# Patient Record
Sex: Female | Born: 1975 | Race: Black or African American | Hispanic: No | Marital: Single | Smoking: Former smoker
Health system: Southern US, Community
[De-identification: ages and names within clinical notes are randomized; demographics above are authoritative.]

## PROBLEM LIST (undated history)

## (undated) ENCOUNTER — Ambulatory Visit: Admission: EM | Payer: No Typology Code available for payment source | Source: Home / Self Care

## (undated) DIAGNOSIS — O00109 Unspecified tubal pregnancy without intrauterine pregnancy: Secondary | ICD-10-CM

## (undated) DIAGNOSIS — A6000 Herpesviral infection of urogenital system, unspecified: Secondary | ICD-10-CM

## (undated) DIAGNOSIS — J302 Other seasonal allergic rhinitis: Secondary | ICD-10-CM

## (undated) DIAGNOSIS — F32A Depression, unspecified: Secondary | ICD-10-CM

## (undated) DIAGNOSIS — J45909 Unspecified asthma, uncomplicated: Secondary | ICD-10-CM

## (undated) DIAGNOSIS — F419 Anxiety disorder, unspecified: Secondary | ICD-10-CM

## (undated) DIAGNOSIS — E7800 Pure hypercholesterolemia, unspecified: Secondary | ICD-10-CM

## (undated) HISTORY — DX: Depression, unspecified: F32.A

## (undated) HISTORY — DX: Other seasonal allergic rhinitis: J30.2

## (undated) HISTORY — PX: WISDOM TOOTH EXTRACTION: SHX21

## (undated) HISTORY — PX: OTHER SURGICAL HISTORY: SHX169

## (undated) HISTORY — DX: Anxiety disorder, unspecified: F41.9

---
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
## 1997-07-08 ENCOUNTER — Observation Stay (HOSPITAL_COMMUNITY): Admission: EM | Admit: 1997-07-08 | Discharge: 1997-07-09 | Payer: Self-pay | Admitting: *Deleted

## 1998-06-10 ENCOUNTER — Inpatient Hospital Stay (HOSPITAL_COMMUNITY): Admission: AD | Admit: 1998-06-10 | Discharge: 1998-06-10 | Payer: Self-pay | Admitting: Obstetrics

## 1998-07-22 ENCOUNTER — Inpatient Hospital Stay (HOSPITAL_COMMUNITY): Admission: AD | Admit: 1998-07-22 | Discharge: 1998-07-22 | Payer: Self-pay | Admitting: Obstetrics

## 1998-07-23 ENCOUNTER — Inpatient Hospital Stay (HOSPITAL_COMMUNITY): Admission: RE | Admit: 1998-07-23 | Discharge: 1998-07-23 | Payer: Self-pay | Admitting: Obstetrics

## 1998-10-03 ENCOUNTER — Inpatient Hospital Stay (HOSPITAL_COMMUNITY): Admission: AD | Admit: 1998-10-03 | Discharge: 1998-10-03 | Payer: Self-pay | Admitting: *Deleted

## 1998-10-11 ENCOUNTER — Inpatient Hospital Stay (HOSPITAL_COMMUNITY): Admission: AD | Admit: 1998-10-11 | Discharge: 1998-10-13 | Payer: Self-pay | Admitting: Obstetrics & Gynecology

## 1998-11-02 ENCOUNTER — Inpatient Hospital Stay (HOSPITAL_COMMUNITY): Admission: AD | Admit: 1998-11-02 | Discharge: 1998-11-04 | Payer: Self-pay | Admitting: *Deleted

## 2001-02-20 HISTORY — PX: OVARIAN CYST REMOVAL: SHX89

## 2004-12-28 ENCOUNTER — Emergency Department: Payer: Self-pay | Admitting: Internal Medicine

## 2005-07-06 ENCOUNTER — Emergency Department: Payer: Self-pay | Admitting: General Practice

## 2005-07-06 ENCOUNTER — Other Ambulatory Visit: Payer: Self-pay

## 2005-07-06 ENCOUNTER — Emergency Department: Payer: Self-pay | Admitting: Unknown Physician Specialty

## 2005-11-17 ENCOUNTER — Emergency Department: Payer: Self-pay | Admitting: Emergency Medicine

## 2005-11-26 ENCOUNTER — Emergency Department: Payer: Self-pay | Admitting: Unknown Physician Specialty

## 2006-04-20 ENCOUNTER — Emergency Department: Payer: Self-pay | Admitting: Emergency Medicine

## 2006-12-12 ENCOUNTER — Emergency Department: Payer: Self-pay | Admitting: Emergency Medicine

## 2007-05-25 ENCOUNTER — Emergency Department (HOSPITAL_COMMUNITY): Admission: EM | Admit: 2007-05-25 | Discharge: 2007-05-25 | Payer: Self-pay | Admitting: Emergency Medicine

## 2008-09-29 ENCOUNTER — Emergency Department: Payer: Self-pay | Admitting: Emergency Medicine

## 2008-10-01 ENCOUNTER — Emergency Department (HOSPITAL_COMMUNITY): Admission: EM | Admit: 2008-10-01 | Discharge: 2008-10-01 | Payer: Self-pay | Admitting: Emergency Medicine

## 2010-11-15 LAB — URINALYSIS, ROUTINE W REFLEX MICROSCOPIC
Bilirubin Urine: NEGATIVE
Ketones, ur: 15 — AB
Protein, ur: 30 — AB
Urobilinogen, UA: 1

## 2010-11-15 LAB — URINE MICROSCOPIC-ADD ON

## 2010-11-15 LAB — DIFFERENTIAL
Basophils Absolute: 0
Basophils Relative: 0
Eosinophils Absolute: 0.6
Monocytes Absolute: 0.5
Neutro Abs: 5.9

## 2010-11-15 LAB — CBC
Hemoglobin: 14
MCHC: 33.8
RDW: 13.1

## 2010-11-15 LAB — RAPID URINE DRUG SCREEN, HOSP PERFORMED
Barbiturates: NOT DETECTED
Benzodiazepines: NOT DETECTED
Cocaine: NOT DETECTED
Opiates: NOT DETECTED

## 2010-11-15 LAB — POCT CARDIAC MARKERS
CKMB, poc: 1.9
Myoglobin, poc: 83.2

## 2013-09-05 ENCOUNTER — Emergency Department: Payer: Self-pay | Admitting: Emergency Medicine

## 2013-11-01 ENCOUNTER — Emergency Department: Payer: Self-pay | Admitting: Emergency Medicine

## 2014-01-15 ENCOUNTER — Emergency Department: Payer: Self-pay | Admitting: Emergency Medicine

## 2014-03-02 ENCOUNTER — Emergency Department: Payer: Self-pay | Admitting: Emergency Medicine

## 2014-04-12 ENCOUNTER — Emergency Department: Payer: Self-pay | Admitting: Emergency Medicine

## 2014-04-13 ENCOUNTER — Emergency Department: Payer: Self-pay | Admitting: Emergency Medicine

## 2014-06-14 ENCOUNTER — Emergency Department
Admit: 2014-06-14 | Disposition: A | Discharge status: Home / Self Care | Payer: Self-pay | Admitting: Emergency Medicine

## 2014-08-13 ENCOUNTER — Encounter: Payer: Self-pay | Admitting: *Deleted

## 2014-08-13 ENCOUNTER — Emergency Department
Admission: EM | Admit: 2014-08-13 | Discharge: 2014-08-13 | Disposition: A | Discharge status: Home / Self Care | Payer: Medicaid Other | Attending: Student | Admitting: Student

## 2014-08-13 DIAGNOSIS — Z720 Tobacco use: Secondary | ICD-10-CM | POA: Diagnosis not present

## 2014-08-13 DIAGNOSIS — N898 Other specified noninflammatory disorders of vagina: Secondary | ICD-10-CM | POA: Diagnosis present

## 2014-08-13 DIAGNOSIS — Z79899 Other long term (current) drug therapy: Secondary | ICD-10-CM | POA: Diagnosis not present

## 2014-08-13 DIAGNOSIS — N760 Acute vaginitis: Secondary | ICD-10-CM | POA: Diagnosis not present

## 2014-08-13 LAB — URINALYSIS COMPLETE WITH MICROSCOPIC (ARMC ONLY)
Bacteria, UA: NONE SEEN
Bilirubin Urine: NEGATIVE
GLUCOSE, UA: NEGATIVE mg/dL
KETONES UR: NEGATIVE mg/dL
Nitrite: NEGATIVE
Protein, ur: NEGATIVE mg/dL
SPECIFIC GRAVITY, URINE: 1.003 — AB (ref 1.005–1.030)
pH: 7 (ref 5.0–8.0)

## 2014-08-13 LAB — WET PREP, GENITAL
Clue Cells Wet Prep HPF POC: NONE SEEN
Trich, Wet Prep: NONE SEEN
Yeast Wet Prep HPF POC: NONE SEEN

## 2014-08-13 LAB — CHLAMYDIA/NGC RT PCR (ARMC ONLY)
Chlamydia Tr: NOT DETECTED
N gonorrhoeae: NOT DETECTED

## 2014-08-13 MED ORDER — METRONIDAZOLE 500 MG PO TABS: 500.0000 mg | ORAL_TABLET | Freq: Two times a day (BID) | ORAL | Status: AC

## 2014-08-13 NOTE — ED Notes (Signed)
Pt states odordous vaginal discharge white in color since Monday, pt states a yeast infection 1 month ago

## 2014-08-13 NOTE — ED Notes (Signed)
Pt reports having a white vaginal discharge with odor since Monday

## 2014-08-13 NOTE — ED Notes (Signed)
Pt alert and oriented X4, active, cooperative, pt in NAD. RR even and unlabored, color WNL.  Pt informed to return if any life threatening symptoms occur.   

## 2014-08-13 NOTE — ED Provider Notes (Signed)
[facility] Emergency Department Provider Note  ____________________________________________  Time seen: Approximately 4:49 PM I have reviewed the triage vital signs and the nursing notes.   HISTORY  Chief Complaint Vaginal Discharge   HPI Robyn Barker is a 39 y.o. female who presents to the ER for vaginal discharge. She has not had a new partner, but states she suspects that he may have had more than 1 partner recently. She reports a foul, white vaginal discharge. She denies abdominal pain, nausea, vomiting, or other concerns.  History reviewed. No pertinent past medical history.  There are no active problems to display for this patient.   History reviewed. No pertinent past surgical history.  Current Outpatient Rx  Name  Route  Sig  Dispense  Refill  . metroNIDAZOLE (FLAGYL) 500 MG tablet   Oral   Take 1 tablet (500 mg total) by mouth 2 (two) times daily.   30 tablet   0     Allergies Review of patient's allergies indicates no known allergies.  No family history on file.  Social History History  Substance Use Topics  . Smoking status: Current Every Day Smoker -- 1.00 packs/day  . Smokeless tobacco: Not on file  . Alcohol Use: No    Review of Systems Constitutional: No fever/chills Cardiovascular: Denies chest pain. Respiratory: Denies shortness of breath or cough. Gastrointestinal: Abdominal pain no., nausea no, vomitingno. Genitourinary: Dysuria no, vaginal discharge yes.. Musculoskeletal: Negative for back pain. Skin: Negative for rash. Neurological: Negative for headaches, focal weakness or numbness.  10-point ROS otherwise negative.  ____________________________________________   PHYSICAL EXAM:  VITAL SIGNS: ED Triage Vitals  Enc Vitals Group     BP 08/13/14 1642 124/72 mmHg     Pulse Rate 08/13/14 1642 78     Resp --      Temp 08/13/14 1642 98.2 F (36.8 C)     Temp Source 08/13/14 1642 Oral     SpO2 08/13/14  1642 99 %     Weight 08/13/14 1642 180 lb (81.647 kg)     Height 08/13/14 1642 5\' 1"  (1.549 m)     Head Cir --      Peak Flow --      Pain Score --      Pain Loc --      Pain Edu? --      Excl. in GC? --     Constitutional: Alert and oriented. Well appearing and in no acute distress. Eyes: Conjunctivae are normal. PERRL. EOMI. Head: Atraumatic. Nose: No congestion/rhinnorhea. Mouth/Throat: Mucous membranes are moist.  Oropharynx non-erythematous. Neck: No stridor. Cardiovascular: Good peripheral circulation. Respiratory: Normal respiratory effort.  No retractions. Gastrointestinal: Soft and nontender. No distention. No abdominal bruits. Genitourinary: Pelvic exam:  Cervix closed, no blood in the vault or from cervix, white discharge present in the vaginal vault, no CMT, no adenexal tenderness, no obvious cervicitis; exterior exam unremarkable Musculoskeletal: No extremity tenderness nor edema.  Neurologic:  Normal speech and language. No gross focal neurologic deficits are appreciated. Speech is normal. No gait instability. Skin:  Skin is warm, dry and intact. No rash noted. Psychiatric: Mood and affect are normal. Speech and behavior are normal.  ____________________________________________   LABS (all labs ordered are listed, but only abnormal results are displayed)  Labs Reviewed  WET PREP, GENITAL - Abnormal; Notable for the following:    WBC, Wet Prep HPF POC MODERATE (*)    All other components within normal limits  URINALYSIS COMPLETEWITH MICROSCOPIC [facility]  ONLY) - Abnormal; Notable for the following:    Color, Urine COLORLESS (*)    APPearance CLEAR (*)    Specific Gravity, Urine 1.003 (*)    Hgb urine dipstick 1+ (*)    Leukocytes, UA 1+ (*)    Squamous Epithelial / LPF 0-5 (*)    All other components within normal limits  CHLAMYDIA/NGC RT PCR ([facility] ONLY)  POC URINE PREG, ED   ____________________________________________  RADIOLOGY  Not  indicated ____________________________________________   PROCEDURES  Procedure(s) performed: Pelvic exam--see assessment  ____________________________________________   INITIAL IMPRESSION / ASSESSMENT AND PLAN / ED COURSE  Pertinent labs & imaging results that were available during my care of the patient were reviewed by me and considered in my medical decision making (see chart for details).  Will await wet prep today. Low concern for GC/Chlamydia.   Results of wet prep were discussed with the patient. She will follow up with the OB/GYN or [facility] Department for symptoms that are not improving with Flagyl.  ____________________________________________   FINAL CLINICAL IMPRESSION(S) / ED DIAGNOSES  Final diagnoses:  Acute vaginitis      [name], FNP 08/13/14 2207  Gayla Doss, MD 08/14/14 0005

## 2014-09-16 ENCOUNTER — Emergency Department
Admission: EM | Admit: 2014-09-16 | Discharge: 2014-09-16 | Disposition: A | Discharge status: Home / Self Care | Payer: Medicaid Other | Attending: Emergency Medicine | Admitting: Emergency Medicine

## 2014-09-16 ENCOUNTER — Encounter: Payer: Self-pay | Admitting: Emergency Medicine

## 2014-09-16 DIAGNOSIS — Z720 Tobacco use: Secondary | ICD-10-CM | POA: Insufficient documentation

## 2014-09-16 DIAGNOSIS — Z113 Encounter for screening for infections with a predominantly sexual mode of transmission: Secondary | ICD-10-CM | POA: Diagnosis not present

## 2014-09-16 DIAGNOSIS — A64 Unspecified sexually transmitted disease: Secondary | ICD-10-CM | POA: Diagnosis present

## 2014-09-16 LAB — URINALYSIS COMPLETE WITH MICROSCOPIC (ARMC ONLY)
Bilirubin Urine: NEGATIVE
GLUCOSE, UA: NEGATIVE mg/dL
KETONES UR: NEGATIVE mg/dL
Nitrite: NEGATIVE
Protein, ur: NEGATIVE mg/dL
SPECIFIC GRAVITY, URINE: 1.026 (ref 1.005–1.030)
pH: 5 (ref 5.0–8.0)

## 2014-09-16 LAB — PREGNANCY, URINE: Preg Test, Ur: NEGATIVE

## 2014-09-16 LAB — CHLAMYDIA/NGC RT PCR (ARMC ONLY)
CHLAMYDIA TR: NOT DETECTED
N gonorrhoeae: NOT DETECTED

## 2014-09-16 NOTE — ED Provider Notes (Addendum)
[facility] Emergency Department Provider Note  ____________________________________________  Time seen: Approximately 5:21 AM  I have reviewed the triage vital signs and the nursing notes.   HISTORY  Chief Complaint SEXUALLY TRANSMITTED DISEASE  Chief complaint from the patient is that her boyfriend states that another woman that he is been with may have chlamydia.   HPI ROSSELYN Barker is a 39 y.o. female denies any medical history. She states that she has not had any medical problems but comes to the ER for evaluation because her boyfriend said that he heard from a friend that another woman that he is with might have chlamydia.  No nausea vomiting. No fevers or chills. Patient does not believe she is pregnant. She is not a vaginal discharge or abdominal pain or other concerns. She states she feels fine and just wants to be tested and only wants treatment if positive.   History reviewed. No pertinent past medical history.  There are no active problems to display for this patient.   History reviewed. No pertinent past surgical history.  No current outpatient prescriptions on file.  Allergies Review of patient's allergies indicates no known allergies.  No family history on file.  Social History History  Substance Use Topics  . Smoking status: Current Every Day Smoker -- 1.00 packs/day  . Smokeless tobacco: Not on file  . Alcohol Use: No    Review of Systems Constitutional: No fever/chills ENT: No sore throat. Cardiovascular: Denies chest pain. Respiratory: Denies shortness of breath. Gastrointestinal: No abdominal pain.  No nausea, no vomiting.  No diarrhea.  No constipation. Genitourinary: Negative for dysuria. Musculoskeletal: Negative for back pain. Skin: Negative for rash.  10-point ROS otherwise negative.  ____________________________________________   PHYSICAL EXAM:  VITAL SIGNS: ED Triage Vitals  Enc Vitals Group     BP  09/16/14 0433 131/90 mmHg     Pulse Rate 09/16/14 0433 86     Resp 09/16/14 0433 18     Temp 09/16/14 0433 98.1 F (36.7 C)     Temp src --      SpO2 09/16/14 0433 99 %     Weight 09/16/14 0433 179 lb (81.194 kg)     Height 09/16/14 0433 5\' 1"  (1.549 m)     Head Cir --      Peak Flow --      Pain Score --      Pain Loc --      Pain Edu? --      Excl. in GC? --     Constitutional: Alert and oriented. Well appearing and in no acute distress. Eyes: Conjunctivae are normal. PERRL. EOMI. Head: Atraumatic. Nose: No congestion/rhinnorhea. Mouth/Throat: Mucous membranes are moist.  Oropharynx non-erythematous. Neck: No stridor.   Cardiovascular: Normal rate, regular rhythm. Grossly normal heart sounds.  Good peripheral circulation. Respiratory: Normal respiratory effort.  No retractions. Lungs CTAB. Gastrointestinal: Soft and nontender. No distention. No abdominal bruits. No CVA tenderness. Musculoskeletal: No lower extremity tenderness nor edema.  No joint effusions. Neurologic:  Normal speech and language. No gross focal neurologic deficits are appreciated. No gait instability. Skin:  Skin is warm, dry and intact. No rash noted. Psychiatric: Mood and affect are normal. Speech and behavior are normal.  ____________________________________________   LABS (all labs ordered are listed, but only abnormal results are displayed)  Labs Reviewed  URINALYSIS COMPLETEWITH MICROSCOPIC ([facility] ONLY) - Abnormal; Notable for the following:    Color, Urine YELLOW (*)    APPearance CLEAR (*)  Hgb urine dipstick 3+ (*)    Leukocytes, UA TRACE (*)    Bacteria, UA RARE (*)    Squamous Epithelial / LPF 0-5 (*)    All other components within normal limits  CHLAMYDIA/NGC RT PCR ([facility] ONLY)  PREGNANCY, URINE    ____________________________________________  EKG   ____________________________________________  RADIOLOGY   ____________________________________________   PROCEDURES  Procedure(s) performed: None  Critical Care performed: No  ____________________________________________   INITIAL IMPRESSION / ASSESSMENT AND PLAN / ED COURSE  Pertinent labs & imaging results that were available during my care of the patient were reviewed by me and considered in my medical decision making (see chart for details).  Patient presents asymptomatic, afebrile for STI evaluation. I discussed with the patient compared treatment because of risk factors and possible exposure, but the patient states that she will not be treated unless she has a positive test for Chlamydia. We discussed the risks and benefits of treatment of antibiotic's, we also discussed the risks of pelvic inflammatory disease, and other severe infections which can result from untreated Chlamydia and that our test alone is not perfect and could miss an infection.  Patient states she is willing to give a urine specimen and we will check a dirty urine for GC. She has given [place] her phone number for follow-up, and should her test be positive she'll return for treatment. We discussed STI prevention and risks of sexual transmitted infections as well as return precautions. Patient is agreeable with the plan.  Again I strongly discussed and recommended treatment based on the history she isn't prevalence of chlamydia and gonorrhea and her population, but she will not undergo treatment unless she has a positive test. It seems reasonable to at least test her, rather than not treat at all so we will send a test and she will receive follow-up should her test be positive.  I discussed that she should follow-up with the [facility] health Department this week for further testing and follow-up. She is agreeable with this plan. Have given her referral  information to the [facility] health Department.  UPT negative. Discharged home. ____________________________________________   FINAL CLINICAL IMPRESSION(S) / ED DIAGNOSES  Final diagnoses:  Screening for STDs (sexually transmitted diseases)      Sharyn Creamer, MD 09/16/14 1610  Sharyn Creamer, MD 09/16/14 [phone]

## 2014-09-16 NOTE — ED Notes (Signed)
Patient ambulatory to triage with steady gait, without difficulty or distress noted; pt reports sexual partner recently dx with chlamydia; pt denies any symptoms

## 2014-09-16 NOTE — ED Notes (Signed)
Pt presents to ED for evaluation d/t recent exposure to STD per partner. Pt reports no symptoms, but was told last Sunday by her sexual partner that she needs to be checked. Pt is A&O, in NAD.

## 2014-09-16 NOTE — Discharge Instructions (Signed)
You have given [place] a sample to check for sexually transmitted infection. We will contact you if her test is positive. Please follow-up with the [facility] health Department within the next 1-2 days for further testing. Our tests are not comprehensive, and you could still have a sexually transmitted infection even if our test is negative so please return right away if he develop any fevers, chills, vaginal discharge, abdominal pain, or other new concerns arise.

## 2014-09-30 ENCOUNTER — Emergency Department (HOSPITAL_COMMUNITY)
Admission: EM | Admit: 2014-09-30 | Discharge: 2014-09-30 | Disposition: A | Discharge status: Home / Self Care | Payer: Medicaid Other | Attending: Emergency Medicine | Admitting: Emergency Medicine

## 2014-09-30 ENCOUNTER — Encounter (HOSPITAL_COMMUNITY): Payer: Self-pay | Admitting: *Deleted

## 2014-09-30 DIAGNOSIS — Z720 Tobacco use: Secondary | ICD-10-CM | POA: Diagnosis not present

## 2014-09-30 DIAGNOSIS — R1032 Left lower quadrant pain: Secondary | ICD-10-CM | POA: Diagnosis present

## 2014-09-30 DIAGNOSIS — Z3202 Encounter for pregnancy test, result negative: Secondary | ICD-10-CM | POA: Insufficient documentation

## 2014-09-30 DIAGNOSIS — R109 Unspecified abdominal pain: Secondary | ICD-10-CM

## 2014-09-30 LAB — BASIC METABOLIC PANEL
Anion gap: 8 (ref 5–15)
BUN: 7 mg/dL (ref 6–20)
CHLORIDE: 104 mmol/L (ref 101–111)
CO2: 25 mmol/L (ref 22–32)
CREATININE: 0.86 mg/dL (ref 0.44–1.00)
Calcium: 9.1 mg/dL (ref 8.9–10.3)
GFR calc Af Amer: >60 mL/min (ref >60)
GFR calc non Af Amer: >60 mL/min (ref >60)
GLUCOSE: 94 mg/dL (ref 65–99)
Potassium: 3.8 mmol/L (ref 3.5–5.1)
Sodium: 137 mmol/L (ref 135–145)

## 2014-09-30 LAB — URINALYSIS, ROUTINE W REFLEX MICROSCOPIC
Bilirubin Urine: NEGATIVE
GLUCOSE, UA: NEGATIVE mg/dL
KETONES UR: NEGATIVE mg/dL
Leukocytes, UA: NEGATIVE
Nitrite: NEGATIVE
PH: 5.5 (ref 5.0–8.0)
PROTEIN: NEGATIVE mg/dL
Specific Gravity, Urine: 1.015 (ref 1.005–1.030)
Urobilinogen, UA: 0.2 mg/dL (ref 0.0–1.0)

## 2014-09-30 LAB — CBC WITH DIFFERENTIAL/PLATELET
Basophils Absolute: 0.1 10*3/uL (ref 0.0–0.1)
Basophils Relative: 1 % (ref 0–1)
EOS ABS: 0.6 10*3/uL (ref 0.0–0.7)
EOS PCT: 5 % (ref 0–5)
HEMATOCRIT: 42.3 % (ref 36.0–46.0)
HEMOGLOBIN: 14.1 g/dL (ref 12.0–15.0)
LYMPHS ABS: 4 10*3/uL (ref 0.7–4.0)
Lymphocytes Relative: 35 % (ref 12–46)
MCH: 30.1 pg (ref 26.0–34.0)
MCHC: 33.3 g/dL (ref 30.0–36.0)
MCV: 90.4 fL (ref 78.0–100.0)
MONO ABS: 0.8 10*3/uL (ref 0.1–1.0)
MONOS PCT: 7 % (ref 3–12)
Neutro Abs: 6.2 10*3/uL (ref 1.7–7.7)
Neutrophils Relative %: 52 % (ref 43–77)
Platelets: 304 10*3/uL (ref 150–400)
RBC: 4.68 MIL/uL (ref 3.87–5.11)
RDW: 13.2 % (ref 11.5–15.5)
WBC: 11.6 10*3/uL — ABNORMAL HIGH (ref 4.0–10.5)

## 2014-09-30 LAB — HIV ANTIBODY (ROUTINE TESTING W REFLEX): HIV SCREEN 4TH GENERATION: NONREACTIVE

## 2014-09-30 LAB — URINE MICROSCOPIC-ADD ON

## 2014-09-30 LAB — GC/CHLAMYDIA PROBE AMP (~~LOC~~) NOT AT ARMC
Chlamydia: NEGATIVE
NEISSERIA GONORRHEA: NEGATIVE

## 2014-09-30 LAB — LIPASE, BLOOD: Lipase: 33 U/L (ref 22–51)

## 2014-09-30 LAB — WET PREP, GENITAL
TRICH WET PREP: NONE SEEN
YEAST WET PREP: NONE SEEN

## 2014-09-30 LAB — POC URINE PREG, ED: PREG TEST UR: NEGATIVE

## 2014-09-30 NOTE — ED Notes (Signed)
Patient stated she is worried about having an STD.

## 2014-09-30 NOTE — Discharge Instructions (Signed)
Abdominal Pain, Women Robyn Barker, your tests will be completed in 3 days. He will be called if they're positive. See a  primary care physician within 3 days for close follow-up. If symptoms worsen come back to the emergency department immediately. Thank you. Abdominal (stomach, pelvic, or belly) pain can be caused by many things. It is important to tell your doctor:  The location of the pain.  Does it come and go or is it present all the time?  Are there things that start the pain (eating certain foods, exercise)?  Are there other symptoms associated with the pain (fever, nausea, vomiting, diarrhea)? All of this is helpful to know when trying to find the cause of the pain. CAUSES   Stomach: virus or bacteria infection, or ulcer.  Intestine: appendicitis (inflamed appendix), regional ileitis (Crohn's disease), ulcerative colitis (inflamed colon), irritable bowel syndrome, diverticulitis (inflamed diverticulum of the colon), or cancer of the stomach or intestine.  Gallbladder disease or stones in the gallbladder.  Kidney disease, kidney stones, or infection.  Pancreas infection or cancer.  Fibromyalgia (pain disorder).  Diseases of the female organs:  Uterus: fibroid (non-cancerous) tumors or infection.  Fallopian tubes: infection or tubal pregnancy.  Ovary: cysts or tumors.  Pelvic adhesions (scar tissue).  Endometriosis (uterus lining tissue growing in the pelvis and on the pelvic organs).  Pelvic congestion syndrome (female organs filling up with blood just before the menstrual period).  Pain with the menstrual period.  Pain with ovulation (producing an egg).  Pain with an IUD (intrauterine device, birth control) in the uterus.  Cancer of the female organs.  Functional pain (pain not caused by a disease, may improve without treatment).  Psychological pain.  Depression. DIAGNOSIS  Your doctor will decide the seriousness of your pain by doing an  examination.  Blood tests.  X-rays.  Ultrasound.  CT scan (computed tomography, special type of X-ray).  MRI (magnetic resonance imaging).  Cultures, for infection.  Barium enema (dye inserted in the large intestine, to better view it with X-rays).  Colonoscopy (looking in intestine with a lighted tube).  Laparoscopy (minor surgery, looking in abdomen with a lighted tube).  Major abdominal exploratory surgery (looking in abdomen with a large incision). TREATMENT  The treatment will depend on the cause of the pain.   Many cases can be observed and treated at home.  Over-the-counter medicines recommended by your caregiver.  Prescription medicine.  Antibiotics, for infection.  Birth control pills, for painful periods or for ovulation pain.  Hormone treatment, for endometriosis.  Nerve blocking injections.  Physical therapy.  Antidepressants.  Counseling with a psychologist or psychiatrist.  Minor or major surgery. HOME CARE INSTRUCTIONS   Do not take laxatives, unless directed by your caregiver.  Take over-the-counter pain medicine only if ordered by your caregiver. Do not take aspirin because it can cause an upset stomach or bleeding.  Try a clear liquid diet (broth or water) as ordered by your caregiver. Slowly move to a bland diet, as tolerated, if the pain is related to the stomach or intestine.  Have a thermometer and take your temperature several times a day, and record it.  Bed rest and sleep, if it helps the pain.  Avoid sexual intercourse, if it causes pain.  Avoid stressful situations.  Keep your follow-up appointments and tests, as your caregiver orders.  If the pain does not go away with medicine or surgery, you may try:  Acupuncture.  Relaxation exercises (yoga, meditation).  Group therapy.  Counseling. SEEK MEDICAL CARE IF:   You notice certain foods cause stomach pain.  Your home care treatment is not helping your pain.  You  need stronger pain medicine.  You want your IUD removed.  You feel faint or lightheaded.  You develop nausea and vomiting.  You develop a rash.  You are having side effects or an allergy to your medicine. SEEK IMMEDIATE MEDICAL CARE IF:   Your pain does not go away or gets worse.  You have a fever.  Your pain is felt only in portions of the abdomen. The right side could possibly be appendicitis. The left lower portion of the abdomen could be colitis or diverticulitis.  You are passing blood in your stools (bright red or black tarry stools, with or without vomiting).  You have blood in your urine.  You develop chills, with or without a fever.  You pass out. MAKE SURE YOU:   Understand these instructions.  Will watch your condition.  Will get help right away if you are not doing well or get worse. Document Released: 12/04/2006 Document Revised: 06/23/2013 Document Reviewed: 12/24/2008 [facility] Patient Information 2015 [place], [place]. This information is not intended to replace advice given to you by your health care provider. Make sure you discuss any questions you have with your health care provider.

## 2014-09-30 NOTE — ED Notes (Signed)
Pt left with belongings and ambulated out of treatment area.  

## 2014-09-30 NOTE — ED Notes (Signed)
Patient presents with c/o left lower quad pain  Last normal BM was yesterday, no vaginal discharge

## 2014-09-30 NOTE — ED Provider Notes (Addendum)
CSN: [id number]     Arrival date & time 09/30/14  0307 History  This chart was scribed for Robyn Crumble, MD by Placido Sou, ED scribe. This patient was seen in room A08C/A08C and the patient's care was started at 3:50 AM.   Chief Complaint  Patient presents with  . Abdominal Pain   The history is provided by the patient. No language interpreter was used.    HPI Comments: Robyn Barker is a 39 y.o. female, with a Hx of smoking and a SHx of an ovarian cyst removal, who presents to the Emergency Department complaining of constant, mild, LLQ pain with onset 2 days ago. She denies taking any medications for her symptoms. Pt denies any fever, chills, vomiting, diarrhea, vaginal bleeding or vaginal discharge. She has concern for STD.  10 Systems reviewed and are negative for acute change except as noted in the HPI.   History reviewed. No pertinent past medical history. Past Surgical History  Procedure Laterality Date  . Ovarian cyst removed     No family history on file. Social History  Substance Use Topics  . Smoking status: Current Every Day Smoker -- 1.00 packs/day  . Smokeless tobacco: Never Used  . Alcohol Use: Yes     Comment: occasionally   OB History    No data available     Review of Systems  Allergies  Review of patient's allergies indicates no known allergies.  Home Medications   Prior to Admission medications   Not on File   BP 109/73 mmHg  Pulse 81  Temp(Src) 98 F (36.7 C) (Oral)  Resp 18  Ht 5\' 1"  (1.549 m)  Wt 178 lb (80.74 kg)  BMI 33.65 kg/m2  SpO2 96%  LMP 09/20/2014 Physical Exam  Constitutional: She is oriented to person, place, and time. She appears well-developed and well-nourished. No distress.  HENT:  Head: Normocephalic and atraumatic.  Nose: Nose normal.  Mouth/Throat: Oropharynx is clear and moist. No oropharyngeal exudate.  Eyes: Conjunctivae and EOM are normal. Pupils are equal, round, and reactive to light. No scleral icterus.   Neck: Normal range of motion. Neck supple. No JVD present. No tracheal deviation present. No thyromegaly present.  Cardiovascular: Normal rate, regular rhythm and normal heart sounds.  Exam reveals no gallop and no friction rub.   No murmur heard. Pulmonary/Chest: Effort normal and breath sounds normal. No respiratory distress. She has no wheezes. She exhibits no tenderness.  Abdominal: Soft. Bowel sounds are normal. She exhibits no distension and no mass. There is tenderness. There is no rebound and no guarding.  LLQ TTP   Genitourinary: Vagina normal and uterus normal. No vaginal discharge found.  Musculoskeletal: Normal range of motion. She exhibits no edema or tenderness.  Lymphadenopathy:    She has no cervical adenopathy.  Neurological: She is alert and oriented to person, place, and time. No cranial nerve deficit. She exhibits normal muscle tone.  Skin: Skin is warm and dry. No rash noted. No erythema. No pallor.  Nursing note and vitals reviewed.   ED Course  Procedures  DIAGNOSTIC STUDIES: Oxygen Saturation is 96% on RA, normal by my interpretation.    COORDINATION OF CARE: 3:52 AM Discussed treatment plan with pt at bedside and pt agreed to plan.  Labs Review Labs Reviewed  WET PREP, GENITAL - Abnormal; Notable for the following:    Clue Cells Wet Prep HPF POC FEW (*)    WBC, Wet Prep HPF POC FEW (*)    All  other components within normal limits  URINALYSIS, ROUTINE W REFLEX MICROSCOPIC (NOT AT [facility]) - Abnormal; Notable for the following:    Hgb urine dipstick MODERATE (*)    All other components within normal limits  CBC WITH DIFFERENTIAL/PLATELET - Abnormal; Notable for the following:    WBC 11.6 (*)    All other components within normal limits  URINE MICROSCOPIC-ADD ON - Abnormal; Notable for the following:    Squamous Epithelial / LPF FEW (*)    Bacteria, UA FEW (*)    All other components within normal limits  BASIC METABOLIC PANEL  LIPASE, BLOOD  HIV ANTIBODY  (ROUTINE TESTING)  POC URINE PREG, ED  GC/CHLAMYDIA PROBE AMP ([place]) NOT AT [facility]    Imaging Review No results found.   EKG Interpretation None      MDM   Final diagnoses:  None    Patient presents to emergency department out of concern for lower abdominal pain. She is concerned for STD. Pelvic exam is unremarkable. Urinalysis and wet prep also unremarkable. I did not believe treatment is indicated. Patient denies her partner having a confirmed infection. She states she will wait to 3 days to see if culture is positive. She otherwise appears well in no acute distress. Vital signs remain within her normal limits and she is safe for discharge.   I personally performed the services described in this documentation, which was scribed in my presence. The recorded information has been reviewed and is accurate.     Robyn Crumble, MD 09/30/14 [id number]

## 2015-04-22 ENCOUNTER — Emergency Department
Admission: EM | Admit: 2015-04-22 | Discharge: 2015-04-22 | Disposition: A | Discharge status: Home / Self Care | Payer: Medicaid Other | Attending: Emergency Medicine | Admitting: Emergency Medicine

## 2015-04-22 DIAGNOSIS — F17200 Nicotine dependence, unspecified, uncomplicated: Secondary | ICD-10-CM | POA: Insufficient documentation

## 2015-04-22 DIAGNOSIS — Z79899 Other long term (current) drug therapy: Secondary | ICD-10-CM | POA: Diagnosis not present

## 2015-04-22 DIAGNOSIS — J020 Streptococcal pharyngitis: Secondary | ICD-10-CM | POA: Insufficient documentation

## 2015-04-22 DIAGNOSIS — J029 Acute pharyngitis, unspecified: Secondary | ICD-10-CM | POA: Diagnosis present

## 2015-04-22 HISTORY — DX: Pure hypercholesterolemia, unspecified: E78.00

## 2015-04-22 LAB — POCT RAPID STREP A: Streptococcus, Group A Screen (Direct): NEGATIVE

## 2015-04-22 MED ORDER — PENICILLIN G BENZATHINE 1200000 UNIT/2ML IM SUSP
1.2000 10*6.[IU] | Freq: Once | INTRAMUSCULAR | Status: AC
  Administered 2015-04-22: 1.2 10*6.[IU] via INTRAMUSCULAR
  Filled 2015-04-22: qty 2

## 2015-04-22 NOTE — ED Notes (Signed)
Pt c/o sore throat onset this am.   No fever

## 2015-04-22 NOTE — ED Provider Notes (Addendum)
[facility] Emergency Department Provider Note  ____________________________________________  Time seen: Approximately 930 PM  I have reviewed the triage vital signs and the nursing notes.   HISTORY  Chief Complaint Sore Throat    HPI Robyn Barker is a 40 y.o. female with a history of hyperlipidemia who is presenting today with sore throat since this morning. She says that she does not have a cough or fever. No associated runny nose, nausea vomiting or diarrhea. No known sick contacts. Is a 20 year smoker and says that she has increased her smoking over this past week due to stress. She says she is smoking a pack and a week.   Past Medical History  Diagnosis Date  . High cholesterol     There are no active problems to display for this patient.   Past Surgical History  Procedure Laterality Date  . Ovarian cyst removed      Current Outpatient Rx  Name  Route  Sig  Dispense  Refill  . Cholecalciferol (VITAMIN D3) 5000 UNITS CAPS   Oral   Take 5,000 Units by mouth daily.         . simvastatin (ZOCOR) 20 MG tablet   Oral   Take 20 mg by mouth daily.           Allergies Review of patient's allergies indicates no known allergies.  No family history on file.  Social History Social History  Substance Use Topics  . Smoking status: Current Every Day Smoker -- 1.00 packs/day for 15 years  . Smokeless tobacco: Never Used  . Alcohol Use: Yes     Comment: occasionally    Review of Systems Constitutional: No fever/chills Eyes: No visual changes. ENT: As above Cardiovascular: Denies chest pain. Respiratory: Denies shortness of breath. Gastrointestinal: No abdominal pain.  No nausea, no vomiting.  No diarrhea.  No constipation. Genitourinary: Negative for dysuria. Musculoskeletal: Negative for back pain. Skin: Negative for rash. Neurological: Negative for headaches, focal weakness or numbness.  10-point ROS otherwise  negative.  ____________________________________________   PHYSICAL EXAM:  VITAL SIGNS: ED Triage Vitals  Enc Vitals Group     BP 04/22/15 2059 124/75 mmHg     Pulse Rate 04/22/15 2059 101     Resp 04/22/15 2059 20     Temp 04/22/15 2059 99 F (37.2 C)     Temp Source 04/22/15 2059 Oral     SpO2 04/22/15 2059 100 %     Weight 04/22/15 2059 180 lb (81.647 kg)     Height 04/22/15 2059  (1.549 m)     Head Cir --      Peak Flow --      Pain Score 04/22/15 2103 6     Pain Loc --      Pain Edu? --      Excl. in GC? --     Constitutional: Alert and oriented. Well appearing and in no acute distress. Eyes: Conjunctivae are normal. PERRL. EOMI. Head: Atraumatic. Nose: No congestion/rhinnorhea. Mouth/Throat: Mucous membranes are moist.  Erythematous pharynx with pus overlying the left tonsil. Neck: No stridor.   Hematological/Lymphatic/Immunilogical: Tender anterior cervical lymphadenopathy bilaterally with the left being greater than the right. Cardiovascular: Normal rate, regular rhythm. Grossly normal heart sounds.  Good peripheral circulation. Respiratory: Normal respiratory effort.  No retractions. Lungs CTAB. Gastrointestinal: Soft and nontender. No distention.  Musculoskeletal: No lower extremity tenderness nor edema.  No joint effusions. Neurologic:  Normal speech and language. No gross focal  neurologic deficits are appreciated. No gait instability. Skin:  Skin is warm, dry and intact. No rash noted. Psychiatric: Mood and affect are normal. Speech and behavior are normal.  ____________________________________________   LABS (all labs ordered are listed, but only abnormal results are displayed)  Labs Reviewed  POCT RAPID STREP A    ____________________________________________  EKG   ____________________________________________  RADIOLOGY   ____________________________________________   PROCEDURES   ____________________________________________   INITIAL IMPRESSION / ASSESSMENT AND PLAN / ED COURSE  Pertinent labs & imaging results that were available during my care of the patient were reviewed by me and considered in my medical decision making (see chart for details).  Rapid strep is negative but patient with a Centor score of a 3 out of 4. We will treat empirically. Also counseled patient to reduce her smoking.  She understands the plan and is willing to comply. ____________________________________________   FINAL CLINICAL IMPRESSION(S) / ED DIAGNOSES  Strep pharyngitis.    Myrna Blazer, MD 04/22/15 2200  Heart rate rechecked and is 87 bpm.  Myrna Blazer, MD 04/22/15 2200

## 2015-04-22 NOTE — Discharge Instructions (Signed)

## 2015-05-14 ENCOUNTER — Emergency Department
Admission: EM | Admit: 2015-05-14 | Discharge: 2015-05-15 | Disposition: A | Discharge status: Home / Self Care | Payer: Medicaid Other | Attending: Emergency Medicine | Admitting: Emergency Medicine

## 2015-05-14 ENCOUNTER — Encounter: Payer: Self-pay | Admitting: Emergency Medicine

## 2015-05-14 DIAGNOSIS — R109 Unspecified abdominal pain: Secondary | ICD-10-CM | POA: Diagnosis present

## 2015-05-14 DIAGNOSIS — O209 Hemorrhage in early pregnancy, unspecified: Secondary | ICD-10-CM

## 2015-05-14 DIAGNOSIS — O200 Threatened abortion: Secondary | ICD-10-CM | POA: Insufficient documentation

## 2015-05-14 DIAGNOSIS — Z3A01 Less than 8 weeks gestation of pregnancy: Secondary | ICD-10-CM | POA: Diagnosis not present

## 2015-05-14 DIAGNOSIS — N939 Abnormal uterine and vaginal bleeding, unspecified: Secondary | ICD-10-CM | POA: Insufficient documentation

## 2015-05-14 DIAGNOSIS — F17210 Nicotine dependence, cigarettes, uncomplicated: Secondary | ICD-10-CM | POA: Diagnosis not present

## 2015-05-14 DIAGNOSIS — E7800 Pure hypercholesterolemia, unspecified: Secondary | ICD-10-CM | POA: Insufficient documentation

## 2015-05-14 LAB — COMPREHENSIVE METABOLIC PANEL
ALK PHOS: 41 U/L (ref 38–126)
ALT: 19 U/L (ref 14–54)
AST: 21 U/L (ref 15–41)
Albumin: 3.9 g/dL (ref 3.5–5.0)
Anion gap: 6 (ref 5–15)
BILIRUBIN TOTAL: 0.4 mg/dL (ref 0.3–1.2)
BUN: 10 mg/dL (ref 6–20)
CALCIUM: 8.9 mg/dL (ref 8.9–10.3)
CO2: 23 mmol/L (ref 22–32)
CREATININE: 0.66 mg/dL (ref 0.44–1.00)
Chloride: 106 mmol/L (ref 101–111)
GFR calc Af Amer: >60 mL/min (ref >60)
Glucose, Bld: 142 mg/dL — ABNORMAL HIGH (ref 65–99)
Potassium: 3.3 mmol/L — ABNORMAL LOW (ref 3.5–5.1)
Sodium: 135 mmol/L (ref 135–145)
TOTAL PROTEIN: 7.2 g/dL (ref 6.5–8.1)

## 2015-05-14 LAB — CBC
HEMATOCRIT: 37.8 % (ref 35.0–47.0)
Hemoglobin: 12.7 g/dL (ref 12.0–16.0)
MCH: 29.5 pg (ref 26.0–34.0)
MCHC: 33.7 g/dL (ref 32.0–36.0)
MCV: 87.6 fL (ref 80.0–100.0)
PLATELETS: 285 10*3/uL (ref 150–440)
RBC: 4.31 MIL/uL (ref 3.80–5.20)
RDW: 13.1 % (ref 11.5–14.5)
WBC: 9.3 10*3/uL (ref 3.6–11.0)

## 2015-05-14 LAB — URINALYSIS COMPLETE WITH MICROSCOPIC (ARMC ONLY)
BACTERIA UA: NONE SEEN
Bilirubin Urine: NEGATIVE
GLUCOSE, UA: NEGATIVE mg/dL
Ketones, ur: NEGATIVE mg/dL
LEUKOCYTES UA: NEGATIVE
Nitrite: NEGATIVE
Protein, ur: NEGATIVE mg/dL
Specific Gravity, Urine: 1.021 (ref 1.005–1.030)
pH: 6 (ref 5.0–8.0)

## 2015-05-14 LAB — HCG, QUANTITATIVE, PREGNANCY: HCG, BETA CHAIN, QUANT, S: 15397 m[IU]/mL — AB (ref <5)

## 2015-05-14 LAB — TYPE AND SCREEN
ABO/RH(D): A POS
ANTIBODY SCREEN: NEGATIVE

## 2015-05-14 LAB — LIPASE, BLOOD: Lipase: 25 U/L (ref 11–51)

## 2015-05-14 NOTE — ED Notes (Signed)
Pt seen by her MD Wednesday for mild abd pain and vaginal bleeding;  told she was about [redacted] weeks pregnant; US done but pt did not get any results; pt presents tonight with lessening vaginal bleeding and worsening abd pain; pt says father of child told her yesterday that he was recently informed by another partner that she has chlamydia; he has not been tested; pt denies vaginal discharge; last BM was yesterday, normal; denies urinary s/s; pt is gravida 4, para 3, living 3;

## 2015-05-14 NOTE — ED Provider Notes (Signed)
[facility] Emergency Department Provider Note  ____________________________________________  Time seen: Approximately 10:39 PM  I have reviewed the triage vital signs and the nursing notes.   HISTORY  Chief Complaint Abdominal Pain   HPI Robyn ConroyFarrah M Barker is a 40 y.o. female reports cramping and bleeding for several days. She went to see Dr. Lacie ScottsNiemeyer who is not her regular doctor had an ultrasound done there but they did not tell her the results. She comes in here tonight because the cramping is worse but the bleeding is better. Cramping is low in the mid abdomen just suprapubically. Patient is about 6 weeks' pregnant or so.Patient has no other medical problems except for high cholesterol.   Past Medical History  Diagnosis Date  . High cholesterol     There are no active problems to display for this patient.   Past Surgical History  Procedure Laterality Date  . Ovarian cyst removed      Current Outpatient Rx  Name  Route  Sig  Dispense  Refill  . Cholecalciferol (VITAMIN D3) 5000 UNITS CAPS   Oral   Take 5,000 Units by mouth daily.         . simvastatin (ZOCOR) 20 MG tablet   Oral   Take 20 mg by mouth daily.           Allergies Review of patient's allergies indicates no known allergies.  History reviewed. No pertinent family history.  Social History Social History  Substance Use Topics  . Smoking status: Current Every Day Smoker -- 1.00 packs/day for 15 years    Types: Cigarettes  . Smokeless tobacco: Never Used  . Alcohol Use: Yes     Comment: occasionally    Review of Systems Constitutional: No fever/chills Eyes: No visual changes. ENT: No sore throat. Cardiovascular: Denies chest pain. Respiratory: Denies shortness of breath. Gastrointestinal:See history of present illness Genitourinary: Negative for dysuria. Musculoskeletal: Negative for back pain. Skin: Negative for rash. Neurological: Negative for headaches, focal  weakness or numbness.  10-point ROS otherwise negative.  ____________________________________________   PHYSICAL EXAM:  VITAL SIGNS: ED Triage Vitals  Enc Vitals Group     BP 05/14/15 2117 146/88 mmHg     Pulse Rate 05/14/15 2117 86     Resp 05/14/15 2117 18     Temp 05/14/15 2117 98 F (36.7 C)     Temp Source 05/14/15 2117 Oral     SpO2 05/14/15 2117 98 %     Weight 05/14/15 2117 183 lb (83.008 kg)     Height 05/14/15 2117 5\' 1"  (1.549 m)     Head Cir --      Peak Flow --      Pain Score 05/14/15 2122 8     Pain Loc --      Pain Edu? --      Excl. in GC? --     Constitutional: Alert and oriented. Well appearing and in no acute distress. Eyes: Conjunctivae are normal. PERRL. EOMI. Head: Atraumatic. Nose: No congestion/rhinnorhea. Mouth/Throat: Mucous membranes are moist.  Oropharynx non-erythematous. Neck: No stridor.   Cardiovascular: Normal rate, regular rhythm. Grossly normal heart sounds.  Good peripheral circulation. Respiratory: Normal respiratory effort.  No retractions. Lungs CTAB. Gastrointestinal: Soft and nontender. No distention. No abdominal bruits. No CVA tenderness. Genitourinary: Normal peritoneum there is some dark blood in the vagina but no active bleeding there is no adnexal tenderness or masses. There is a little bit of cervical motion tenderness but very slight  patient reports. Musculoskeletal: No lower extremity tenderness nor edema.  No joint effusions. Neurologic:  Normal speech and language. No gross focal neurologic deficits are appreciated. No gait instability. Skin:  Skin is warm, dry and intact. No rash noted. Psychiatric: Mood and affect are normal. Speech and behavior are normal.  ____________________________________________   LABS (all labs ordered are listed, but only abnormal results are displayed)  Labs Reviewed  COMPREHENSIVE METABOLIC PANEL - Abnormal; Notable for the following:    Potassium 3.3 (*)    Glucose, Bld 142 (*)     All other components within normal limits  URINALYSIS COMPLETEWITH MICROSCOPIC ([facility] ONLY) - Abnormal; Notable for the following:    Color, Urine YELLOW (*)    APPearance CLEAR (*)    Hgb urine dipstick 3+ (*)    Squamous Epithelial / LPF 0-5 (*)    All other components within normal limits  HCG, QUANTITATIVE, PREGNANCY - Abnormal; Notable for the following:    hCG, Beta Chain, Quant, S 15397 (*)    All other components within normal limits  WET PREP, GENITAL  CHLAMYDIA/NGC RT PCR ([facility] ONLY)  LIPASE, BLOOD  CBC  TYPE AND SCREEN  ABO/RH   ____________________________________________  EKG   ____________________________________________  RADIOLOGY   ____________________________________________   PROCEDURES    ____________________________________________   INITIAL IMPRESSION / ASSESSMENT AND PLAN / ED COURSE  Pertinent labs & imaging results that were available during my care of the patient were reviewed by me and considered in my medical decision making (see chart for details).  Dr. Manson Passey will get the ultrasound report and the wet prep and GC and chlamydia reports. This but will be depending on what we find with these 3. ____________________________________________   FINAL CLINICAL IMPRESSION(S) / ED DIAGNOSES  Final diagnoses:  Vaginal bleeding      Arnaldo Natal, MD 05/15/15 0003

## 2015-05-15 ENCOUNTER — Emergency Department: Payer: Medicaid Other

## 2015-05-15 ENCOUNTER — Encounter (HOSPITAL_COMMUNITY)
Admission: EM | Disposition: A | Discharge status: Home / Self Care | Payer: Self-pay | Source: Ambulatory Visit | Attending: Obstetrics & Gynecology

## 2015-05-15 ENCOUNTER — Inpatient Hospital Stay (HOSPITAL_COMMUNITY): Payer: Medicaid Other | Admitting: Anesthesiology

## 2015-05-15 ENCOUNTER — Inpatient Hospital Stay (HOSPITAL_COMMUNITY): Payer: Medicaid Other

## 2015-05-15 ENCOUNTER — Ambulatory Visit (HOSPITAL_COMMUNITY)
Admission: EM | Admit: 2015-05-15 | Discharge: 2015-05-15 | Disposition: A | Discharge status: Home / Self Care | Payer: Medicaid Other | Source: Ambulatory Visit | Attending: Obstetrics & Gynecology | Admitting: Obstetrics & Gynecology

## 2015-05-15 ENCOUNTER — Encounter (HOSPITAL_COMMUNITY): Payer: Self-pay | Admitting: *Deleted

## 2015-05-15 DIAGNOSIS — N9489 Other specified conditions associated with female genital organs and menstrual cycle: Secondary | ICD-10-CM

## 2015-05-15 DIAGNOSIS — O001 Tubal pregnancy without intrauterine pregnancy: Secondary | ICD-10-CM | POA: Insufficient documentation

## 2015-05-15 DIAGNOSIS — Z6834 Body mass index (BMI) 34.0-34.9, adult: Secondary | ICD-10-CM | POA: Diagnosis not present

## 2015-05-15 DIAGNOSIS — R102 Pelvic and perineal pain: Secondary | ICD-10-CM

## 2015-05-15 DIAGNOSIS — K661 Hemoperitoneum: Secondary | ICD-10-CM | POA: Diagnosis not present

## 2015-05-15 DIAGNOSIS — O00101 Right tubal pregnancy without intrauterine pregnancy: Secondary | ICD-10-CM

## 2015-05-15 HISTORY — PX: SALPINGECTOMY: SHX328

## 2015-05-15 HISTORY — PX: DIAGNOSTIC LAPAROSCOPY WITH REMOVAL OF ECTOPIC PREGNANCY: SHX6449

## 2015-05-15 LAB — WET PREP, GENITAL
Clue Cells Wet Prep HPF POC: NONE SEEN
SPERM: NONE SEEN
Trich, Wet Prep: NONE SEEN
Yeast Wet Prep HPF POC: NONE SEEN

## 2015-05-15 LAB — CBC
HCT: 37.8 % (ref 36.0–46.0)
Hemoglobin: 12.9 g/dL (ref 12.0–15.0)
MCH: 29.9 pg (ref 26.0–34.0)
MCHC: 34.1 g/dL (ref 30.0–36.0)
MCV: 87.7 fL (ref 78.0–100.0)
Platelets: 316 10*3/uL (ref 150–400)
RBC: 4.31 MIL/uL (ref 3.87–5.11)
RDW: 13.2 % (ref 11.5–15.5)
WBC: 13 10*3/uL — ABNORMAL HIGH (ref 4.0–10.5)

## 2015-05-15 LAB — CHLAMYDIA/NGC RT PCR (ARMC ONLY)
CHLAMYDIA TR: NOT DETECTED
N GONORRHOEAE: NOT DETECTED

## 2015-05-15 LAB — ABO/RH: ABO/RH(D): A POS

## 2015-05-15 SURGERY — LAPAROSCOPY, WITH ECTOPIC PREGNANCY SURGICAL TREATMENT
Anesthesia: General | Site: Abdomen

## 2015-05-15 MED ORDER — BUPIVACAINE HCL (PF) 0.25 % IJ SOLN
INTRAMUSCULAR | Status: DC | PRN
  Administered 2015-05-15: 9 mL

## 2015-05-15 MED ORDER — LACTATED RINGERS IV BOLUS (SEPSIS): 125.0000 mL | Freq: Once | INTRAVENOUS | Status: DC

## 2015-05-15 MED ORDER — NEOSTIGMINE METHYLSULFATE 10 MG/10ML IV SOLN
INTRAVENOUS | Status: AC
  Filled 2015-05-15: qty 1

## 2015-05-15 MED ORDER — FAMOTIDINE IN NACL 20-0.9 MG/50ML-% IV SOLN
20.0000 mg | Freq: Once | INTRAVENOUS | Status: AC
  Administered 2015-05-15: 20 mg via INTRAVENOUS
  Filled 2015-05-15: qty 50

## 2015-05-15 MED ORDER — OXYCODONE HCL 5 MG PO TABS
ORAL_TABLET | ORAL | Status: AC
  Filled 2015-05-15: qty 1

## 2015-05-15 MED ORDER — PROPOFOL 10 MG/ML IV BOLUS
INTRAVENOUS | Status: DC | PRN
  Administered 2015-05-15: 190 mg via INTRAVENOUS

## 2015-05-15 MED ORDER — NEOSTIGMINE METHYLSULFATE 10 MG/10ML IV SOLN
INTRAVENOUS | Status: DC | PRN
  Administered 2015-05-15: 4 mg via INTRAVENOUS

## 2015-05-15 MED ORDER — LACTATED RINGERS IV SOLN
INTRAVENOUS | Status: DC
  Administered 2015-05-15 (×2): via INTRAVENOUS

## 2015-05-15 MED ORDER — DEXAMETHASONE SODIUM PHOSPHATE 4 MG/ML IJ SOLN
INTRAMUSCULAR | Status: AC
  Filled 2015-05-15: qty 1

## 2015-05-15 MED ORDER — LIDOCAINE HCL (CARDIAC) 20 MG/ML IV SOLN
INTRAVENOUS | Status: AC
  Filled 2015-05-15: qty 5

## 2015-05-15 MED ORDER — OXYCODONE HCL 5 MG/5ML PO SOLN
5.0000 mg | Freq: Once | ORAL | Status: AC | PRN
Start: 2015-05-15 — End: 2015-05-15

## 2015-05-15 MED ORDER — PROPOFOL 10 MG/ML IV BOLUS
INTRAVENOUS | Status: AC
  Filled 2015-05-15: qty 20

## 2015-05-15 MED ORDER — FENTANYL CITRATE (PF) 250 MCG/5ML IJ SOLN
INTRAMUSCULAR | Status: AC
  Filled 2015-05-15: qty 5

## 2015-05-15 MED ORDER — GLYCOPYRROLATE 0.2 MG/ML IJ SOLN
INTRAMUSCULAR | Status: DC | PRN
  Administered 2015-05-15: .8 mg via INTRAVENOUS

## 2015-05-15 MED ORDER — ACETAMINOPHEN 160 MG/5ML PO SOLN: 325.0000 mg | ORAL | Status: DC | PRN

## 2015-05-15 MED ORDER — CEFOTETAN DISODIUM 2 G IJ SOLR
2.0000 g | INTRAMUSCULAR | Status: AC
  Administered 2015-05-15: 2 g via INTRAVENOUS
  Filled 2015-05-15: qty 2

## 2015-05-15 MED ORDER — ACETAMINOPHEN 325 MG PO TABS: 325.0000 mg | ORAL_TABLET | ORAL | Status: DC | PRN

## 2015-05-15 MED ORDER — MIDAZOLAM HCL 2 MG/2ML IJ SOLN
INTRAMUSCULAR | Status: AC
  Filled 2015-05-15: qty 2

## 2015-05-15 MED ORDER — FENTANYL CITRATE (PF) 100 MCG/2ML IJ SOLN
INTRAMUSCULAR | Status: DC | PRN
  Administered 2015-05-15 (×5): 50 ug via INTRAVENOUS
  Administered 2015-05-15: 100 ug via INTRAVENOUS

## 2015-05-15 MED ORDER — FENTANYL CITRATE (PF) 100 MCG/2ML IJ SOLN: 25.0000 ug | INTRAMUSCULAR | Status: DC | PRN

## 2015-05-15 MED ORDER — FENTANYL CITRATE (PF) 100 MCG/2ML IJ SOLN
INTRAMUSCULAR | Status: AC
  Filled 2015-05-15: qty 2

## 2015-05-15 MED ORDER — ROCURONIUM BROMIDE 100 MG/10ML IV SOLN
INTRAVENOUS | Status: DC | PRN
  Administered 2015-05-15: 5 mg via INTRAVENOUS
  Administered 2015-05-15: 25 mg via INTRAVENOUS

## 2015-05-15 MED ORDER — DEXAMETHASONE SODIUM PHOSPHATE 10 MG/ML IJ SOLN
INTRAMUSCULAR | Status: DC | PRN
Start: 2015-05-15 — End: 2015-05-15
  Administered 2015-05-15: 5 mg via INTRAVENOUS

## 2015-05-15 MED ORDER — ONDANSETRON HCL 4 MG/2ML IJ SOLN
INTRAMUSCULAR | Status: AC
  Filled 2015-05-15: qty 2

## 2015-05-15 MED ORDER — GLYCOPYRROLATE 0.2 MG/ML IJ SOLN
INTRAMUSCULAR | Status: AC
  Filled 2015-05-15: qty 4

## 2015-05-15 MED ORDER — LACTATED RINGERS IR SOLN
Status: DC | PRN
  Administered 2015-05-15: 3000 mL

## 2015-05-15 MED ORDER — MIDAZOLAM HCL 2 MG/2ML IJ SOLN
INTRAMUSCULAR | Status: DC | PRN
  Administered 2015-05-15: 2 mg via INTRAVENOUS

## 2015-05-15 MED ORDER — OXYCODONE-ACETAMINOPHEN 5-325 MG PO TABS
1.0000 | ORAL_TABLET | Freq: Four times a day (QID) | ORAL | Status: DC | PRN
Start: 2015-05-15 — End: 2015-05-21

## 2015-05-15 MED ORDER — OXYCODONE HCL 5 MG PO TABS
5.0000 mg | ORAL_TABLET | Freq: Once | ORAL | Status: AC | PRN
  Administered 2015-05-15: 5 mg via ORAL

## 2015-05-15 MED ORDER — ROCURONIUM BROMIDE 100 MG/10ML IV SOLN
INTRAVENOUS | Status: AC
  Filled 2015-05-15: qty 1

## 2015-05-15 MED ORDER — SUCCINYLCHOLINE CHLORIDE 20 MG/ML IJ SOLN
INTRAMUSCULAR | Status: DC | PRN
  Administered 2015-05-15: 120 mg via INTRAVENOUS

## 2015-05-15 MED ORDER — ONDANSETRON HCL 4 MG/2ML IJ SOLN
INTRAMUSCULAR | Status: DC | PRN
  Administered 2015-05-15: 4 mg via INTRAVENOUS

## 2015-05-15 MED ORDER — CITRIC ACID-SODIUM CITRATE 334-500 MG/5ML PO SOLN
30.0000 mL | Freq: Once | ORAL | Status: AC
  Administered 2015-05-15: 30 mL via ORAL
  Filled 2015-05-15: qty 15

## 2015-05-15 SURGICAL SUPPLY — 28 items
CABLE HIGH FREQUENCY MONO STRZ (ELECTRODE) IMPLANT
CATH ROBINSON RED A/P 16FR (CATHETERS) IMPLANT
CHLORAPREP W/TINT 26ML (MISCELLANEOUS) ×2 IMPLANT
CLOTH BEACON ORANGE TIMEOUT ST (SAFETY) ×2 IMPLANT
DRSG COVADERM PLUS 2X2 (GAUZE/BANDAGES/DRESSINGS) IMPLANT
DRSG OPSITE POSTOP 3X4 (GAUZE/BANDAGES/DRESSINGS) ×2 IMPLANT
GLOVE BIOGEL PI IND STRL 7.0 (GLOVE) ×2 IMPLANT
GLOVE BIOGEL PI INDICATOR 7.0 (GLOVE) ×2
GLOVE ECLIPSE 7.0 STRL STRAW (GLOVE) ×4 IMPLANT
GOWN STRL REUS W/TWL LRG LVL3 (GOWN DISPOSABLE) ×6 IMPLANT
NS IRRIG 1000ML POUR BTL (IV SOLUTION) ×2 IMPLANT
PACK LAPAROSCOPY BASIN (CUSTOM PROCEDURE TRAY) ×2 IMPLANT
PAD TRENDELENBURG POSITION (MISCELLANEOUS) ×2 IMPLANT
POUCH SPECIMEN RETRIEVAL 10MM (ENDOMECHANICALS) IMPLANT
SET IRRIG TUBING LAPAROSCOPIC (IRRIGATION / IRRIGATOR) ×2 IMPLANT
SHEARS HARMONIC ACE PLUS 36CM (ENDOMECHANICALS) IMPLANT
SLEEVE XCEL OPT CAN 5 100 (ENDOMECHANICALS) IMPLANT
SUT VIC AB 3-0 X1 27 (SUTURE) ×2 IMPLANT
SUT VICRYL 0 UR6 27IN ABS (SUTURE) ×4 IMPLANT
SUT VICRYL 4-0 PS2 18IN ABS (SUTURE) ×2 IMPLANT
TOWEL OR 17X24 6PK STRL BLUE (TOWEL DISPOSABLE) ×4 IMPLANT
TRAY FOLEY CATH SILVER 14FR (SET/KITS/TRAYS/PACK) ×2 IMPLANT
TROCAR BALLN 12MMX100 BLUNT (TROCAR) ×2 IMPLANT
TROCAR OPTI TIP 5M 100M (ENDOMECHANICALS) IMPLANT
TROCAR XCEL DIL TIP R 11M (ENDOMECHANICALS) IMPLANT
TROCAR XCEL NON-BLD 5MMX100MML (ENDOMECHANICALS) ×4 IMPLANT
WARMER LAPAROSCOPE (MISCELLANEOUS) ×2 IMPLANT
WATER STERILE IRR 1000ML POUR (IV SOLUTION) ×2 IMPLANT

## 2015-05-15 NOTE — Anesthesia Preprocedure Evaluation (Signed)
Anesthesia Evaluation  Patient identified by MRN, date of birth, ID band Patient awake    Reviewed: Allergy & Precautions, NPO status , Patient's Chart, lab work & pertinent test results  Airway Mallampati: II  TM Distance: >3 FB Neck ROM: Full    Dental  (+) Teeth Intact   Pulmonary neg shortness of breath, neg sleep apnea, neg recent URI, Current Smoker,    breath sounds clear to auscultation       Cardiovascular negative cardio ROS   Rhythm:Regular     Neuro/Psych negative neurological ROS  negative psych ROS   GI/Hepatic negative GI ROS, Neg liver ROS,   Endo/Other  Morbid obesity  Renal/GU negative Renal ROS     Musculoskeletal   Abdominal   Peds  Hematology   Anesthesia Other Findings   Reproductive/Obstetrics (+) Pregnancy Ectopic presumed                             Anesthesia Physical Anesthesia Plan  ASA: II and emergent  Anesthesia Plan: General   Post-op Pain Management:    Induction: Intravenous  Airway Management Planned: Oral ETT  Additional Equipment: None  Intra-op Plan:   Post-operative Plan: Extubation in OR  Informed Consent: I have reviewed the patients History and Physical, chart, labs and discussed the procedure including the risks, benefits and alternatives for the proposed anesthesia with the patient or authorized representative who has indicated his/her understanding and acceptance.   Dental advisory given  Plan Discussed with: CRNA and Surgeon  Anesthesia Plan Comments:         Anesthesia Quick Evaluation

## 2015-05-15 NOTE — Op Note (Signed)
Earnest ConroyFarrah M Belvin  PROCEDURE DATE: 05/15/2015  PREOPERATIVE DIAGNOSIS: Ruptured living ectopic pregnancy  POSTOPERATIVE DIAGNOSIS: Ruptured right fallopian tube ectopic pregnancy  PROCEDURE: Laparoscopic right salpingectomy   SURGEON:  Dr. Tinnie Gensanya Pratt  ASSISTANT: Candelaria CelesteJacob Stinson, MD  ANESTHESIOLOGIST: Val Eaglehristopher Moser, MD - GET[facility]  INDICATIONS: 40 y.o. [phone] at [id number] here for with ruptured ectopic pregnancy with blood type A pos. Patient was counseled regarding need for laparoscopic salpingectomy. Risks of surgery including bleeding which may require transfusion or reoperation, infection, injury to bowel or other surrounding organs, need for additional procedures including laparotomy and other postoperative/anesthesia complications were explained to patient.  Written informed consent was obtained.  FINDINGS:  Moderate amount of hemoperitoneum estimated to be about 250 cc of blood and clots.  Dilated right fallopian tube containing ectopic gestation. Small normal appearing uterus, normal left fallopian tube, right ovary and left ovary.  ANESTHESIA: General  SPECIMENS: right fallopian tube to pathology  COMPLICATIONS: None immediate  PROCEDURE IN DETAIL:  The patient was taken to the operating room where general anesthesia was administered and was found to be adequate.  She was placed in the dorsal lithotomy position, and was prepped and draped in a sterile manner.  A Foley catheter was inserted into her bladder and attached to constant drainage and a uterine manipulator was then advanced into the uterus .  After an adequate timeout was performed, attention was then turned to the patient's abdomen where a 10-mm skin incision was made on the umbilical fold. Fascia and peritoneum were entered sharply.  One 0 Vicryl sutures were used to tag the fascia circumferentially.  A Hassan trocar was placed. The laparoscope was introduced.  A survey of the patient's pelvis and abdomen revealed the findings  as above.  Bilateral lower quadrant ports were placed under direct visualization; 5-mm on the left and 5-mm on the right.  Attention was then turned to the right fallopian tube which was grasped and ligated from the underlying mesosalpinx and uterine attachment using the harmonic instrument.  Good hemostasis was noted.  The specimen was placed in an EndoCatch bag and removed from the abdomen intact.  The abdomen was desufflated, and all instruments were removed.  The umbilicus incision was closed with the afore mentioned Vicryl suture; and all skin incisions were closed with a 3-0 Vicryl subcuticular stitch. Liqui-band applied. Hulka tenaculum removed from uterus. The patient tolerated the procedures well.  All instruments, needles, and sponge counts were correct x 2. The patient was taken to the recovery room in stable condition.   PRATT,TANYA S MD 05/15/2015 8:20 AM

## 2015-05-15 NOTE — ED Provider Notes (Signed)
Assumed care of the patient at 12:00 AM from Dr. Juliette Alcide. Ultrasound revealed US OB Comp Less 14 Wks (Final result) Result time: 05/15/15 02:02:52   Final result by Rad Results In Interface (05/15/15 02:02:52)   Narrative:   CLINICAL DATA: Vaginal bleeding with lower abdominal pain and cramping for 5 days. Estimated gestational age by LMP is 5 weeks 0 days. Quantitative beta HCG is 15,397.  EXAM: OBSTETRIC <14 WK [place] AND TRANSVAGINAL OB US  TECHNIQUE: Both transabdominal and transvaginal ultrasound examinations were performed for complete evaluation of the gestation as well as the maternal uterus, adnexal regions, and pelvic cul-de-sac. Transvaginal technique was performed to assess early pregnancy.  COMPARISON: None.  FINDINGS: Intrauterine gestational sac: No intrauterine gestational sac is visualized.  Yolk sac: Not identified.  Embryo: Not identified.  Cardiac Activity: Not identified.  Maternal uterus/adnexae: The uterus is anteverted. There is a posterior hypoechoic myometrial mass lesion measured about 2 cm maximal diameter. This is consistent with a fibroid. The endometrium is thickened at 2.4 cm with heterogeneous fluid in the endometrium. Changes are nonspecific. Decidual reaction, endometrial blood products, or retained products of conception could potentially have this appearance. The left ovary is not visualized. Limited visualization of the right ovary. There is a somewhat heterogeneous cyst in the right ovary measuring about 2.1 cm maximal diameter. Tubular cystic structure in the right adnexa may represent hydrosalpinx or adjacent cyst. Small amount of free fluid in the pelvis.  IMPRESSION: No intrauterine pregnancy is identified. Heterogeneous endometrial thickening with fluid. Nonspecific cystic structures in the right adnexal regions. No intrauterine gestational sac, yolk sac, or fetal pole identified. Differential considerations include  intrauterine pregnancy too early to be sonographically visualized, missed abortion, or ectopic pregnancy. Followup ultrasound is recommended in 10-14 days for further evaluation.   Electronically Signed By: Burman Nieves M.D. On: 05/15/2015 02:02          US OB Transvaginal (Final result) Result time: 05/15/15 02:02:52   Final result by Rad Results In Interface (05/15/15 02:02:52)   Narrative:   CLINICAL DATA: Vaginal bleeding with lower abdominal pain and cramping for 5 days. Estimated gestational age by LMP is 5 weeks 0 days. Quantitative beta HCG is 15,397.  EXAM: OBSTETRIC <14 WK [place] AND TRANSVAGINAL OB US  TECHNIQUE: Both transabdominal and transvaginal ultrasound examinations were performed for complete evaluation of the gestation as well as the maternal uterus, adnexal regions, and pelvic cul-de-sac. Transvaginal technique was performed to assess early pregnancy.  COMPARISON: None.  FINDINGS: Intrauterine gestational sac: No intrauterine gestational sac is visualized.  Yolk sac: Not identified.  Embryo: Not identified.  Cardiac Activity: Not identified.  Maternal uterus/adnexae: The uterus is anteverted. There is a posterior hypoechoic myometrial mass lesion measured about 2 cm maximal diameter. This is consistent with a fibroid. The endometrium is thickened at 2.4 cm with heterogeneous fluid in the endometrium. Changes are nonspecific. Decidual reaction, endometrial blood products, or retained products of conception could potentially have this appearance. The left ovary is not visualized. Limited visualization of the right ovary. There is a somewhat heterogeneous cyst in the right ovary measuring about 2.1 cm maximal diameter. Tubular cystic structure in the right adnexa may represent hydrosalpinx or adjacent cyst. Small amount of free fluid in the pelvis.  IMPRESSION: No intrauterine pregnancy is identified. Heterogeneous  endometrial thickening with fluid. Nonspecific cystic structures in the right adnexal regions. No intrauterine gestational sac, yolk sac, or fetal pole identified. Differential considerations include intrauterine pregnancy too early to be sonographically  visualized, missed abortion, or ectopic pregnancy. Followup ultrasound is recommended in 10-14 days for further evaluation.   Electronically Signed By: Burman NievesWilliam Stevens M.D. On: 05/15/2015 02:02      I spoke with the patient at length informing her of the ultrasound findings and the necessity of having a repeat ultrasound performed. Patient is referred to Dr. Valentino Saxonherry for further outpatient evaluation and management. The patient is advised repetitively to return to the emergency department if any pain or worsening bleeding.  Darci Currentandolph N Jarquis Walker, MD 05/15/15 [id number]

## 2015-05-15 NOTE — Discharge Instructions (Signed)
DISCHARGE INSTRUCTIONS: Laparoscopy  The following instructions have been prepared to help you care for yourself upon your return home today.  Wound care:  Do not get the incision wet for the first 24 hours. The incision should be kept clean and dry.  The Band-Aids or dressings may be removed the day after surgery.  Should the incision become sore, red, and swollen after the first week, check with your doctor.  Personal hygiene:  Shower the day after your procedure.  Activity and limitations:  Do NOT drive or operate any equipment today.  Do NOT lift anything more than 15 pounds for 2-3 weeks after surgery.  Do NOT rest in bed all day.  Walking is encouraged. Walk each day, starting slowly with 5-minute walks 3 or 4 times a day. Slowly increase the length of your walks.  Walk up and down stairs slowly.  Do NOT do strenuous activities, such as golfing, playing tennis, bowling, running, biking, weight lifting, gardening, mowing, or vacuuming for 2-4 weeks. Ask your doctor when it is okay to start.  Diet: Eat a light meal as desired this evening. You may resume your usual diet tomorrow.  Return to work: This is dependent on the type of work you do. For the most part you can return to a desk job within a week of surgery. If you are more active at work, please discuss this with your doctor.  What to expect after your surgery: You may have a slight burning sensation when you urinate on the first day. You may have a very small amount of blood in the urine. Expect to have a small amount of vaginal discharge/light bleeding for 1-2 weeks. It is not unusual to have abdominal soreness and bruising for up to 2 weeks. You may be tired and need more rest for about 1 week. You may experience shoulder pain for 24-72 hours. Lying flat in bed may relieve it.  Call your doctor for any of the following:  Develop a fever of 100.4 or greater  Inability to urinate 6 hours after discharge from  hospital  Severe pain not relieved by pain medications  Persistent of heavy bleeding at incision site  Redness or swelling around incision site after a week  Increasing nausea or vomiting   Ruptured Ectopic Pregnancy An ectopic pregnancy is when the fertilized egg attaches (implants) outside the uterus. Most ectopic pregnancies occur in the fallopian tube. Rarely do ectopic pregnancies occur on the ovary, intestine, pelvis, or cervix. An ectopic pregnancy does not have the ability to develop into a normal, healthy baby.  A ruptured ectopic pregnancy is one in which the fallopian tube gets torn or bursts and results in internal bleeding. Often there is intense abdominal pain, and sometimes, vaginal bleeding. Having an ectopic pregnancy can be a life-threatening experience. If left untreated, this dangerous condition can lead to a blood transfusion, abdominal surgery, or even death. Laparoscopic Ovarian Torsion Surgery, Care After Refer to this sheet in the next few weeks. These instructions provide you with information on caring for yourself after your procedure. Your caregiver may also give you more specific instructions. Your treatment has been planned according to current medical practices, but problems sometimes occur. Call your caregiver if you have any problems or questions after your procedure. HOME CARE INSTRUCTIONS  Take any medicine as directed by your caregiver. Follow the directions carefully.  Check your incisions every day.  Keep the incision area(s) dry. Ask your caregiver when it is safe to shower  or bathe again.  Rest as much as possible for the next 3 days. Ask your caregiver when it is safe to go back to your normal activities.  Drink enough fluids to keep your urine clear or pale yellow.  Keep all follow-up appointments. Your caregiver will make sure you are healing the way you should be. SEEK MEDICAL CARE IF:   You have bleeding or discharge from your vagina.  You  have pain in your abdomen.  You feel nauseous. SEEK IMMEDIATE MEDICAL CARE IF:   Your incision(s) becomes red, swollen, or tender.  Your incision(s) start(s) bleeding.  You have pus coming from any incision.  You have heavy or persistent vaginal bleeding or discharge.  You have severe or increased abdominal pain.  You cannot stop vomiting.  Your nausea will not go away.  You have a fever. MAKE SURE YOU:  Understand these instructions.  Watch your condition.  Get help right away if you are not doing well or get worse.   This information is not intended to replace advice given to you by your health care provider. Make sure you discuss any questions you have with your health care provider.   Document Released: 01/26/2011 Document Revised: 02/27/2014 Document Reviewed: 01/26/2011 Elsevier Interactive Patient Education 2016 [employer]  CAUSES  Damage to the fallopian tubes is the suspected cause in most ectopic pregnancies.  RISK FACTORS Depending on your circumstances, the amount of risk of having an ectopic pregnancy will vary. There are 3 categories that may help you identify whether you are potentially at risk. High Risk  You have gone through infertility treatment.  You have had a previous ectopic pregnancy.  You have had previous tubal surgery.  You have had previous surgery to have the fallopian tubes tied (tubal ligation).  You have tubal problems or diseases.  You have been exposed to DES. DES is a medicine that was used until 1971 and had effects on babies whose mothers took the medicine.  You become pregnant while using an intrauterine device (IUD) for birth control. Moderate Risk  You have a history of infertility.  You have a history of a sexually transmitted infection (STI).  You have a history of pelvic inflammatory disease (PID).  You have scarring from endometriosis.  You have multiple sexual partners.  You smoke. Low Risk  You have  had previous pelvic surgery.  You use vaginal douching.  You became sexually active before 40 years of age. SYMPTOMS An ectopic pregnancy should be suspected in anyone who has missed a period and has abdominal pain or bleeding.  You may experience normal pregnancy symptoms, such as:  Nausea.  Tiredness.  Breast tenderness.  Symptoms that are not normal include:  Pain with intercourse.  Irregular vaginal bleeding or spotting.  Cramping or pain on one side, or in the lower abdomen.  Fast heartbeat.  Passing out while having a bowel movement.  Symptoms of a ruptured ectopic pregnancy and internal bleeding may include:  Sudden, severe pain in the abdomen and pelvis.  Dizziness or fainting.  Pain in the shoulder area. DIAGNOSIS  Tests that may be performed include:  A pregnancy test.  An ultrasound.  Testing the specific level of pregnancy hormone in the bloodstream.  Taking a sample of uterus tissue (dilation and curettage, D&C).  Surgery to perform a visual exam of the inside of the abdomen using a lighted tube (laparoscopy). TREATMENT  Laparoscopic surgery or abdominal surgery is recommended for a ruptured ectopic pregnancy.  The whole fallopian tube may need to be removed (salpingectomy).  If the tube is not too damaged, the tube may be saved, and the pregnancy will be surgically removed. Intime, the tube may still function.  If you have lost a lot of blood, you may need a blood transfusion.  You may receive a Rho (D) immune globulin shot if you are Rh negative and the father is Rh positive, or if you do not know the Rh type of the father. This is to prevent problems with any future pregnancy. SEEK IMMEDIATE MEDICAL CARE IF:  You have any symptoms of an ectopic or ruptured ectopic pregnancy. This is a medical emergency. MAKE SURE YOU:  Understand these instructions.  Will watch your condition.  Will get help right away if you are not doing well or get  worse.   This information is not intended to replace advice given to you by your health care provider. Make sure you discuss any questions you have with your health care provider.   Document Released: 02/04/2000 Document Revised: 02/11/2013 Document Reviewed: 11/18/2012 Elsevier Interactive Patient Education 2016 [employer] Laparoscopic Ectopic Surgery, Care After Refer to this sheet in the next few weeks. These instructions provide you with information on caring for yourself after your procedure. Your caregiver may also give you more specific instructions. Your treatment has been planned according to current medical practices, but problems sometimes occur. Call your caregiver if you have any problems or questions after your procedure. HOME CARE INSTRUCTIONS  Take any medicine as directed by your caregiver. Follow the directions carefully.  Check your incisions every day.  Keep the incision area(s) dry. Ask your caregiver when it is safe to shower or bathe again.  Rest as much as possible for the next 3 days. Ask your caregiver when it is safe to go back to your normal activities.  Drink enough fluids to keep your urine clear or pale yellow.  Keep all follow-up appointments. Your caregiver will make sure you are healing the way you should be. SEEK MEDICAL CARE IF:   You have bleeding or discharge from your vagina.  You have pain in your abdomen.  You feel nauseous. SEEK IMMEDIATE MEDICAL CARE IF:   Your incision(s) becomes red, swollen, or tender.  Your incision(s) start(s) bleeding.  You have pus coming from any incision.  You have heavy or persistent vaginal bleeding or discharge.  You have severe or increased abdominal pain.  You cannot stop vomiting.  Your nausea will not go away.  You have a fever. MAKE SURE YOU:  Understand these instructions.  Watch your condition.  Get help right away if you are not doing well or get worse.   This information is not  intended to replace advice given to you by your health care provider. Make sure you discuss any questions you have with your health care provider.   Document Released: 01/26/2011 Document Revised: 02/27/2014 Document Reviewed: 01/26/2011 Elsevier Interactive Patient Education [employer]

## 2015-05-15 NOTE — Interval H&P Note (Signed)
History and Physical Interval Note:  05/15/2015 7:03 AM  Robyn Barker  has presented today for surgery, with the diagnosis of living ectopic  The various methods of treatment have been discussed with the patient and family. After consideration of risks, benefits and other options for treatment, the patient has consented to  Procedure(s): DIAGNOSTIC LAPAROSCOPY WITH REMOVAL OF ECTOPIC PREGNANCY (N/A) as a surgical intervention .  The patient's history has been reviewed, patient examined, no change in status, stable for surgery.  I have reviewed the patient's chart and labs.  Questions were answered to the patient's satisfaction.  Risks include but are not limited to bleeding, infection, injury to surrounding structures, including bowel, bladder and ureters, blood clots, and death.  Likelihood of success is high.    Ermina Oberman S

## 2015-05-15 NOTE — Transfer of Care (Signed)
Immediate Anesthesia Transfer of Care Note  Patient: Robyn ConroyFarrah M Lascano  Procedure(s) Performed: Procedure(s): DIAGNOSTIC LAPAROSCOPY WITH REMOVAL OF ECTOPIC PREGNANCY (N/A)  Patient Location: PACU  Anesthesia Type:General  Level of Consciousness: awake and alert   Airway & Oxygen Therapy: Patient Spontanous Breathing and Patient connected to nasal cannula oxygen  Post-op Assessment: Report given to RN and Post -op Vital signs reviewed and stable  Post vital signs: Reviewed and stable  Last Vitals:  Filed Vitals:   05/15/15 0559 05/15/15 0701  BP: 121/61 111/72  Pulse: 78 72  Temp:    Resp: 20 18    Complications: No apparent anesthesia complications

## 2015-05-15 NOTE — MAU Note (Signed)
Having abd pain since Sunday. [facility]. Spotting since Sunday. Seen at [facility] tonight. Had labs and u/s. Pain worse now and blood all in toilet when left hospital

## 2015-05-15 NOTE — Anesthesia Postprocedure Evaluation (Signed)
Anesthesia Post Note  Patient: Earnest ConroyFarrah M Brager  Procedure(s) Performed: Procedure(s) (LRB): DIAGNOSTIC LAPAROSCOPY WITH REMOVAL OF ECTOPIC PREGNANCY (N/A)  Patient location during evaluation: PACU Anesthesia Type: General Level of consciousness: awake Pain management: pain level controlled Vital Signs Assessment: post-procedure vital signs reviewed and stable Respiratory status: spontaneous breathing and respiratory function stable Cardiovascular status: stable Postop Assessment: no signs of nausea or vomiting Anesthetic complications: no    Last Vitals:  Filed Vitals:   05/15/15 1215 05/15/15 1230  BP: 124/83 120/87  Pulse: 76 73  Temp:  36.8 C  Resp: 18 16    Last Pain:  Filed Vitals:   05/15/15 1254  PainSc: 6                  Slade Pierpoint

## 2015-05-15 NOTE — Anesthesia Procedure Notes (Signed)
Procedure Name: Intubation Date/Time: 05/15/2015 7:29 AM Performed by: Mckenzie Toruno G Pre-anesthesia Checklist: Patient identified, Timeout performed, Emergency Drugs available, Suction available and Patient being monitored Patient Re-evaluated:Patient Re-evaluated prior to inductionOxygen Delivery Method: Circle system utilized Preoxygenation: Pre-oxygenation with 100% oxygen Intubation Type: IV induction Laryngoscope Size: Mac and 3 Grade View: Grade I Tube type: Oral Tube size: 7.0 mm Number of attempts: 1 Airway Equipment and Method: Stylet Placement Confirmation: ETT inserted through vocal cords under direct vision,  positive ETCO2 and breath sounds checked- equal and bilateral Secured at: 21 cm Tube secured with: Tape Dental Injury: Teeth and Oropharynx as per pre-operative assessment

## 2015-05-15 NOTE — H&P (Signed)
History     CSN: [id number]  Arrival date and time: 05/15/15 4098  First Provider Initiated Contact with Patient 05/15/15 [phone]    Chief Complaint  Patient presents with  . Abdominal Pain   HPI   [name] is a 40 y.o. female [phone] @ [redacted]w[redacted]d presenting to MAU with severe abdominal pain. The patient left [facility] shortly to her arrival here to MAU. She was told that her beta hormone level is high (>15,000) and they were unable to find and IUP. She was instructed to come to MAU if her pain worsened. The patient says shortly after leaving [facility] her pain in the right-mid lower quadrant became severe; she currently rates her pain 10/10. The pain is constant and sharp at times. She rates her pain 10/10.  OB History    Gravida Para Term Preterm AB TAB SAB Ectopic Multiple Living   Past Medical History  Diagnosis Date  . High cholesterol     Past Surgical History  Procedure Laterality Date  . Ovarian cyst removed      History reviewed. No pertinent family history.  Social History  Substance Use Topics  . Smoking status: Current Every Day Smoker -- 1.00 packs/day for 15 years    Types: Cigarettes  . Smokeless tobacco: Never Used  . Alcohol Use: Yes     Comment: occasionally    Allergies: No Known Allergies  Prescriptions prior to admission  Medication Sig Dispense Refill Last Dose  . acetaminophen (TYLENOL) 500 MG tablet Take 1,000 mg by mouth every 6 (six) hours as needed.   05/14/2015 at 2030  . Cholecalciferol (VITAMIN D3) 5000 UNITS CAPS Take 5,000 Units by mouth daily.   Past Week at Unknown time  . simvastatin (ZOCOR) 20 MG tablet Take 20 mg by mouth daily.   Past Week at Unknown time    Lab Results Last 48 Hours    Results for orders placed or performed during the hospital encounter of 05/15/15 (from the past 48 hour(s))  CBC Status:  Abnormal   Collection Time: 05/15/15 4:43 AM  Result Value Ref Range   WBC 13.0 (H) 4.0 - 10.5 K/uL   RBC 4.31 3.87 - 5.11 MIL/uL   Hemoglobin 12.9 12.0 - 15.0 g/dL   HCT 21.3 08.6 - 57.8 %   MCV 87.7 78.0 - 100.0 fL   MCH 29.9 26.0 - 34.0 pg   MCHC 34.1 30.0 - 36.0 g/dL   RDW 46.9 62.9 - 52.8 %   Platelets 316 150 - 400 K/uL      Imaging Results (Last 48 hours)    US Ob Comp Less 14 Wks  05/15/2015 CLINICAL DATA: 40 year old female with positive HCG levels presenting with vaginal bleeding and lower abdominal cramping. EXAM: OBSTETRIC <14 WK [place] AND TRANSVAGINAL OB US TECHNIQUE: Both transabdominal and transvaginal ultrasound examinations were performed for complete evaluation of the gestation as well as the maternal uterus, adnexal regions, and pelvic cul-de-sac. Transvaginal technique was performed to assess early pregnancy. COMPARISON: Earlier ultrasound dated 05/15/2015 FINDINGS: The uterus is anteverted. No intrauterine pregnancy identified. There is a 5.3 x 5.2 x 4.1 cm heterogeneous mass in the region of the right adnexa anterior to the right ovary. There appears to be a gestational sac with a fetal pole within this mass. There is positive fetal cardiac activity with a heart rate of 108 beats per minute. The estimated gestational age based  on crown-rump length of 3 mm is 5 weeks, 6 days. Small amount of free fluid noted within the pelvis. IMPRESSION: Right adnexal ectopic pregnancy with an estimated gestational age of [redacted] weeks, 6 days and positive cardiac activity of 108 beats per minute. Obstetrical consult is advised. Electronically Signed By: Elgie Collard M.D. On: 05/15/2015 06:17   US Ob Comp Less 14 Wks  05/15/2015 CLINICAL DATA: Vaginal bleeding with lower abdominal pain and cramping for 5 days. Estimated gestational age by LMP is 5 weeks 0 days. Quantitative beta HCG is 15,397. EXAM: OBSTETRIC <14 WK [place] AND TRANSVAGINAL OB US  TECHNIQUE: Both transabdominal and transvaginal ultrasound examinations were performed for complete evaluation of the gestation as well as the maternal uterus, adnexal regions, and pelvic cul-de-sac. Transvaginal technique was performed to assess early pregnancy. COMPARISON: None. FINDINGS: Intrauterine gestational sac: No intrauterine gestational sac is visualized. Yolk sac: Not identified. Embryo: Not identified. Cardiac Activity: Not identified. Maternal uterus/adnexae: The uterus is anteverted. There is a posterior hypoechoic myometrial mass lesion measured about 2 cm maximal diameter. This is consistent with a fibroid. The endometrium is thickened at 2.4 cm with heterogeneous fluid in the endometrium. Changes are nonspecific. Decidual reaction, endometrial blood products, or retained products of conception could potentially have this appearance. The left ovary is not visualized. Limited visualization of the right ovary. There is a somewhat heterogeneous cyst in the right ovary measuring about 2.1 cm maximal diameter. Tubular cystic structure in the right adnexa may represent hydrosalpinx or adjacent cyst. Small amount of free fluid in the pelvis. IMPRESSION: No intrauterine pregnancy is identified. Heterogeneous endometrial thickening with fluid. Nonspecific cystic structures in the right adnexal regions. No intrauterine gestational sac, yolk sac, or fetal pole identified. Differential considerations include intrauterine pregnancy too early to be sonographically visualized, missed abortion, or ectopic pregnancy. Followup ultrasound is recommended in 10-14 days for further evaluation. Electronically Signed By: Burman Nieves M.D. On: 05/15/2015 02:02   US Ob Transvaginal  05/15/2015 CLINICAL DATA: 40 year old female with positive HCG levels presenting with vaginal bleeding and lower abdominal cramping. EXAM: OBSTETRIC <14 WK [place] AND TRANSVAGINAL OB US TECHNIQUE: Both transabdominal and transvaginal  ultrasound examinations were performed for complete evaluation of the gestation as well as the maternal uterus, adnexal regions, and pelvic cul-de-sac. Transvaginal technique was performed to assess early pregnancy. COMPARISON: Earlier ultrasound dated 05/15/2015 FINDINGS: The uterus is anteverted. No intrauterine pregnancy identified. There is a 5.3 x 5.2 x 4.1 cm heterogeneous mass in the region of the right adnexa anterior to the right ovary. There appears to be a gestational sac with a fetal pole within this mass. There is positive fetal cardiac activity with a heart rate of 108 beats per minute. The estimated gestational age based on crown-rump length of 3 mm is 5 weeks, 6 days. Small amount of free fluid noted within the pelvis. IMPRESSION: Right adnexal ectopic pregnancy with an estimated gestational age of [redacted] weeks, 6 days and positive cardiac activity of 108 beats per minute. Obstetrical consult is advised. Electronically Signed By: Elgie Collard M.D. On: 05/15/2015 06:17   US Ob Transvaginal  05/15/2015 CLINICAL DATA: Vaginal bleeding with lower abdominal pain and cramping for 5 days. Estimated gestational age by LMP is 5 weeks 0 days. Quantitative beta HCG is 15,397. EXAM: OBSTETRIC <14 WK [place] AND TRANSVAGINAL OB US TECHNIQUE: Both transabdominal and transvaginal ultrasound examinations were performed for complete evaluation of the gestation as well as the maternal uterus, adnexal regions, and pelvic cul-de-sac. Transvaginal technique was  performed to assess early pregnancy. COMPARISON: None. FINDINGS: Intrauterine gestational sac: No intrauterine gestational sac is visualized. Yolk sac: Not identified. Embryo: Not identified. Cardiac Activity: Not identified. Maternal uterus/adnexae: The uterus is anteverted. There is a posterior hypoechoic myometrial mass lesion measured about 2 cm maximal diameter. This is consistent with a fibroid. The endometrium is thickened at 2.4 cm with  heterogeneous fluid in the endometrium. Changes are nonspecific. Decidual reaction, endometrial blood products, or retained products of conception could potentially have this appearance. The left ovary is not visualized. Limited visualization of the right ovary. There is a somewhat heterogeneous cyst in the right ovary measuring about 2.1 cm maximal diameter. Tubular cystic structure in the right adnexa may represent hydrosalpinx or adjacent cyst. Small amount of free fluid in the pelvis. IMPRESSION: No intrauterine pregnancy is identified. Heterogeneous endometrial thickening with fluid. Nonspecific cystic structures in the right adnexal regions. No intrauterine gestational sac, yolk sac, or fetal pole identified. Differential considerations include intrauterine pregnancy too early to be sonographically visualized, missed abortion, or ectopic pregnancy. Followup ultrasound is recommended in 10-14 days for further evaluation. Electronically Signed By: Burman Nieves M.D. On: 05/15/2015 02:02    Review of Systems  Constitutional: Negative for fever.  Gastrointestinal: Positive for abdominal pain. Negative for nausea and vomiting.  Musculoskeletal: Negative for joint pain (Denies shoulder pain. ).   Physical Exam   Blood pressure 135/96, pulse 99, temperature 98 F (36.7 C), resp. rate 20, last menstrual period 04/10/2015.  Physical Exam  Constitutional: She is oriented to person, place, and time. She appears well-developed and well-nourished. No distress.  HENT:  Head: Normocephalic.  Eyes: Pupils are equal, round, and reactive to light.  Respiratory: Effort normal.  GI: There is tenderness in the right lower quadrant and suprapubic area. There is rigidity and guarding. There is no rebound.  Musculoskeletal: Normal range of motion.  Neurological: She is alert and oriented to person, place, and time.  Skin: Skin is warm. She is not diaphoretic.  Psychiatric: Her behavior is normal.     US Ob Comp Less 14 Wks  05/15/2015  CLINICAL DATA:  40 year old female with positive HCG levels presenting with vaginal bleeding and lower abdominal cramping. EXAM: OBSTETRIC <14 WK [place] AND TRANSVAGINAL OB US TECHNIQUE: Both transabdominal and transvaginal ultrasound examinations were performed for complete evaluation of the gestation as well as the maternal uterus, adnexal regions, and pelvic cul-de-sac. Transvaginal technique was performed to assess early pregnancy. COMPARISON:  Earlier ultrasound dated 05/15/2015 FINDINGS: The uterus is anteverted.  No intrauterine pregnancy identified. There is a 5.3 x 5.2 x 4.1 cm heterogeneous mass in the region of the right adnexa anterior to the right ovary. There appears to be a gestational sac with a fetal pole within this mass. There is positive fetal cardiac activity with a heart rate of 108 beats per minute. The estimated gestational age based on crown-rump length of 3 mm is 5 weeks, 6 days. Small amount of free fluid noted within the pelvis. IMPRESSION: Right adnexal ectopic pregnancy with an estimated gestational age of [redacted] weeks, 6 days and positive cardiac activity of 108 beats per minute. Obstetrical consult is advised. Electronically Signed   By: Elgie Collard M.D.   On: 05/15/2015 06:17   US Ob Comp Less 14 Wks  05/15/2015  CLINICAL DATA:  Vaginal bleeding with lower abdominal pain and cramping for 5 days. Estimated gestational age by LMP is 5 weeks 0 days. Quantitative beta HCG is 15,397. EXAM: OBSTETRIC <14 WK  US AND TRANSVAGINAL OB US TECHNIQUE: Both transabdominal and transvaginal ultrasound examinations were performed for complete evaluation of the gestation as well as the maternal uterus, adnexal regions, and pelvic cul-de-sac. Transvaginal technique was performed to assess early pregnancy. COMPARISON:  None. FINDINGS: Intrauterine gestational sac: No intrauterine gestational sac is visualized. Yolk sac:  Not identified. Embryo:  Not identified.  Cardiac Activity: Not identified. Maternal uterus/adnexae: The uterus is anteverted. There is a posterior hypoechoic myometrial mass lesion measured about 2 cm maximal diameter. This is consistent with a fibroid. The endometrium is thickened at 2.4 cm with heterogeneous fluid in the endometrium. Changes are nonspecific. Decidual reaction, endometrial blood products, or retained products of conception could potentially have this appearance. The left ovary is not visualized. Limited visualization of the right ovary. There is a somewhat heterogeneous cyst in the right ovary measuring about 2.1 cm maximal diameter. Tubular cystic structure in the right adnexa may represent hydrosalpinx or adjacent cyst. Small amount of free fluid in the pelvis. IMPRESSION: No intrauterine pregnancy is identified. Heterogeneous endometrial thickening with fluid. Nonspecific cystic structures in the right adnexal regions. No intrauterine gestational sac, yolk sac, or fetal pole identified. Differential considerations include intrauterine pregnancy too early to be sonographically visualized, missed abortion, or ectopic pregnancy. Followup ultrasound is recommended in 10-14 days for further evaluation. Electronically Signed   By: Burman NievesWilliam  Stevens M.D.   On: 05/15/2015 02:02   [place] Ob Transvaginal  05/15/2015  CLINICAL DATA:  40 year old female with positive HCG levels presenting with vaginal bleeding and lower abdominal cramping. EXAM: OBSTETRIC <14 WK US AND TRANSVAGINAL OB US TECHNIQUE: Both transabdominal and transvaginal ultrasound examinations were performed for complete evaluation of the gestation as well as the maternal uterus, adnexal regions, and pelvic cul-de-sac. Transvaginal technique was performed to assess early pregnancy. COMPARISON:  Earlier ultrasound dated 05/15/2015 FINDINGS: The uterus is anteverted.  No intrauterine pregnancy identified. There is a 5.3 x 5.2 x 4.1 cm heterogeneous mass in the region of the right adnexa  anterior to the right ovary. There appears to be a gestational sac with a fetal pole within this mass. There is positive fetal cardiac activity with a heart rate of 108 beats per minute. The estimated gestational age based on crown-rump length of 3 mm is 5 weeks, 6 days. Small amount of free fluid noted within the pelvis. IMPRESSION: Right adnexal ectopic pregnancy with an estimated gestational age of [redacted] weeks, 6 days and positive cardiac activity of 108 beats per minute. Obstetrical consult is advised. Electronically Signed   By: Elgie CollardArash  Radparvar M.D.   On: 05/15/2015 06:17   [place] Ob Transvaginal  05/15/2015  CLINICAL DATA:  Vaginal bleeding with lower abdominal pain and cramping for 5 days. Estimated gestational age by LMP is 5 weeks 0 days. Quantitative beta HCG is 15,397. EXAM: OBSTETRIC <14 WK US AND TRANSVAGINAL OB US TECHNIQUE: Both transabdominal and transvaginal ultrasound examinations were performed for complete evaluation of the gestation as well as the maternal uterus, adnexal regions, and pelvic cul-de-sac. Transvaginal technique was performed to assess early pregnancy. COMPARISON:  None. FINDINGS: Intrauterine gestational sac: No intrauterine gestational sac is visualized. Yolk sac:  Not identified. Embryo:  Not identified. Cardiac Activity: Not identified. Maternal uterus/adnexae: The uterus is anteverted. There is a posterior hypoechoic myometrial mass lesion measured about 2 cm maximal diameter. This is consistent with a fibroid. The endometrium is thickened at 2.4 cm with heterogeneous fluid in the endometrium. Changes are nonspecific. Decidual reaction, endometrial blood products, or retained  products of conception could potentially have this appearance. The left ovary is not visualized. Limited visualization of the right ovary. There is a somewhat heterogeneous cyst in the right ovary measuring about 2.1 cm maximal diameter. Tubular cystic structure in the right adnexa may represent hydrosalpinx  or adjacent cyst. Small amount of free fluid in the pelvis. IMPRESSION: No intrauterine pregnancy is identified. Heterogeneous endometrial thickening with fluid. Nonspecific cystic structures in the right adnexal regions. No intrauterine gestational sac, yolk sac, or fetal pole identified. Differential considerations include intrauterine pregnancy too early to be sonographically visualized, missed abortion, or ectopic pregnancy. Followup ultrasound is recommended in 10-14 days for further evaluation. Electronically Signed   By: Burman Nieves M.D.   On: 05/15/2015 02:02    MAU Course  Procedures  None  MDM  Stat bedside US Dr. Adrian Blackwater at bedside CBC Maintain IV site Last PO intake was 8:30 pm 3/24  Assessment and Plan   A:  Right adnexal ectopic pregnancy with cardiac activity  P:  Patient to the OR for exploratory laparoscopy with unilateral salpingectomy with Dr Shawnie Pons.  OR Team called in, Dr Shawnie Pons notified.    Levie Heritage, DO 05/15/2015 6:28 AM

## 2015-05-15 NOTE — Progress Notes (Signed)
Dr Arby BarretteHAtchett notified of pt's admission and status. Aware of last food and drink at 2030 Friday

## 2015-05-15 NOTE — Discharge Instructions (Signed)
Threatened Miscarriage The ultrasound performed did not reveal any evidence of a pregnancy in your uterus. This may be because your pregnancy is very early or due to an ectopic (tubal) pregnancy. Please return to the emergency department immediately if you have pain or worsening bleeding. Please follow-up with Dr. Valentino Saxon [facility] for repeat ultrasound in 10 days A threatened miscarriage occurs when you have vaginal bleeding during your first 20 weeks of pregnancy but the pregnancy has not ended. If you have vaginal bleeding during this time, your health care provider will do tests to make sure you are still pregnant. If the tests show you are still pregnant and the developing baby (fetus) inside your womb (uterus) is still growing, your condition is considered a threatened miscarriage. A threatened miscarriage does not mean your pregnancy will end, but it does increase the risk of losing your pregnancy (complete miscarriage). CAUSES  The cause of a threatened miscarriage is usually not known. If you go on to have a complete miscarriage, the most common cause is an abnormal number of chromosomes in the developing baby. Chromosomes are the structures inside cells that hold all your genetic material. Some causes of vaginal bleeding that do not result in miscarriage include:  Having sex.  Having an infection.  Normal hormone changes of pregnancy.  Bleeding that occurs when an egg implants in your uterus. RISK FACTORS Risk factors for bleeding in early pregnancy include:  Obesity.  Smoking.  Drinking excessive amounts of alcohol or caffeine.  Recreational drug use. SIGNS AND SYMPTOMS  Light vaginal bleeding.  Mild abdominal pain or cramps. DIAGNOSIS  If you have bleeding with or without abdominal pain before 20 weeks of pregnancy, your health care provider will do tests to check whether you are still pregnant. One important test involves using sound waves and a computer (ultrasound) to create  images of the inside of your uterus. Other tests include an internal exam of your vagina and uterus (pelvic exam) and measurement of your baby's heart rate.  You may be diagnosed with a threatened miscarriage if:  Ultrasound testing shows you are still pregnant.  Your baby's heart rate is strong.  A pelvic exam shows that the opening between your uterus and your vagina (cervix) is closed.  Your heart rate and blood pressure are stable.  Blood tests confirm you are still pregnant. TREATMENT  No treatments have been shown to prevent a threatened miscarriage from going on to a complete miscarriage. However, the right home care is important.  HOME CARE INSTRUCTIONS   Make sure you keep all your appointments for prenatal care. This is very important.  Get plenty of rest.  Do not have sex or use tampons if you have vaginal bleeding.  Do not douche.  Do not smoke or use recreational drugs.  Do not drink alcohol.  Avoid caffeine. SEEK MEDICAL CARE IF:  You have light vaginal bleeding or spotting while pregnant.  You have abdominal pain or cramping.  You have a fever. SEEK IMMEDIATE MEDICAL CARE IF:  You have heavy vaginal bleeding.  You have blood clots coming from your vagina.  You have severe low back pain or abdominal cramps.  You have fever, chills, and severe abdominal pain. MAKE SURE YOU:  Understand these instructions.  Will watch your condition.  Will get help right away if you are not doing well or get worse.   This information is not intended to replace advice given to you by your health care provider. Make sure you  discuss any questions you have with your health care provider.   Document Released: 02/06/2005 Document Revised: 02/11/2013 Document Reviewed: 12/03/2012 Else[employer] Interactive Patient Education [employer]2016 [employer]

## 2015-05-15 NOTE — MAU Provider Note (Signed)
History     CSN: [id number]  Arrival date and time: 05/15/15 4098   First Provider Initiated Contact with Patient 05/15/15 [phone]      Chief Complaint  Patient presents with  . Abdominal Pain   HPI   Robyn Barker is a 40 y.o. female [phone] @ [redacted]w[redacted]d presenting to MAU with severe abdominal pain. The patient left [facility] shortly to her arrival here to MAU. She was told that her beta hormone level is high (>15,000) and they were unable to find and IUP. She was instructed to come to MAU if her pain worsened. The patient says shortly after leaving [facility] her pain in the right-mid lower quadrant became severe; she currently rates her pain 10/10. The pain is constant and sharp at times. She rates her pain 10/10.  OB History    Gravida Para Term Preterm AB TAB SAB Ectopic Multiple Living   Past Medical History  Diagnosis Date  . High cholesterol     Past Surgical History  Procedure Laterality Date  . Ovarian cyst removed      History reviewed. No pertinent family history.  Social History  Substance Use Topics  . Smoking status: Current Every Day Smoker -- 1.00 packs/day for 15 years    Types: Cigarettes  . Smokeless tobacco: Never Used  . Alcohol Use: Yes     Comment: occasionally    Allergies: No Known Allergies  Prescriptions prior to admission  Medication Sig Dispense Refill Last Dose  . acetaminophen (TYLENOL) 500 MG tablet Take 1,000 mg by mouth every 6 (six) hours as needed.   05/14/2015 at 2030  . Cholecalciferol (VITAMIN D3) 5000 UNITS CAPS Take 5,000 Units by mouth daily.   Past Week at Unknown time  . simvastatin (ZOCOR) 20 MG tablet Take 20 mg by mouth daily.   Past Week at Unknown time   Results for orders placed or performed during the hospital encounter of 05/15/15 (from the past 48 hour(s))  CBC     Status: Abnormal   Collection Time: 05/15/15  4:43 AM  Result Value Ref Range   WBC 13.0 (H) 4.0 - 10.5 K/uL   RBC 4.31 3.87 - 5.11 MIL/uL    Hemoglobin 12.9 12.0 - 15.0 g/dL   HCT 21.3 08.6 - 57.8 %   MCV 87.7 78.0 - 100.0 fL   MCH 29.9 26.0 - 34.0 pg   MCHC 34.1 30.0 - 36.0 g/dL   RDW 46.9 62.9 - 52.8 %   Platelets 316 150 - 400 K/uL   US Ob Comp Less 14 Wks  05/15/2015  CLINICAL DATA:  40 year old female with positive HCG levels presenting with vaginal bleeding and lower abdominal cramping. EXAM: OBSTETRIC <14 WK [place] AND TRANSVAGINAL OB US TECHNIQUE: Both transabdominal and transvaginal ultrasound examinations were performed for complete evaluation of the gestation as well as the maternal uterus, adnexal regions, and pelvic cul-de-sac. Transvaginal technique was performed to assess early pregnancy. COMPARISON:  Earlier ultrasound dated 05/15/2015 FINDINGS: The uterus is anteverted.  No intrauterine pregnancy identified. There is a 5.3 x 5.2 x 4.1 cm heterogeneous mass in the region of the right adnexa anterior to the right ovary. There appears to be a gestational sac with a fetal pole within this mass. There is positive fetal cardiac activity with a heart rate of 108 beats per minute. The estimated gestational age based on crown-rump length of 3 mm is 5  weeks, 6 days. Small amount of free fluid noted within the pelvis. IMPRESSION: Right adnexal ectopic pregnancy with an estimated gestational age of [redacted] weeks, 6 days and positive cardiac activity of 108 beats per minute. Obstetrical consult is advised. Electronically Signed   By: Elgie Collard M.D.   On: 05/15/2015 06:17   US Ob Comp Less 14 Wks  05/15/2015  CLINICAL DATA:  Vaginal bleeding with lower abdominal pain and cramping for 5 days. Estimated gestational age by LMP is 5 weeks 0 days. Quantitative beta HCG is 15,397. EXAM: OBSTETRIC <14 WK [place] AND TRANSVAGINAL OB US TECHNIQUE: Both transabdominal and transvaginal ultrasound examinations were performed for complete evaluation of the gestation as well as the maternal uterus, adnexal regions, and pelvic cul-de-sac. Transvaginal  technique was performed to assess early pregnancy. COMPARISON:  None. FINDINGS: Intrauterine gestational sac: No intrauterine gestational sac is visualized. Yolk sac:  Not identified. Embryo:  Not identified. Cardiac Activity: Not identified. Maternal uterus/adnexae: The uterus is anteverted. There is a posterior hypoechoic myometrial mass lesion measured about 2 cm maximal diameter. This is consistent with a fibroid. The endometrium is thickened at 2.4 cm with heterogeneous fluid in the endometrium. Changes are nonspecific. Decidual reaction, endometrial blood products, or retained products of conception could potentially have this appearance. The left ovary is not visualized. Limited visualization of the right ovary. There is a somewhat heterogeneous cyst in the right ovary measuring about 2.1 cm maximal diameter. Tubular cystic structure in the right adnexa may represent hydrosalpinx or adjacent cyst. Small amount of free fluid in the pelvis. IMPRESSION: No intrauterine pregnancy is identified. Heterogeneous endometrial thickening with fluid. Nonspecific cystic structures in the right adnexal regions. No intrauterine gestational sac, yolk sac, or fetal pole identified. Differential considerations include intrauterine pregnancy too early to be sonographically visualized, missed abortion, or ectopic pregnancy. Followup ultrasound is recommended in 10-14 days for further evaluation. Electronically Signed   By: Burman Nieves M.D.   On: 05/15/2015 02:02   US Ob Transvaginal  05/15/2015  CLINICAL DATA:  40 year old female with positive HCG levels presenting with vaginal bleeding and lower abdominal cramping. EXAM: OBSTETRIC <14 WK [place] AND TRANSVAGINAL OB US TECHNIQUE: Both transabdominal and transvaginal ultrasound examinations were performed for complete evaluation of the gestation as well as the maternal uterus, adnexal regions, and pelvic cul-de-sac. Transvaginal technique was performed to assess early  pregnancy. COMPARISON:  Earlier ultrasound dated 05/15/2015 FINDINGS: The uterus is anteverted.  No intrauterine pregnancy identified. There is a 5.3 x 5.2 x 4.1 cm heterogeneous mass in the region of the right adnexa anterior to the right ovary. There appears to be a gestational sac with a fetal pole within this mass. There is positive fetal cardiac activity with a heart rate of 108 beats per minute. The estimated gestational age based on crown-rump length of 3 mm is 5 weeks, 6 days. Small amount of free fluid noted within the pelvis. IMPRESSION: Right adnexal ectopic pregnancy with an estimated gestational age of [redacted] weeks, 6 days and positive cardiac activity of 108 beats per minute. Obstetrical consult is advised. Electronically Signed   By: Elgie Collard M.D.   On: 05/15/2015 06:17   US Ob Transvaginal  05/15/2015  CLINICAL DATA:  Vaginal bleeding with lower abdominal pain and cramping for 5 days. Estimated gestational age by LMP is 5 weeks 0 days. Quantitative beta HCG is 15,397. EXAM: OBSTETRIC <14 WK [place] AND TRANSVAGINAL OB US TECHNIQUE: Both transabdominal and transvaginal ultrasound examinations were performed for complete  evaluation of the gestation as well as the maternal uterus, adnexal regions, and pelvic cul-de-sac. Transvaginal technique was performed to assess early pregnancy. COMPARISON:  None. FINDINGS: Intrauterine gestational sac: No intrauterine gestational sac is visualized. Yolk sac:  Not identified. Embryo:  Not identified. Cardiac Activity: Not identified. Maternal uterus/adnexae: The uterus is anteverted. There is a posterior hypoechoic myometrial mass lesion measured about 2 cm maximal diameter. This is consistent with a fibroid. The endometrium is thickened at 2.4 cm with heterogeneous fluid in the endometrium. Changes are nonspecific. Decidual reaction, endometrial blood products, or retained products of conception could potentially have this appearance. The left ovary is not  visualized. Limited visualization of the right ovary. There is a somewhat heterogeneous cyst in the right ovary measuring about 2.1 cm maximal diameter. Tubular cystic structure in the right adnexa may represent hydrosalpinx or adjacent cyst. Small amount of free fluid in the pelvis. IMPRESSION: No intrauterine pregnancy is identified. Heterogeneous endometrial thickening with fluid. Nonspecific cystic structures in the right adnexal regions. No intrauterine gestational sac, yolk sac, or fetal pole identified. Differential considerations include intrauterine pregnancy too early to be sonographically visualized, missed abortion, or ectopic pregnancy. Followup ultrasound is recommended in 10-14 days for further evaluation. Electronically Signed   By: Burman NievesWilliam  Stevens M.D.   On: 05/15/2015 02:02   Review of Systems  Constitutional: Negative for fever.  Gastrointestinal: Positive for abdominal pain. Negative for nausea and vomiting.  Musculoskeletal: Negative for joint pain (Denies shoulder pain. ).   Physical Exam   Blood pressure 135/96, pulse 99, temperature 98 F (36.7 C), resp. rate 20, last menstrual period 04/10/2015.  Physical Exam  Constitutional: She is oriented to person, place, and time. She appears well-developed and well-nourished. No distress.  HENT:  Head: Normocephalic.  Eyes: Pupils are equal, round, and reactive to light.  Respiratory: Effort normal.  GI: There is tenderness in the right lower quadrant and suprapubic area. There is rigidity and guarding. There is no rebound.  Musculoskeletal: Normal range of motion.  Neurological: She is alert and oriented to person, place, and time.  Skin: Skin is warm. She is not diaphoretic.  Psychiatric: Her behavior is normal.    MAU Course  Procedures  None  MDM  Stat bedside US Dr. Adrian BlackwaterStinson at bedside Type and screen CBC Maintain IV site Last PO intake was 8:30 pm 3/24  Assessment and Plan   A:  Right adnexal ectopic  pregnancy with cardiac activity  P:  Patient to the OR.   Duane LopeJennifer I Rasch, NP 05/15/2015 6:23 AM

## 2015-05-15 NOTE — Progress Notes (Signed)
To OR via stretcher

## 2015-05-17 ENCOUNTER — Telehealth: Payer: Self-pay

## 2015-05-17 ENCOUNTER — Encounter (HOSPITAL_COMMUNITY): Payer: Self-pay | Admitting: Family Medicine

## 2015-05-17 NOTE — Telephone Encounter (Signed)
Called patient, someone other than patient answered the phone, left a message with them to have the patient return my call here at the office.

## 2015-05-21 ENCOUNTER — Other Ambulatory Visit: Payer: Self-pay | Admitting: Family Medicine

## 2015-05-21 MED ORDER — OXYCODONE-ACETAMINOPHEN 5-325 MG PO TABS: 1.0000 | ORAL_TABLET | Freq: Four times a day (QID) | ORAL | Status: DC | PRN

## 2015-05-31 ENCOUNTER — Ambulatory Visit (INDEPENDENT_AMBULATORY_CARE_PROVIDER_SITE_OTHER): Payer: Medicaid Other | Admitting: Family Medicine

## 2015-05-31 ENCOUNTER — Encounter: Payer: Self-pay | Admitting: *Deleted

## 2015-05-31 ENCOUNTER — Encounter: Payer: Medicaid Other | Admitting: Family Medicine

## 2015-05-31 VITALS — BP 123/73 | HR 94 | Resp 16 | Ht 61.5 in | Wt 180.0 lb

## 2015-05-31 DIAGNOSIS — Z3009 Encounter for other general counseling and advice on contraception: Secondary | ICD-10-CM

## 2015-05-31 DIAGNOSIS — Z09 Encounter for follow-up examination after completed treatment for conditions other than malignant neoplasm: Secondary | ICD-10-CM

## 2015-05-31 NOTE — Progress Notes (Signed)
    Subjective:    Patient ID: Earnest ConroyFarrah M Kiang is a 40 y.o. female presenting with Follow-up  on 05/31/2015  HPI: Patient here for f/u following removal of ectopic due to ruptured ectopic pregnancy.  Review of Systems  Constitutional: Negative for fever and chills.  Respiratory: Negative for shortness of breath.   Cardiovascular: Negative for chest pain.  Gastrointestinal: Negative for nausea, vomiting and abdominal pain.  Genitourinary: Negative for dysuria.  Skin: Negative for rash.      Objective:    BP 123/73 mmHg  Pulse 94  Resp 16  Ht 5' 1.5" (1.562 m)  Wt 180 lb (81.647 kg)  BMI 33.46 kg/m2  LMP 04/10/2015 Physical Exam  Constitutional: She is oriented to person, place, and time. She appears well-developed and well-nourished. No distress.  HENT:  Head: Normocephalic and atraumatic.  Eyes: No scleral icterus.  Neck: Neck supple.  Cardiovascular: Normal rate.   Pulmonary/Chest: Effort normal.  Abdominal: Soft.  Neurological: She is alert and oriented to person, place, and time.  Skin: Skin is warm and dry.  Incisions are healing well.  Psychiatric: She has a normal mood and affect.      Assessment & Plan:   Problem List Items Addressed This Visit    None    Visit Diagnoses    Postop check    -  Primary    Healing well.    Encounter for other general counseling or advice on contraception        desires Depo-has had unprotected intercourse--return with menses and negative UPT for Depo      Return in about 4 weeks (around 06/28/2015) for Depo.   PRATT,TANYA S 05/31/2015 4:27 PM

## 2015-05-31 NOTE — Patient Instructions (Signed)

## 2015-09-01 ENCOUNTER — Emergency Department
Admission: EM | Admit: 2015-09-01 | Discharge: 2015-09-01 | Disposition: A | Discharge status: Home / Self Care | Payer: Medicaid Other | Attending: Emergency Medicine | Admitting: Emergency Medicine

## 2015-09-01 DIAGNOSIS — Z791 Long term (current) use of non-steroidal anti-inflammatories (NSAID): Secondary | ICD-10-CM | POA: Insufficient documentation

## 2015-09-01 DIAGNOSIS — L02214 Cutaneous abscess of groin: Secondary | ICD-10-CM | POA: Diagnosis not present

## 2015-09-01 DIAGNOSIS — Z79899 Other long term (current) drug therapy: Secondary | ICD-10-CM | POA: Insufficient documentation

## 2015-09-01 DIAGNOSIS — F1290 Cannabis use, unspecified, uncomplicated: Secondary | ICD-10-CM | POA: Insufficient documentation

## 2015-09-01 DIAGNOSIS — F17210 Nicotine dependence, cigarettes, uncomplicated: Secondary | ICD-10-CM | POA: Diagnosis not present

## 2015-09-01 MED ORDER — SULFAMETHOXAZOLE-TRIMETHOPRIM 800-160 MG PO TABS: 1.0000 | ORAL_TABLET | Freq: Two times a day (BID) | ORAL | Status: AC

## 2015-09-01 MED ORDER — LIDOCAINE HCL (PF) 1 % IJ SOLN
INTRAMUSCULAR | Status: AC
  Administered 2015-09-01: 5 mL
  Filled 2015-09-01: qty 5

## 2015-09-01 MED ORDER — SULFAMETHOXAZOLE-TRIMETHOPRIM 800-160 MG PO TABS
2.0000 | ORAL_TABLET | Freq: Once | ORAL | Status: AC
  Administered 2015-09-01: 2 via ORAL
  Filled 2015-09-01: qty 2

## 2015-09-01 MED ORDER — OXYCODONE-ACETAMINOPHEN 5-325 MG PO TABS: 1.0000 | ORAL_TABLET | ORAL | Status: DC | PRN

## 2015-09-01 NOTE — Discharge Instructions (Signed)

## 2015-09-01 NOTE — ED Notes (Signed)
Dr. Manson PasseyBrown at bedside draining cyst and conducting a pelvic examination.

## 2015-09-01 NOTE — ED Provider Notes (Signed)
[facility] Emergency Department Provider Note  ____________________________________________  Time seen: 6:25 AM  I have reviewed the triage vital signs and the nursing notes.   HISTORY  Chief Complaint Abscess    HPI Robyn Barker is a 40 y.o. female presents with abscess right "vagina area 2 weeks. Patient denies any fever afebrile on presentation with a temperature 98.7. Patient denies any previous history of abscess. Patient states she noted pain and swelling in the area after shaving.    Past Medical History  Diagnosis Date  . High cholesterol     There are no active problems to display for this patient.   Past Surgical History  Procedure Laterality Date  . Ovarian cyst removed    . Diagnostic laparoscopy with removal of ectopic pregnancy N/A 05/15/2015    Procedure: DIAGNOSTIC LAPAROSCOPY WITH REMOVAL OF ECTOPIC PREGNANCY;  Surgeon: Reva Bores, MD;  Location: [facility] ORS;  Service: Gynecology;  Laterality: N/A;    Current Outpatient Rx  Name  Route  Sig  Dispense  Refill  . acetaminophen (TYLENOL) 500 MG tablet   Oral   Take 1,000 mg by mouth every 6 (six) hours as needed.         . Cholecalciferol (VITAMIN D3) 5000 UNITS CAPS   Oral   Take 5,000 Units by mouth daily.         . fluticasone (FLONASE) 50 MCG/ACT nasal spray      SPRAY 2 SPRAYS INTO EACH NOSTRIL 2 TIMES DAILY      2   . ibuprofen (ADVIL,MOTRIN) 800 MG tablet   Oral   Take 800 mg by mouth 3 (three) times daily as needed.      2   . oxyCODONE-acetaminophen (PERCOCET/ROXICET) 5-325 MG tablet   Oral   Take 1-2 tablets by mouth every 6 (six) hours as needed.   20 tablet   0   . PROAIR HFA 108 (90 Base) MCG/ACT inhaler      INHALE 2 PUFFS EVERY FOUR TO SIX HOURS AS NEEDED      5     Dispense as written.   Marland Kitchen QVAR 80 MCG/ACT inhaler      TAKE 2 PUFFS 2 TIMES A DAY      5     Dispense as written.   . simvastatin (ZOCOR) 20 MG tablet   Oral   Take  20 mg by mouth daily.           Allergies No known drug allergies No family history on file.  Social History Social History  Substance Use Topics  . Smoking status: Current Every Day Smoker -- 1.00 packs/day for 15 years    Types: Cigarettes  . Smokeless tobacco: Never Used  . Alcohol Use: Yes     Comment: occasionally    Review of Systems  Constitutional: Negative for fever. Eyes: Negative for visual changes. ENT: Negative for sore throat. Cardiovascular: Negative for chest pain. Respiratory: Negative for shortness of breath. Gastrointestinal: Negative for abdominal pain, vomiting and diarrhea. Genitourinary: Negative for dysuria. Musculoskeletal: Negative for back pain. Skin:Positive for right groin/vagina area abscess Neurological: Negative for headaches, focal weakness or numbness.   10-point ROS otherwise negative.  ____________________________________________   PHYSICAL EXAM:  VITAL SIGNS: ED Triage Vitals  Enc Vitals Group     BP 09/01/15 0110 109/75 mmHg     Pulse Rate 09/01/15 0110 103     Resp 09/01/15 0110 18     Temp 09/01/15 0110 98.7  F (37.1 C)     Temp Source 09/01/15 0110 Oral     SpO2 09/01/15 0110 98 %     Weight 09/01/15 0110 183 lb (83.008 kg)     Height 09/01/15 0110 5' 1.5" (1.562 m)     Head Cir --      Peak Flow --      Pain Score 09/01/15 0115 8     Pain Loc --      Pain Edu? --      Excl. in GC? --      Constitutional: Alert and oriented. Well appearing and in no distress. Eyes: Conjunctivae are normal. PERRL. Normal extraocular movements. ENT   Head: Normocephalic and atraumatic.   Nose: No congestion/rhinnorhea.   Mouth/Throat: Mucous membranes are moist.   Neck: No stridor. Hematological/Lymphatic/Immunilogical: No cervical lymphadenopathy. Cardiovascular: Normal rate, regular rhythm. Normal and symmetric distal pulses are present in all extremities. No murmurs, rubs, or gallops. Respiratory: Normal  respiratory effort without tachypnea nor retractions. Breath sounds are clear and equal bilaterally. No wheezes/rales/rhonchi. Gastrointestinal: Soft and nontender. No distention. There is no CVA tenderness. Genitourinary: Deferred Skin:  6 x 5 cm area of flocculence and induration noted in the right groin/mons pubis Psychiatric: Mood and affect are normal. Speech and behavior are normal. Patient exhibits appropriate insight and judgment.     PROCEDURES  Procedure(s) performed: INCISION AND DRAINAGE Performed by: Darci CurrentANDOLPH N BROWN Consent: Verbal consent obtained. Risks and benefits: risks, benefits and alternatives were discussed Type: abscess  Body area: Right mons pubis  Anesthesia: local infiltration  Incision was made with a scalpel.  Local anesthetic: lidocaine 1% %  Anesthetic total: 5 ml  Complexity: complex  Drainage: purulent  Drainage amount: 10 mL    Patient tolerance: Patient tolerated the procedure well with no immediate complications.    Procedures     INITIAL IMPRESSION / ASSESSMENT AND PLAN / ED COURSE  Pertinent labs & imaging results that were available during my care of the patient were reviewed by me and considered in my medical decision making (see chart for details).  Patient given Bactrim DS and Percocet with advice to return to the emergency department worsening pain swelling fever or any other emergency medical concerns.  ____________________________________________   FINAL CLINICAL IMPRESSION(S) / ED DIAGNOSES  Final diagnoses:  Abscess of groin, right      Darci Currentandolph N Brown, MD 09/01/15 [phone]

## 2015-09-01 NOTE — ED Notes (Signed)
Pt in with co abscess to vaginal area for 2 weeks.

## 2015-10-15 ENCOUNTER — Other Ambulatory Visit: Payer: Self-pay | Admitting: Family Medicine

## 2015-10-15 DIAGNOSIS — Z1231 Encounter for screening mammogram for malignant neoplasm of breast: Secondary | ICD-10-CM

## 2015-12-06 ENCOUNTER — Ambulatory Visit: Payer: Medicaid Other

## 2015-12-10 ENCOUNTER — Encounter (INDEPENDENT_AMBULATORY_CARE_PROVIDER_SITE_OTHER): Payer: Medicaid Other | Admitting: *Deleted

## 2015-12-10 DIAGNOSIS — O00109 Unspecified tubal pregnancy without intrauterine pregnancy: Secondary | ICD-10-CM

## 2015-12-29 ENCOUNTER — Ambulatory Visit
Admission: RE | Admit: 2015-12-29 | Discharge: 2015-12-29 | Disposition: A | Discharge status: Home / Self Care | Payer: Medicaid Other | Source: Ambulatory Visit | Attending: Family Medicine | Admitting: Family Medicine

## 2015-12-29 DIAGNOSIS — Z1231 Encounter for screening mammogram for malignant neoplasm of breast: Secondary | ICD-10-CM

## 2016-02-13 ENCOUNTER — Encounter: Payer: Self-pay | Admitting: Emergency Medicine

## 2016-02-13 ENCOUNTER — Emergency Department
Admission: EM | Admit: 2016-02-13 | Discharge: 2016-02-13 | Disposition: A | Discharge status: Home / Self Care | Payer: Medicaid Other | Attending: Emergency Medicine | Admitting: Emergency Medicine

## 2016-02-13 DIAGNOSIS — R070 Pain in throat: Secondary | ICD-10-CM | POA: Diagnosis present

## 2016-02-13 DIAGNOSIS — R51 Headache: Secondary | ICD-10-CM

## 2016-02-13 DIAGNOSIS — F1290 Cannabis use, unspecified, uncomplicated: Secondary | ICD-10-CM | POA: Insufficient documentation

## 2016-02-13 DIAGNOSIS — Z79899 Other long term (current) drug therapy: Secondary | ICD-10-CM | POA: Insufficient documentation

## 2016-02-13 DIAGNOSIS — R110 Nausea: Secondary | ICD-10-CM | POA: Insufficient documentation

## 2016-02-13 DIAGNOSIS — J029 Acute pharyngitis, unspecified: Secondary | ICD-10-CM

## 2016-02-13 DIAGNOSIS — R519 Headache, unspecified: Secondary | ICD-10-CM

## 2016-02-13 DIAGNOSIS — F17210 Nicotine dependence, cigarettes, uncomplicated: Secondary | ICD-10-CM | POA: Insufficient documentation

## 2016-02-13 LAB — POCT RAPID STREP A: Streptococcus, Group A Screen (Direct): NEGATIVE

## 2016-02-13 MED ORDER — BUTALBITAL-APAP-CAFFEINE 50-325-40 MG PO TABS: 1.0000 | ORAL_TABLET | Freq: Four times a day (QID) | ORAL | 0 refills | Status: DC | PRN

## 2016-02-13 MED ORDER — KETOROLAC TROMETHAMINE 60 MG/2ML IM SOLN
60.0000 mg | Freq: Once | INTRAMUSCULAR | Status: AC
  Administered 2016-02-13: 60 mg via INTRAMUSCULAR
  Filled 2016-02-13: qty 2

## 2016-02-13 MED ORDER — DEXAMETHASONE 10 MG/ML FOR PEDIATRIC ORAL USE
10.0000 mg | Freq: Once | INTRAMUSCULAR | Status: AC
  Administered 2016-02-13: 10 mg via ORAL

## 2016-02-13 MED ORDER — TRAMADOL HCL 50 MG PO TABS: 50.0000 mg | ORAL_TABLET | Freq: Four times a day (QID) | ORAL | 0 refills | Status: DC | PRN

## 2016-02-13 MED ORDER — BUTALBITAL-APAP-CAFFEINE 50-325-40 MG PO TABS
2.0000 | ORAL_TABLET | Freq: Once | ORAL | Status: AC
  Administered 2016-02-13: 2 via ORAL
  Filled 2016-02-13: qty 2

## 2016-02-13 MED ORDER — DEXAMETHASONE SODIUM PHOSPHATE 10 MG/ML IJ SOLN
INTRAMUSCULAR | Status: AC
  Administered 2016-02-13: 10 mg via ORAL
  Filled 2016-02-13: qty 1

## 2016-02-13 NOTE — ED Provider Notes (Addendum)
[facility] Emergency Department Provider Note   ____________________________________________   First MD Initiated Contact with Patient 02/13/16 [phone]     (approximate)  I have reviewed the triage vital signs and the nursing notes.   HISTORY  Chief Complaint Sore Throat    HPI Robyn Barker is a 40 y.o. female who comes into the hospital today with throat pain. She is having the symptoms right after she got off work. She states that she got off work around 7 PM. The patient went to sleep and then woke up with the pain really bad. She reports that she has been feeling nauseous with a headache earlier today. She had taken some Tylenol for the headache but it did not help. The patient reports it hurts to swallow. She denies any fever, runny nose, cough. The patient rates her pain 8 out of 10 in intensity currently. She denies any chest pain or shortness of breath. The patient reports that when she had the symptoms in the past she had strep throat. The patient was concerned so she decided to come into the hospital.   Past Medical History:  Diagnosis Date  . High cholesterol     There are no active problems to display for this patient.   Past Surgical History:  Procedure Laterality Date  . DIAGNOSTIC LAPAROSCOPY WITH REMOVAL OF ECTOPIC PREGNANCY N/A 05/15/2015   Procedure: DIAGNOSTIC LAPAROSCOPY WITH REMOVAL OF ECTOPIC PREGNANCY;  Surgeon: Reva Bores, MD;  Location: [facility];  Service: Gynecology;  Laterality: N/A;  . ovarian cyst removed      Prior to Admission medications   Medication Sig Start Date End Date Taking? Authorizing Provider  acetaminophen (TYLENOL) 500 MG tablet Take 1,000 mg by mouth every 6 (six) hours as needed.    Historical Provider, MD  butalbital-acetaminophen-caffeine (FIORICET, ESGIC) 50-325-40 MG tablet Take 1-2 tablets by mouth every 6 (six) hours as needed for headache. 02/13/16 02/12/17  Rebecka Apley, MD    Cholecalciferol (VITAMIN D3) 5000 UNITS CAPS Take 5,000 Units by mouth daily.    Historical Provider, MD  fluticasone (FLONASE) 50 MCG/ACT nasal spray SPRAY 2 SPRAYS INTO EACH NOSTRIL 2 TIMES DAILY 05/06/15   Historical Provider, MD  ibuprofen (ADVIL,MOTRIN) 800 MG tablet Take 800 mg by mouth 3 (three) times daily as needed. 05/06/15   Historical Provider, MD  oxyCODONE-acetaminophen (PERCOCET/ROXICET) 5-325 MG tablet Take 1-2 tablets by mouth every 6 (six) hours as needed. 05/21/15   Reva Bores, MD  oxyCODONE-acetaminophen (ROXICET) 5-325 MG tablet Take 1 tablet by mouth every 4 (four) hours as needed for severe pain. 09/01/15   Darci Current, MD  PROAIR HFA 108 [phone] Base) MCG/ACT inhaler INHALE 2 PUFFS EVERY FOUR TO SIX HOURS AS NEEDED 05/10/15   Historical Provider, MD  QVAR 80 MCG/ACT inhaler TAKE 2 PUFFS 2 TIMES A DAY 05/10/15   Historical Provider, MD  simvastatin (ZOCOR) 20 MG tablet Take 20 mg by mouth daily.    Historical Provider, MD  traMADol (ULTRAM) 50 MG tablet Take 1 tablet (50 mg total) by mouth every 6 (six) hours as needed. 02/13/16   Rebecka Apley, MD    Allergies Patient has no known allergies.  Family History  Problem Relation Age of Onset  . Breast cancer Maternal Aunt     Social History Social History  Substance Use Topics  . Smoking status: Current Every Day Smoker    Packs/day: 1.00    Years: 15.00  Types: Cigarettes  . Smokeless tobacco: Never Used  . Alcohol use Yes     Comment: occasionally    Review of Systems Constitutional: No fever/chills Eyes: No visual changes. ENT:  sore throat. Cardiovascular: Denies chest pain. Respiratory: Denies shortness of breath. Gastrointestinal: Nausea with No abdominal pain, no vomiting.  No diarrhea.  No constipation. Genitourinary: Negative for dysuria. Musculoskeletal: Negative for back pain. Skin: Negative for rash. Neurological: Headache  10-point ROS otherwise  negative.  ____________________________________________   PHYSICAL EXAM:  VITAL SIGNS: ED Triage Vitals [02/13/16 0322]  Enc Vitals Group     BP 124/79     Pulse Rate 92     Resp 16     Temp 98.1 F (36.7 C)     Temp Source Oral     SpO2 99 %     Weight 180 lb (81.6 kg)     Height 5\' 1"  (1.549 m)     Head Circumference      Peak Flow      Pain Score 7     Pain Loc      Pain Edu?      Excl. in GC?     Constitutional: Alert and oriented. Well appearing and in Moderate distress. Eyes: Conjunctivae are normal. PERRL. EOMI. Head: Atraumatic. Nose: No congestion/rhinnorhea. Mouth/Throat: Mucous membranes are moist.  Oropharynx with some mild erythema and no exudates. Neck: Some mild cervical lymphadenopathy Cardiovascular: Normal rate, regular rhythm. Grossly normal heart sounds.  Good peripheral circulation. Respiratory: Normal respiratory effort.  No retractions. Lungs CTAB. Gastrointestinal: Soft and nontender. No distention. Positive bowel sounds Musculoskeletal: No lower extremity tenderness nor edema.   Neurologic:  Normal speech and language.  Skin:  Skin is warm, dry and intact. Marland Kitchen. Psychiatric: Mood and affect are normal.   ____________________________________________   LABS (all labs ordered are listed, but only abnormal results are displayed)  Labs Reviewed  CULTURE, GROUP A STREP [facility]  POCT RAPID STREP A   ____________________________________________  EKG  none ____________________________________________  RADIOLOGY  none ____________________________________________   PROCEDURES  Procedure(s) performed: None  Procedures  Critical Care performed: No  ____________________________________________   INITIAL IMPRESSION / ASSESSMENT AND PLAN / ED COURSE  Pertinent labs & imaging results that were available during my care of the patient were reviewed by me and considered in my medical decision making (see chart for details).  This is a  40 year old female who comes into the hospital today with some sore throat. The patient is concerned she may have strep throat. We did give the patient a strep test and it was negative. I'll give the patient some Decadron, Toradol and Fioricet for her headache. I will reassess the patient and then disposition the patient. I informed her that should the culture return positive we'll call her in a prescription.  Clinical Course    The patient feels improved she'll be discharged home.  ____________________________________________   FINAL CLINICAL IMPRESSION(S) / ED DIAGNOSES  Final diagnoses:  Pharyngitis, unspecified etiology  Nonintractable headache, unspecified chronicity pattern, unspecified headache type      NEW MEDICATIONS STARTED DURING THIS VISIT:  New Prescriptions   BUTALBITAL-ACETAMINOPHEN-CAFFEINE (FIORICET, ESGIC) 50-325-40 MG TABLET    Take 1-2 tablets by mouth every 6 (six) hours as needed for headache.   TRAMADOL (ULTRAM) 50 MG TABLET    Take 1 tablet (50 mg total) by mouth every 6 (six) hours as needed.     Note:  This document was prepared using Conservation officer, historic buildingsDragon voice recognition software and  may include unintentional dictation errors.    Rebecka ApleyAllison P Leanor Voris, MD 02/13/16 [id number]    Rebecka ApleyAllison P Alina Gilkey, MD 02/13/16 [id number]

## 2016-02-13 NOTE — ED Triage Notes (Signed)
Pt ambulatory to triage in NAD, reports sore throat since yesterday, reports low grade fever, pt afebrile in triage

## 2016-02-14 LAB — CULTURE, GROUP A STREP (THRC)

## 2016-02-15 NOTE — Progress Notes (Signed)
ED Antimicrobial Stewardship Positive Culture Follow Up   Robyn ConroyFarrah M Barker is an 40 y.o. female who [facility] with a chief complaint of  Chief Complaint  Patient presents with  . Sore Throat    Recent Results (from the past 720 hour(s))  Culture, group A strep     Status: None   Collection Time: 02/13/16  3:26 AM  Result Value Ref Range Status   Specimen Description THROAT  Final   Special Requests NONE  Final   Culture FEW GROUP A STREP (S.PYOGENES) ISOLATED  Final   Report Status 02/14/2016 FINAL  Final    [x]  Patient discharged originally without antimicrobial agent and treatment is now indicated  New antibiotic prescription: amoxicillin 500 mg PO BID x 10 days  ED Provider: Dr. Harvie BridgeKinner  Spoke with patient - explained results and called in above prescription to [facility] [place], [place].  Cindi CarbonMary M Zoya Sprecher, PharmD Clinical Pharmacist 02/15/2016, 2:24 PM

## 2016-05-09 ENCOUNTER — Encounter: Payer: Self-pay | Admitting: Emergency Medicine

## 2016-05-09 ENCOUNTER — Emergency Department
Admission: EM | Admit: 2016-05-09 | Discharge: 2016-05-10 | Disposition: A | Discharge status: Home / Self Care | Payer: Medicaid Other | Attending: Emergency Medicine | Admitting: Emergency Medicine

## 2016-05-09 DIAGNOSIS — R102 Pelvic and perineal pain: Secondary | ICD-10-CM | POA: Insufficient documentation

## 2016-05-09 DIAGNOSIS — O26891 Other specified pregnancy related conditions, first trimester: Secondary | ICD-10-CM | POA: Diagnosis present

## 2016-05-09 DIAGNOSIS — F17210 Nicotine dependence, cigarettes, uncomplicated: Secondary | ICD-10-CM | POA: Insufficient documentation

## 2016-05-09 DIAGNOSIS — Z3A01 Less than 8 weeks gestation of pregnancy: Secondary | ICD-10-CM | POA: Insufficient documentation

## 2016-05-09 DIAGNOSIS — Z3491 Encounter for supervision of normal pregnancy, unspecified, first trimester: Secondary | ICD-10-CM

## 2016-05-09 LAB — CBC
HCT: 39.3 % (ref 35.0–47.0)
HEMOGLOBIN: 13.2 g/dL (ref 12.0–16.0)
MCH: 29.9 pg (ref 26.0–34.0)
MCHC: 33.7 g/dL (ref 32.0–36.0)
MCV: 88.7 fL (ref 80.0–100.0)
Platelets: 263 10*3/uL (ref 150–440)
RBC: 4.43 MIL/uL (ref 3.80–5.20)
RDW: 13.2 % (ref 11.5–14.5)
WBC: 14.2 10*3/uL — ABNORMAL HIGH (ref 3.6–11.0)

## 2016-05-09 NOTE — ED Provider Notes (Signed)
[facility] Emergency Department Provider Note   First MD Initiated Contact with Patient 05/09/16 2336     (approximate)  I have reviewed the triage vital signs and the nursing notes.   HISTORY  Chief Complaint Abdominal Pain    HPI Robyn Barker is a 41 y.o. female with history of approximately 6    previous pregnancies including one ectopic requiring right salpingectomy presents with pelvic discomfort which started yesterday with a positive home pregnancy test performed yesterday. Patient states last LMP was 04/07/2016. Patient denies any vaginal bleeding. Patient's pain score this time is 3 out of 10. Patient denies any nausea no vomiting. Patient denies any dysuria   Past Medical History:  Diagnosis Date  . High cholesterol     There are no active problems to display for this patient.   Past Surgical History:  Procedure Laterality Date  . DIAGNOSTIC LAPAROSCOPY WITH REMOVAL OF ECTOPIC PREGNANCY N/A 05/15/2015   Procedure: DIAGNOSTIC LAPAROSCOPY WITH REMOVAL OF ECTOPIC PREGNANCY;  Surgeon: Reva Boresanya S Pratt, MD;  Location: [facility];  Service: Gynecology;  Laterality: N/A;  . ovarian cyst removed      Prior to Admission medications   Medication Sig Start Date End Date Taking? Authorizing Provider  acetaminophen (TYLENOL) 500 MG tablet Take 1,000 mg by mouth every 6 (six) hours as needed.    Historical Provider, MD  butalbital-acetaminophen-caffeine (FIORICET, ESGIC) 50-325-40 MG tablet Take 1-2 tablets by mouth every 6 (six) hours as needed for headache. 02/13/16 02/12/17  Rebecka ApleyAllison P Webster, MD  Cholecalciferol (VITAMIN D3) 5000 UNITS CAPS Take 5,000 Units by mouth daily.    Historical Provider, MD  fluticasone (FLONASE) 50 MCG/ACT nasal spray SPRAY 2 SPRAYS INTO EACH NOSTRIL 2 TIMES DAILY 05/06/15   Historical Provider, MD  ibuprofen (ADVIL,MOTRIN) 800 MG tablet Take 800 mg by mouth 3 (three) times daily as needed. 05/06/15   Historical Provider, MD    oxyCODONE-acetaminophen (PERCOCET/ROXICET) 5-325 MG tablet Take 1-2 tablets by mouth every 6 (six) hours as needed. 05/21/15   Reva Boresanya S Pratt, MD  oxyCODONE-acetaminophen (ROXICET) 5-325 MG tablet Take 1 tablet by mouth every 4 (four) hours as needed for severe pain. 09/01/15   Darci Currentandolph N Brown, MD  PROAIR HFA 108 [phone](90 Base) MCG/ACT inhaler INHALE 2 PUFFS EVERY FOUR TO SIX HOURS AS NEEDED 05/10/15   Historical Provider, MD  QVAR 80 MCG/ACT inhaler TAKE 2 PUFFS 2 TIMES A DAY 05/10/15   Historical Provider, MD  simvastatin (ZOCOR) 20 MG tablet Take 20 mg by mouth daily.    Historical Provider, MD  traMADol (ULTRAM) 50 MG tablet Take 1 tablet (50 mg total) by mouth every 6 (six) hours as needed. 02/13/16   Rebecka ApleyAllison P Webster, MD    Allergies No known drug allergies  Family History  Problem Relation Age of Onset  . Breast cancer Maternal Aunt     Social History Social History  Substance Use Topics  . Smoking status: Current Every Day Smoker    Packs/day: 1.00    Years: 15.00    Types: Cigarettes  . Smokeless tobacco: Never Used  . Alcohol use Yes     Comment: occasionally    Review of Systems Constitutional: No fever/chills Eyes: No visual changes. ENT: No sore throat. Cardiovascular: Denies chest pain. Respiratory: Denies shortness of breath. Gastrointestinal: No abdominal pain.  No nausea, no vomiting.  No diarrhea.  No constipation. Genitourinary: Negative for dysuria. Musculoskeletal: Negative for back pain. Skin: Negative for rash. Neurological: Negative  for headaches, focal weakness or numbness.  10-point ROS otherwise negative.  ____________________________________________   PHYSICAL EXAM:  VITAL SIGNS: ED Triage Vitals  Enc Vitals Group     BP 05/09/16 2310 (!) 137/97     Pulse Rate 05/09/16 2310 97     Resp 05/09/16 2310 20     Temp 05/09/16 2310 98.5 F (36.9 C)     Temp Source 05/09/16 2310 Oral     SpO2 05/09/16 2310 99 %     Weight 05/09/16 2311 190 lb  (86.2 kg)     Height 05/09/16 2311 5' 1.5" (1.562 m)     Head Circumference --      Peak Flow --      Pain Score 05/09/16 2310 3     Pain Loc --      Pain Edu? --      Excl. in GC? --     Constitutional: Alert and oriented. Well appearing and in no acute distress. Eyes: Conjunctivae are normal. PERRL. EOMI. Head: Atraumatic. Mouth/Throat: Mucous membranes are moist.  Oropharynx non-erythematous. Neck: No stridor.   Cardiovascular: Normal rate, regular rhythm. Good peripheral circulation. Grossly normal heart sounds. Respiratory: Normal respiratory effort.  No retractions. Lungs CTAB. Gastrointestinal: Soft and nontender. No distention.  Musculoskeletal: No lower extremity tenderness nor edema. No gross deformities of extremities. Neurologic:  Normal speech and language. No gross focal neurologic deficits are appreciated.  Skin:  Skin is warm, dry and intact. No rash noted. Psychiatric: Mood and affect are normal. Speech and behavior are normal.  ____________________________________________   LABS (all labs ordered are listed, but only abnormal results are displayed)  Labs Reviewed  HCG, QUANTITATIVE, PREGNANCY - Abnormal; Notable for the following:       Result Value   hCG, Beta Chain, Quant, S 40 (*)    All other components within normal limits  CBC - Abnormal; Notable for the following:    WBC 14.2 (*)    All other components within normal limits  COMPREHENSIVE METABOLIC PANEL - Abnormal; Notable for the following:    Glucose, Bld 152 (*)    All other components within normal limits  URINALYSIS, COMPLETE (UACMP) WITH MICROSCOPIC - Abnormal; Notable for the following:    Color, Urine YELLOW (*)    APPearance CLEAR (*)    Glucose, UA 50 (*)    Hgb urine dipstick MODERATE (*)    Squamous Epithelial / LPF 0-5 (*)    All other components within normal limits  PREGNANCY, URINE - Abnormal; Notable for the following:    Preg Test, Ur POSITIVE (*)    All other components  within normal limits    RADIOLOGY I, Fitzhugh N BROWN, personally viewed and evaluated these images (plain radiographs) as part of my medical decision making, as well as reviewing the written report by the radiologist.  US Ob Comp Less 14 Wks  Result Date: 05/10/2016 CLINICAL DATA:  56 -year-old female with positive urine pregnancy test presenting with pelvic pain. History of ectopic pregnancy with left salpingectomy. LMP: 04/11/2016 corresponding to an estimated gestational age of [redacted] weeks, 1 day EXAM: OBSTETRIC <14 WK ULTRASOUND TECHNIQUE: Transabdominal ultrasound was performed for evaluation of the gestation as well as the maternal uterus and adnexal regions. COMPARISON:  None for this pregnancy FINDINGS: The uterus is anteverted and heterogeneous. There are 2 intramural fibroids with the largest measuring 2.6 x 2.4 x 2.4 cm. The endometrium is thickened measuring 17 mm. No intrauterine pregnancy identified. No abnormal endometrial  vascularity noted. Findings may represent an early pregnancy, recent spontaneous abortion, or an ectopic pregnancy. Correlation with clinical exam and follow-up with serial HCG levels and ultrasound recommended. The left ovary measures 4.4 x 2.1 x 2.1 cm for a volume of 10 cc. There is slight prominence of the left ovary on 1 image (41/69). No discrete mass identified. However if there is clinical concern for ectopic pregnancy in the region of the left ovary further evaluation with repeat ultrasound and compression in the region of the left ovary by ultrasound probe recommended to exclude the possibility of a para ovarian mass/ectopic. The right ovary measures 4.1 x 2.0 x 2.8 cm for a volume of 12 cc. No free fluid noted within the pelvis. IMPRESSION: 1. No intrauterine pregnancy identified. Differentials include early pregnancy, recent spontaneous abortion, or possible ectopic pregnancy. Correlation with clinical exam and follow-up with serial HCG levels and ultrasound  recommended. 2. Somewhat prominent appearance of the left ovary seen on 1 image. If there is clinical concern for ectopic pregnancy in the region of the left ovary further evaluation of the left ovary with ultrasound and compression technique is recommended. 3. No free fluid within the pelvis. 4. Small uterine fibroids. Electronically Signed   By: Elgie Collard M.D.   On: 05/10/2016 02:12   US Ob Transvaginal  Result Date: 05/10/2016 CLINICAL DATA:  41 -year-old female with positive urine pregnancy test presenting with pelvic pain. History of ectopic pregnancy with left salpingectomy. LMP: 04/11/2016 corresponding to an estimated gestational age of [redacted] weeks, 1 day EXAM: OBSTETRIC <14 WK ULTRASOUND TECHNIQUE: Transabdominal ultrasound was performed for evaluation of the gestation as well as the maternal uterus and adnexal regions. COMPARISON:  None for this pregnancy FINDINGS: The uterus is anteverted and heterogeneous. There are 2 intramural fibroids with the largest measuring 2.6 x 2.4 x 2.4 cm. The endometrium is thickened measuring 17 mm. No intrauterine pregnancy identified. No abnormal endometrial vascularity noted. Findings may represent an early pregnancy, recent spontaneous abortion, or an ectopic pregnancy. Correlation with clinical exam and follow-up with serial HCG levels and ultrasound recommended. The left ovary measures 4.4 x 2.1 x 2.1 cm for a volume of 10 cc. There is slight prominence of the left ovary on 1 image (41/69). No discrete mass identified. However if there is clinical concern for ectopic pregnancy in the region of the left ovary further evaluation with repeat ultrasound and compression in the region of the left ovary by ultrasound probe recommended to exclude the possibility of a para ovarian mass/ectopic. The right ovary measures 4.1 x 2.0 x 2.8 cm for a volume of 12 cc. No free fluid noted within the pelvis. IMPRESSION: 1. No intrauterine pregnancy identified. Differentials  include early pregnancy, recent spontaneous abortion, or possible ectopic pregnancy. Correlation with clinical exam and follow-up with serial HCG levels and ultrasound recommended. 2. Somewhat prominent appearance of the left ovary seen on 1 image. If there is clinical concern for ectopic pregnancy in the region of the left ovary further evaluation of the left ovary with ultrasound and compression technique is recommended. 3. No free fluid within the pelvis. 4. Small uterine fibroids. Electronically Signed   By: Elgie Collard M.D.   On: 05/10/2016 02:12      Procedures   ____________________________________________   INITIAL IMPRESSION / ASSESSMENT AND PLAN / ED COURSE  Pertinent labs & imaging results that were available during my care of the patient were reviewed by me and considered in my medical decision making (  see chart for details).  41 year old female presenting with pelvic discomfort none at present with positive pregnancy tests noted yesterday. Patient denies any vaginal bleeding. Patient hCG Quant 40 ultrasound revealed no intrauterine pregnancy. Spoke with the patient at length regarding the necessity of following up with OB/GYN within 48 hours. Patient is advised to return to the emergency department immediately for any pain or vaginal bleeding were to ensue. Patient told specifically that ectopic pregnancy cannot be ruled out at this time given the low hCG Quant.      ____________________________________________  FINAL CLINICAL IMPRESSION(S) / ED DIAGNOSES  Final diagnoses:  First trimester pregnancy     MEDICATIONS GIVEN DURING THIS VISIT:  Medications - No data to display   NEW OUTPATIENT MEDICATIONS STARTED DURING THIS VISIT:  Discharge Medication List as of 05/10/2016  4:01 AM      Discharge Medication List as of 05/10/2016  4:01 AM      Discharge Medication List as of 05/10/2016  4:01 AM       Note:  This document was prepared using Dragon voice  recognition software and may include unintentional dictation errors.    Darci Current, MD 05/10/16 [phone]

## 2016-05-09 NOTE — ED Triage Notes (Signed)
Patient ambulatory to triage with steady gait, without difficulty or distress noted; pt reports lower abd pain since yesterday with no accomp symptoms; st positive home pregnancy test last night

## 2016-05-10 ENCOUNTER — Emergency Department: Payer: Medicaid Other

## 2016-05-10 LAB — COMPREHENSIVE METABOLIC PANEL
ALBUMIN: 3.8 g/dL (ref 3.5–5.0)
ALK PHOS: 42 U/L (ref 38–126)
ALT: 16 U/L (ref 14–54)
ANION GAP: 6 (ref 5–15)
AST: 23 U/L (ref 15–41)
BUN: 11 mg/dL (ref 6–20)
CALCIUM: 9.4 mg/dL (ref 8.9–10.3)
CO2: 23 mmol/L (ref 22–32)
Chloride: 106 mmol/L (ref 101–111)
Creatinine, Ser: 0.87 mg/dL (ref 0.44–1.00)
GFR calc Af Amer: >60 mL/min (ref >60)
GLUCOSE: 152 mg/dL — AB (ref 65–99)
Potassium: 3.5 mmol/L (ref 3.5–5.1)
Sodium: 135 mmol/L (ref 135–145)
TOTAL PROTEIN: 7 g/dL (ref 6.5–8.1)
Total Bilirubin: 0.4 mg/dL (ref 0.3–1.2)

## 2016-05-10 LAB — HCG, QUANTITATIVE, PREGNANCY: hCG, Beta Chain, Quant, S: 40 m[IU]/mL — ABNORMAL HIGH (ref <5)

## 2016-05-10 LAB — URINALYSIS, COMPLETE (UACMP) WITH MICROSCOPIC
Bacteria, UA: NONE SEEN
Bilirubin Urine: NEGATIVE
Glucose, UA: 50 mg/dL — AB
Ketones, ur: NEGATIVE mg/dL
Leukocytes, UA: NEGATIVE
Nitrite: NEGATIVE
Protein, ur: NEGATIVE mg/dL
Specific Gravity, Urine: 1.023 (ref 1.005–1.030)
pH: 5 (ref 5.0–8.0)

## 2016-05-10 LAB — PREGNANCY, URINE: Preg Test, Ur: POSITIVE — AB

## 2016-05-10 NOTE — ED Notes (Signed)
Pt discharged to home.  Discharge instructions reviewed.  Verbalized understanding.  No questions or concerns at this time.  Teach back verified.  Pt in NAD.  No items left in ED.   

## 2016-05-13 ENCOUNTER — Emergency Department
Admission: EM | Admit: 2016-05-13 | Discharge: 2016-05-13 | Disposition: A | Discharge status: Home / Self Care | Payer: Medicaid Other | Attending: Emergency Medicine | Admitting: Emergency Medicine

## 2016-05-13 ENCOUNTER — Encounter: Payer: Self-pay | Admitting: Emergency Medicine

## 2016-05-13 DIAGNOSIS — Z3A00 Weeks of gestation of pregnancy not specified: Secondary | ICD-10-CM | POA: Diagnosis not present

## 2016-05-13 DIAGNOSIS — O200 Threatened abortion: Secondary | ICD-10-CM

## 2016-05-13 DIAGNOSIS — F17200 Nicotine dependence, unspecified, uncomplicated: Secondary | ICD-10-CM | POA: Diagnosis not present

## 2016-05-13 DIAGNOSIS — O99330 Smoking (tobacco) complicating pregnancy, unspecified trimester: Secondary | ICD-10-CM | POA: Diagnosis not present

## 2016-05-13 LAB — COMPREHENSIVE METABOLIC PANEL
ALBUMIN: 3.8 g/dL (ref 3.5–5.0)
ALK PHOS: 44 U/L (ref 38–126)
ALT: 15 U/L (ref 14–54)
AST: 20 U/L (ref 15–41)
Anion gap: 7 (ref 5–15)
BILIRUBIN TOTAL: 0.4 mg/dL (ref 0.3–1.2)
BUN: 10 mg/dL (ref 6–20)
CALCIUM: 8.9 mg/dL (ref 8.9–10.3)
CO2: 23 mmol/L (ref 22–32)
CREATININE: 0.92 mg/dL (ref 0.44–1.00)
Chloride: 107 mmol/L (ref 101–111)
GFR calc non Af Amer: >60 mL/min (ref >60)
GLUCOSE: 111 mg/dL — AB (ref 65–99)
Potassium: 3.8 mmol/L (ref 3.5–5.1)
SODIUM: 137 mmol/L (ref 135–145)
TOTAL PROTEIN: 7.3 g/dL (ref 6.5–8.1)

## 2016-05-13 LAB — CBC WITH DIFFERENTIAL/PLATELET
BASOS PCT: 1 %
Basophils Absolute: 0.1 10*3/uL (ref 0–0.1)
Eosinophils Absolute: 0.4 10*3/uL (ref 0–0.7)
Eosinophils Relative: 4 %
HEMATOCRIT: 40.3 % (ref 35.0–47.0)
HEMOGLOBIN: 13.5 g/dL (ref 12.0–16.0)
LYMPHS PCT: 21 %
Lymphs Abs: 2.5 10*3/uL (ref 1.0–3.6)
MCH: 29.9 pg (ref 26.0–34.0)
MCHC: 33.5 g/dL (ref 32.0–36.0)
MCV: 89.4 fL (ref 80.0–100.0)
MONO ABS: 0.8 10*3/uL (ref 0.2–0.9)
MONOS PCT: 7 %
NEUTROS ABS: 8 10*3/uL — AB (ref 1.4–6.5)
NEUTROS PCT: 67 %
Platelets: 276 10*3/uL (ref 150–440)
RBC: 4.51 MIL/uL (ref 3.80–5.20)
RDW: 13.4 % (ref 11.5–14.5)
WBC: 11.7 10*3/uL — ABNORMAL HIGH (ref 3.6–11.0)

## 2016-05-13 LAB — HCG, QUANTITATIVE, PREGNANCY: hCG, Beta Chain, Quant, S: 119 m[IU]/mL — ABNORMAL HIGH (ref <5)

## 2016-05-13 NOTE — ED Notes (Signed)
Pt ambulatory at time of d/c and denies questions.

## 2016-05-13 NOTE — ED Triage Notes (Signed)
Patient to ER for repeat ultrasound, further investigation of possible ectopic pregnancy. Patient has had left fallopian tube removed, but had ultrasound on 05/10/16 that showed left ovarian enlargement with positive pregnancy test.

## 2016-05-13 NOTE — ED Provider Notes (Signed)
[facility] Emergency Department Provider Note  ____________________________________________   I have reviewed the triage vital signs and the nursing notes.   HISTORY  Chief Complaint Pregnancy complication    HPI Robyn Barker is a 41 y.o. female presents today complaining of requiring a recheck for possible ectopic she was seen here on 5 AM on the 21st of this month and had a Quant of 40 ultrasound showed possible something on the left ovary. She cannot exactly when her last period was. She dates it was "near the end of last month. Patient is 6 or 7 times pregnant in her life with 4 children and one ectopic. She states that she did have a right salpingectomy in the past for ectopic. She is having no pain. She states that she is having some spotting.  Patient is here to have a repeat check as she was for some reason either disinclined or unable to go to OB.   Past Medical History:  Diagnosis Date  . High cholesterol     There are no active problems to display for this patient.   Past Surgical History:  Procedure Laterality Date  . DIAGNOSTIC LAPAROSCOPY WITH REMOVAL OF ECTOPIC PREGNANCY N/A 05/15/2015   Procedure: DIAGNOSTIC LAPAROSCOPY WITH REMOVAL OF ECTOPIC PREGNANCY;  Surgeon: Reva Boresanya S Pratt, MD;  Location: [facility];  Service: Gynecology;  Laterality: N/A;  . ovarian cyst removed    . SALPINGECTOMY Left     Prior to Admission medications   Medication Sig Start Date End Date Taking? Authorizing Provider  Cholecalciferol (VITAMIN D3) 5000 UNITS CAPS Take 5,000 Units by mouth daily.   Yes Historical Provider, MD  fluticasone (FLONASE) 50 MCG/ACT nasal spray SPRAY 2 SPRAYS INTO EACH NOSTRIL 2 TIMES DAILY 05/06/15  Yes Historical Provider, MD  PROAIR HFA 108 (90 Base) MCG/ACT inhaler INHALE 2 PUFFS EVERY FOUR TO SIX HOURS AS NEEDED 05/10/15  Yes Historical Provider, MD  simvastatin (ZOCOR) 20 MG tablet Take 20 mg by mouth daily.   Yes Historical Provider,  MD  butalbital-acetaminophen-caffeine (FIORICET, ESGIC) 50-325-40 MG tablet Take 1-2 tablets by mouth every 6 (six) hours as needed for headache. Patient not taking: Reported on 05/13/2016 02/13/16 02/12/17  Rebecka ApleyAllison P Webster, MD  oxyCODONE-acetaminophen (PERCOCET/ROXICET) 5-325 MG tablet Take 1-2 tablets by mouth every 6 (six) hours as needed. Patient not taking: Reported on 05/13/2016 05/21/15   Reva Boresanya S Pratt, MD  oxyCODONE-acetaminophen (ROXICET) 5-325 MG tablet Take 1 tablet by mouth every 4 (four) hours as needed for severe pain. Patient not taking: Reported on 05/13/2016 09/01/15   Darci Currentandolph N Brown, MD  traMADol (ULTRAM) 50 MG tablet Take 1 tablet (50 mg total) by mouth every 6 (six) hours as needed. Patient not taking: Reported on 05/13/2016 02/13/16   Rebecka ApleyAllison P Webster, MD    Allergies Patient has no known allergies.  Family History  Problem Relation Age of Onset  . Breast cancer Maternal Aunt     Social History Social History  Substance Use Topics  . Smoking status: Current Every Day Smoker    Packs/day: 1.00    Years: 15.00    Types: Cigarettes  . Smokeless tobacco: Never Used  . Alcohol use Yes     Comment: occasionally    Review of Systems Constitutional: No fever/chills Eyes: No visual changes. ENT: No sore throat. No stiff neck no neck pain Cardiovascular: Denies chest pain. Respiratory: Denies shortness of breath. Gastrointestinal:   no vomiting.  No diarrhea.  No constipation. Genitourinary: Negative  for dysuria. Musculoskeletal: Negative lower extremity swelling Skin: Negative for rash. Neurological: Negative for severe headaches, focal weakness or numbness. 10-point ROS otherwise negative.  ____________________________________________   PHYSICAL EXAM:  VITAL SIGNS: ED Triage Vitals  Enc Vitals Group     BP 05/13/16 0946 123/71     Pulse Rate 05/13/16 0946 98     Resp 05/13/16 0946 18     Temp 05/13/16 0946 98.6 F (37 C)     Temp Source 05/13/16  0946 Oral     SpO2 05/13/16 0946 98 %     Weight 05/13/16 0946 190 lb (86.2 kg)     Height 05/13/16 0946 5' 1.5" (1.562 m)     Head Circumference --      Peak Flow --      Pain Score 05/13/16 0947 0     Pain Loc --      Pain Edu? --      Excl. in GC? --     Constitutional: Alert and oriented. Well appearing and in no acute distress. Eyes: Conjunctivae are normal. PERRL. EOMI. Head: Atraumatic. Nose: No congestion/rhinnorhea. Mouth/Throat: Mucous membranes are moist.  Oropharynx non-erythematous. Neck: No stridor.   Nontender with no meningismus Cardiovascular: Normal rate, regular rhythm. Grossly normal heart sounds.  Good peripheral circulation. Respiratory: Normal respiratory effort.  No retractions. Lungs CTAB. Abdominal: Soft and nontender. No distention. No guarding no rebound Back:  There is no focal tenderness or step off.  there is no midline tenderness there are no lesions noted. there is no CVA tenderness Musculoskeletal: No lower extremity tenderness, no upper extremity tenderness. No joint effusions, no DVT signs strong distal pulses no edema Neurologic:  Normal speech and language. No gross focal neurologic deficits are appreciated.  Skin:  Skin is warm, dry and intact. No rash noted. Psychiatric: Mood and affect are normal. Speech and behavior are normal.  ____________________________________________   LABS (all labs ordered are listed, but only abnormal results are displayed)  Labs Reviewed  CBC WITH DIFFERENTIAL/PLATELET - Abnormal; Notable for the following:       Result Value   WBC 11.7 (*)    Neutro Abs 8.0 (*)    All other components within normal limits  HCG, QUANTITATIVE, PREGNANCY - Abnormal; Notable for the following:    hCG, Beta Chain, Quant, S 119 (*)    All other components within normal limits  COMPREHENSIVE METABOLIC PANEL - Abnormal; Notable for the following:    Glucose, Bld 111 (*)    All other components within normal limits    ____________________________________________  EKG  I personally interpreted any EKGs ordered by me or triage  ____________________________________________  RADIOLOGY  I reviewed any imaging ordered by me or triage that were performed during my shift and, if possible, patient and/or family made aware of any abnormal findings. ____________________________________________   PROCEDURES  Procedure(s) performed: None  Procedures  Critical Care performed: None  ____________________________________________   INITIAL IMPRESSION / ASSESSMENT AND PLAN / ED COURSE  Pertinent labs & imaging results that were available during my care of the patient were reviewed by me and considered in my medical decision making (see chart for details).  Patient is Rh+ Sharene Butters has gone up in the last 3 days from 4219. This certainly could represent a viable pregnancy. She does not have any lateralizing signs or abdominal pain. She does have mild spotting. Ultrasound was reviewed. I did discuss with Dr. Valentino Saxon, she does not feel that repeat imaging is indicated at this  time. With a Quant of 119, is still very unlikely that we'll see anything and given that the patient is not having any pain she does not think a repeat ultrasound will change management. Patient herself is in no acute distress. Her Quant is going up and there is still a possibility this could be a viable pregnancy. She has no idea when her last menstrual period was making it difficult to fully interpret these numbers. I have extensively and emphatically reviewed return precautions to the emergency department and the need for close outpatient follow-up with OB with the patient and she understands all of this. I did offer and advised pelvic exam the patient refuses at this time. She states she did not go to her OB appointment because of issues with Medicaid but she now has her Medicaid and she tells me that she will follow-up. Again have multiple times  emphasized the return precautions.    ____________________________________________   FINAL CLINICAL IMPRESSION(S) / ED DIAGNOSES  Final diagnoses:  None      This chart was dictated using voice recognition software.  Despite best efforts to proofread,  errors can occur which can change meaning.      Jeanmarie Plant, MD 05/13/16 [phone]

## 2016-05-13 NOTE — Discharge Instructions (Signed)
It is absolutely vital that you follow up closely with OB/GYN on Monday. Call them first thing in the morning and tell them exactly what has been going on and they will see on Monday. Tell them Dr. Valentino Saxonherry would like you seen. If you have increased pain, bleeding more than a pad an hour, or any other new or worrisome symptoms return immediately to the emergency department. You  would prefer not to have a pelvic exam at this time, this is certainly your choice but it limits our ability to evaluate you. This only adds to our desire that you should follow up as directed.

## 2016-05-13 NOTE — ED Notes (Signed)
Pt laid back in stretcher,no distress noted, laughing and talking on her cell phone, pt informed that we had orders to draw blood. Pt stated that she didn't want it drawn from her hand, and pointed to her Ac where her previous blood work had been done, stated that she wanted it drawn from there, pt denies pain, states that she was seen here in the ER a couple days ago and was told to follow up with an OB-GYN,states that her medicaid isn't correct and she was unable to get an appointment, pt states that the ER told her to return in a couple days for a re-check if she couldn't be seen by an OB.

## 2016-05-16 ENCOUNTER — Encounter: Payer: Self-pay | Admitting: Obstetrics and Gynecology

## 2016-05-18 ENCOUNTER — Inpatient Hospital Stay (HOSPITAL_COMMUNITY): Payer: Medicaid Other

## 2016-05-18 ENCOUNTER — Encounter: Payer: Self-pay | Admitting: Obstetrics and Gynecology

## 2016-05-18 ENCOUNTER — Encounter (HOSPITAL_COMMUNITY): Payer: Self-pay | Admitting: *Deleted

## 2016-05-18 ENCOUNTER — Inpatient Hospital Stay (EMERGENCY_DEPARTMENT_HOSPITAL)
Admission: AD | Admit: 2016-05-18 | Discharge: 2016-05-19 | Disposition: A | Discharge status: Home / Self Care | Payer: Medicaid Other | Source: Ambulatory Visit | Attending: Obstetrics and Gynecology | Admitting: Obstetrics and Gynecology

## 2016-05-18 ENCOUNTER — Inpatient Hospital Stay (HOSPITAL_COMMUNITY)
Admission: AD | Admit: 2016-05-18 | Discharge: 2016-05-18 | Disposition: A | Discharge status: Home / Self Care | Payer: Medicaid Other | Source: Ambulatory Visit | Attending: Obstetrics and Gynecology | Admitting: Obstetrics and Gynecology

## 2016-05-18 DIAGNOSIS — O009 Unspecified ectopic pregnancy without intrauterine pregnancy: Secondary | ICD-10-CM | POA: Diagnosis not present

## 2016-05-18 DIAGNOSIS — Z3A01 Less than 8 weeks gestation of pregnancy: Secondary | ICD-10-CM | POA: Insufficient documentation

## 2016-05-18 DIAGNOSIS — R102 Pelvic and perineal pain: Secondary | ICD-10-CM | POA: Diagnosis present

## 2016-05-18 DIAGNOSIS — F17210 Nicotine dependence, cigarettes, uncomplicated: Secondary | ICD-10-CM | POA: Insufficient documentation

## 2016-05-18 DIAGNOSIS — R109 Unspecified abdominal pain: Secondary | ICD-10-CM

## 2016-05-18 DIAGNOSIS — O3680X0 Pregnancy with inconclusive fetal viability, not applicable or unspecified: Secondary | ICD-10-CM

## 2016-05-18 LAB — COMPREHENSIVE METABOLIC PANEL
ALT: 16 U/L (ref 14–54)
ANION GAP: 6 (ref 5–15)
AST: 16 U/L (ref 15–41)
Albumin: 4 g/dL (ref 3.5–5.0)
Alkaline Phosphatase: 42 U/L (ref 38–126)
BUN: 9 mg/dL (ref 6–20)
CHLORIDE: 106 mmol/L (ref 101–111)
CO2: 22 mmol/L (ref 22–32)
Calcium: 8.6 mg/dL — ABNORMAL LOW (ref 8.9–10.3)
Creatinine, Ser: 0.71 mg/dL (ref 0.44–1.00)
Glucose, Bld: 107 mg/dL — ABNORMAL HIGH (ref 65–99)
POTASSIUM: 3.5 mmol/L (ref 3.5–5.1)
SODIUM: 134 mmol/L — AB (ref 135–145)
Total Bilirubin: 0.4 mg/dL (ref 0.3–1.2)
Total Protein: 7.4 g/dL (ref 6.5–8.1)

## 2016-05-18 LAB — URINALYSIS, ROUTINE W REFLEX MICROSCOPIC
BILIRUBIN URINE: NEGATIVE
Bacteria, UA: NONE SEEN
GLUCOSE, UA: NEGATIVE mg/dL
KETONES UR: NEGATIVE mg/dL
Nitrite: NEGATIVE
PROTEIN: NEGATIVE mg/dL
Specific Gravity, Urine: 1.02 (ref 1.005–1.030)
pH: 5 (ref 5.0–8.0)

## 2016-05-18 LAB — HCG, QUANTITATIVE, PREGNANCY: hCG, Beta Chain, Quant, S: 329 m[IU]/mL — ABNORMAL HIGH (ref <5)

## 2016-05-18 LAB — CBC
HCT: 39.1 % (ref 36.0–46.0)
Hemoglobin: 13.1 g/dL (ref 12.0–15.0)
MCH: 29.9 pg (ref 26.0–34.0)
MCHC: 33.5 g/dL (ref 30.0–36.0)
MCV: 89.3 fL (ref 78.0–100.0)
PLATELETS: 330 10*3/uL (ref 150–400)
RBC: 4.38 MIL/uL (ref 3.87–5.11)
RDW: 13.1 % (ref 11.5–15.5)
WBC: 13.5 10*3/uL — ABNORMAL HIGH (ref 4.0–10.5)

## 2016-05-18 MED ORDER — METHOTREXATE INJECTION FOR WOMEN'S HOSPITAL
50.0000 mg/m2 | Freq: Once | INTRAMUSCULAR | Status: AC
  Administered 2016-05-19: 100 mg via INTRAMUSCULAR
  Filled 2016-05-18: qty 2

## 2016-05-18 MED ORDER — OXYCODONE-ACETAMINOPHEN 5-325 MG PO TABS
2.0000 | ORAL_TABLET | Freq: Once | ORAL | Status: AC
  Administered 2016-05-18: 2 via ORAL
  Filled 2016-05-18: qty 2

## 2016-05-18 NOTE — Discharge Instructions (Signed)
Ectopic Pregnancy °An ectopic pregnancy is when the fertilized egg attaches (implants) outside the uterus. Most ectopic pregnancies occur in one of the tubes where eggs travel from the ovary to the uterus (fallopian tubes), but the implanting can occur in other locations. In rare cases, ectopic pregnancies occur on the ovary, intestine, pelvis, abdomen, or cervix. In an ectopic pregnancy, the fertilized egg does not have the ability to develop into a normal, healthy baby. °A ruptured ectopic pregnancy is one in which tearing or bursting of a fallopian tube causes internal bleeding. Often, there is intense lower abdominal pain, and vaginal bleeding sometimes occurs. Having an ectopic pregnancy can be life-threatening. If this dangerous condition is not treated, it can lead to blood loss, shock, or even death. °What are the causes? °The most common cause of this condition is damage to one of the fallopian tubes. A fallopian tube may be narrowed or blocked, and that keeps the fertilized egg from reaching the uterus. °What increases the risk? °This condition is more likely to develop in women of childbearing age who have different levels of risk. The levels of risk can be divided into three categories. °High risk  °· You have gone through infertility treatment. °· You have had an ectopic pregnancy before. °· You have had surgery on the fallopian tubes, or another surgical procedure, such as an abortion. °· You have had surgery to have the fallopian tubes tied (tubal ligation). °· You have problems or diseases of the fallopian tubes. °· You have been exposed to diethylstilbestrol (DES). This medicine was used until 1971, and it had effects on babies whose mothers took the medicine. °· You become pregnant while using an IUD (intrauterine device) for birth control. °Moderate risk  °· You have a history of infertility. °· You have had an STI (sexually transmitted infection). °· You have a history of pelvic inflammatory  disease (PID). °· You have scarring from endometriosis. °· You have multiple sexual partners. °· You smoke. °Low risk  °· You have had pelvic surgery. °· You use vaginal douches. °· You became sexually active before age 18. °What are the signs or symptoms? °Common symptoms of this condition include normal pregnancy symptoms, such as missing a period, nausea, tiredness, abdominal pain, breast tenderness, and bleeding. However, ectopic pregnancy will have additional symptoms, such as: °· Pain with intercourse. °· Irregular vaginal bleeding or spotting. °· Cramping or pain on one side or in the lower abdomen. °· Fast heartbeat, low blood pressure, and sweating. °· Passing out while having a bowel movement. °Symptoms of a ruptured ectopic pregnancy and internal bleeding may include: °· Sudden, severe pain in the abdomen and pelvis. °· Dizziness, weakness, light-headedness, or fainting. °· Pain in the shoulder or neck area. °How is this diagnosed? °This condition is diagnosed by: °· A pelvic exam to locate pain or a mass in the abdomen. °· A pregnancy test. This blood test checks for the presence as well as the specific level of pregnancy hormone in the bloodstream. °· Ultrasound. This is performed if a pregnancy test is positive. In this test, a probe is inserted into the vagina. The probe will detect a fetus, possibly in a location other than the uterus. °· Taking a sample of uterus tissue (dilation and curettage, or D&C). °· Surgery to perform a visual exam of the inside of the abdomen using a thin, lighted tube that has a tiny camera on the end (laparoscope). °· Culdocentesis. This procedure involves inserting a needle at the   top of the vagina, behind the uterus. If blood is present in this area, it may indicate that a fallopian tube is torn. How is this treated? This condition is treated with medicine or surgery. Medicine   An injection of a medicine (methotrexate) may be given to cause the pregnancy tissue to  be absorbed. This medicine may save your fallopian tube. It may be given if:  The diagnosis is made early, with no signs of active bleeding.  The fallopian tube has not ruptured.  You are considered to be a good candidate for the medicine. Usually, pregnancy hormone blood levels are checked after methotrexate treatment. This is to be sure that the medicine is effective. It may take 4-6 weeks for the pregnancy to be absorbed. Most pregnancies will be absorbed by 3 weeks. Surgery   A laparoscope may be used to remove the pregnancy tissue.  If severe internal bleeding occurs, a larger cut (incision) may be made in the lower abdomen (laparotomy) to remove the fetus and placenta. This is done to stop the bleeding.  Part or all of the fallopian tube may be removed (salpingectomy) along with the fetus and placenta. The fallopian tube may also be repaired during the surgery.  In very rare circumstances, removal of the uterus (hysterectomy) may be required.  After surgery, pregnancy hormone testing may be done to be sure that there is no pregnancy tissue left. Whether your treatment is medicine or surgery, you may receive a Rho (D) immune globulin shot to prevent problems with any future pregnancy. This shot may be given if:  You are Rh-negative and the baby's father is Rh-positive.  You are Rh-negative and you do not know the Rh type of the baby's father. Follow these instructions at home:  Rest and limit your activity after the procedure for as long as told by your health care provider.  Until your health care provider says that it is safe:  Do not lift anything that is heavier than 10 lb (4.5 kg), or the limit that your health care provider tells you.  Avoid physical exercise and any movement that requires effort (is strenuous).  To help prevent constipation:  Eat a healthy diet that includes fruits, vegetables, and whole grains.  Drink 6-8 glasses of water per day. Get help right  away if:  You develop worsening pain that is not relieved by medicine.  You have:  A fever or chills.  Vaginal bleeding.  Redness and swelling at the incision site.  Nausea and vomiting.  You feel dizzy or weak.  You feel light-headed or you faint. This information is not intended to replace advice given to you by your health care provider. Make sure you discuss any questions you have with your health care provider. Document Released: 03/16/2004 Document Revised: 10/06/2015 Document Reviewed: 09/08/2015 Elsevier Interactive Patient Education  2017 [employer] Abdominal Pain During Pregnancy Belly (abdominal) pain is common during pregnancy. Most of the time, it is not a serious problem. Other times, it can be a sign that something is wrong with the pregnancy. Always tell your doctor if you have belly pain. Follow these instructions at home: Monitor your belly pain for any changes. The following actions may help you feel better:  Do not have sex (intercourse) or put anything in your vagina until you feel better.  Rest until your pain stops.  Drink clear fluids if you feel sick to your stomach (nauseous). Do not eat solid food until you feel better.  Only  take medicine as told by your doctor.  Keep all doctor visits as told. Get help right away if:  You are bleeding, leaking fluid, or pieces of tissue come out of your vagina.  You have more pain or cramping.  You keep throwing up (vomiting).  You have pain when you pee (urinate) or have blood in your pee.  You have a fever.  You do not feel your baby moving as much.  You feel very weak or feel like passing out.  You have trouble breathing, with or without belly pain.  You have a very bad headache and belly pain.  You have fluid leaking from your vagina and belly pain.  You keep having watery poop (diarrhea).  Your belly pain does not go away after resting, or the pain gets worse. This information is not  intended to replace advice given to you by your health care provider. Make sure you discuss any questions you have with your health care provider. Document Released: 01/25/2009 Document Revised: 09/15/2015 Document Reviewed: 09/05/2012 Elsevier Interactive Patient Education  2017 [employer]

## 2016-05-18 NOTE — MAU Provider Note (Signed)
History     CSN: [id number]  Arrival date and time: 05/18/16 1017   None     Chief Complaint  Patient presents with  . Abdominal Pain   HPI Robyn Barker is a 41 yo G5P3013 at around 5 weeks 6 days by unsure LMP.  She presents today for central, lower abdominal cramping pain that started this morning. Initially 9/10, improved to 3/10 with 400 mg ibuprofen that she took prior to arrival.  Not associated with vaginal discharge, vaginal bleeding, fever, dysuria, flank pain, nausea.    She has a history of ectopic pregnancy and right salpingectomy about 1 year ago.  She was seen for this complaint 3/20 (quant 40, TVUS without IUP; prominence of left ovary without discrete mass) and 3/24 (quant 119).  She has not followed up to establish OB care yet.   OB History    Gravida Para Term Preterm AB Living   5 3 3   1 3    SAB TAB Ectopic Multiple Live Births       1          Past Medical History:  Diagnosis Date  . High cholesterol     Past Surgical History:  Procedure Laterality Date  . DIAGNOSTIC LAPAROSCOPY WITH REMOVAL OF ECTOPIC PREGNANCY N/A 05/15/2015   Procedure: DIAGNOSTIC LAPAROSCOPY WITH REMOVAL OF ECTOPIC PREGNANCY;  Surgeon: Reva Boresanya S Pratt, MD;  Location: [facility];  Service: Gynecology;  Laterality: N/A;  . ovarian cyst removed    . SALPINGECTOMY Left   . TUBAL LIGATION      Family History  Problem Relation Age of Onset  . Breast cancer Maternal Aunt     Social History  Substance Use Topics  . Smoking status: Current Every Day Smoker    Packs/day: 1.00    Years: 15.00    Types: Cigarettes  . Smokeless tobacco: Never Used  . Alcohol use Yes     Comment: occasionally    Allergies: No Known Allergies  Prescriptions Prior to Admission  Medication Sig Dispense Refill Last Dose  . Cholecalciferol (VITAMIN D3) 5000 UNITS CAPS Take 5,000 Units by mouth daily.   Past Month at Unknown time  . PROAIR HFA 108 (90 Base) MCG/ACT inhaler INHALE 2 PUFFS EVERY FOUR TO  SIX HOURS AS NEEDED  5 Past Month at Unknown time  . simvastatin (ZOCOR) 20 MG tablet Take 20 mg by mouth daily.   Past Month at Unknown time  . butalbital-acetaminophen-caffeine (FIORICET, ESGIC) 50-325-40 MG tablet Take 1-2 tablets by mouth every 6 (six) hours as needed for headache. (Patient not taking: Reported on 05/13/2016) 20 tablet 0 Not Taking at Unknown time  . traMADol (ULTRAM) 50 MG tablet Take 1 tablet (50 mg total) by mouth every 6 (six) hours as needed. (Patient not taking: Reported on 05/13/2016) 12 tablet 0 Not Taking at Unknown time    Review of Systems  Constitutional: Negative for fever.  Respiratory: Negative for shortness of breath.   Cardiovascular: Negative for chest pain and leg swelling.  Gastrointestinal: Positive for abdominal pain. Negative for constipation, diarrhea and nausea.  Genitourinary: Negative for dysuria, urgency, vaginal bleeding and vaginal discharge.   Physical Exam   Blood pressure 119/79, pulse 87, temperature 98.7 F (37.1 C), height 5' 1.5" (1.562 m), weight 203 lb 0.6 oz (92.1 kg), last menstrual period 04/07/2016.  Physical Exam  Constitutional: She is oriented to person, place, and time. She appears well-developed and well-nourished. No distress.  HENT:  Head: Normocephalic.  Right  Ear: External ear normal.  Left Ear: External ear normal.  Mouth/Throat: Oropharynx is clear and moist.  Eyes: Conjunctivae and EOM are normal.  Neck: Normal range of motion. Neck supple.  Cardiovascular: Normal rate and regular rhythm.   No murmur heard. Respiratory: Effort normal and breath sounds normal. She has no wheezes. She has no rales.  GI: Soft.  Mild suprapubic discomfort, otherwise non-tender abdomen, no CVA tenderness  Musculoskeletal: She exhibits no edema.  Neurological: She is alert and oriented to person, place, and time.  Skin: Skin is warm and dry.  Psychiatric: She has a normal mood and affect.    MAU Course   Procedures  MDM  Serum quant 329  Transvaginal US:   No intrauterine gestation identified.  Findings are compatible with pregnancy of unknown location. Differential diagnosis includes early intrauterine pregnancy too early to visualize, spontaneous abortion, and ectopic pregnancy. Serial quantitative beta HCG and or followup ultrasound recommended to definitively exclude ectopic pregnancy.  Small uterine fibroids.  Reassessment of pain at 1445:  Still 3/10, no vaginal bleeding, has not worsened.  Assessment and Plan  Robyn Barker is a 41 yo G5P3013 at unknown gestation (likely <6 weeks) who presents with abdominal pain in setting of h/o ectopic pregnancy.  -cannot rule out ectopic based on TVUS without IUP visualized -hcg is not doubling -concern for possible ectopic, but may be too early to see IUP vs early miscarriage -gave patient option to have methotrexate today; prefers to defer to see if hcg doubling in 2 days.  Return to MAU at that time. -discussed risks of continuing this pregnancy if it is ectopic:  Pain, bleeding, fallopian tube rupture, death  Seen and discussed with NP Elenora Fender.  [name] 05/18/2016, 12:04 PM   I confirm that I have verified the information documented in the resident's note and that I have also personally reperformed the physical exam and all medical decision making activities.Also, explained in detail that it is highly likely that abnormal pregnancy and reviewed risks of ectopic including increased blood loss and death.  After explanation patient still desires to wait and repeat beta HCG on Saturday.  Eino Farber Kennith Gain, CNM

## 2016-05-18 NOTE — MAU Note (Signed)
PT  SAYS SHE WAS HERE  THIS  AM -   LABS  AND  U/S  -   TOO EARLY   .  NO PAIN  WHEN  SHE  LEFT.         STARTED  HURTING   CONSTANT  CRAMPING   745PM-  LOWER ABD   .   PLAN  WAS TO RETURN  ON SAT.     NO BLOODY .

## 2016-05-18 NOTE — MAU Note (Signed)
Pain started around 1945 tonight, here because pain is getting worse. Pt. Was seen this morning around 1000 a.m. And left around 1500.

## 2016-05-18 NOTE — MAU Provider Note (Signed)
History     CSN: [id number]  Arrival date and time: 05/18/16 2159   First Provider Initiated Contact with Patient 05/18/16 2249      Chief Complaint  Patient presents with  . Pelvic Pain   Robyn Barker is a 41 y.o. [phone] at [redacted]w[redacted]d who presents today with lower abdominal pain. She states that she was seen here earlier and she was offered MTX. She wanted to wait until Saturday, but she has changed her mind. She would like to have MTX today.   Pelvic Pain  The patient's primary symptoms include pelvic pain. This is a new problem. The current episode started today. The problem occurs constantly. The pain is severe. The problem affects both sides. She is pregnant. Nothing aggravates the symptoms. She has tried nothing for the symptoms. Menstrual history: LMP 04/07/16      Past Medical History:  Diagnosis Date  . High cholesterol     Past Surgical History:  Procedure Laterality Date  . DIAGNOSTIC LAPAROSCOPY WITH REMOVAL OF ECTOPIC PREGNANCY N/A 05/15/2015   Procedure: DIAGNOSTIC LAPAROSCOPY WITH REMOVAL OF ECTOPIC PREGNANCY;  Surgeon: Reva Bores, MD;  Location: [facility];  Service: Gynecology;  Laterality: N/A;  . ovarian cyst removed    . SALPINGECTOMY Left     Family History  Problem Relation Age of Onset  . Breast cancer Maternal Aunt     Social History  Substance Use Topics  . Smoking status: Current Every Day Smoker    Packs/day: 1.00    Years: 15.00    Types: Cigarettes  . Smokeless tobacco: Never Used  . Alcohol use Yes     Comment: occasionally    Allergies: No Known Allergies  Prescriptions Prior to Admission  Medication Sig Dispense Refill Last Dose  . butalbital-acetaminophen-caffeine (FIORICET, ESGIC) 50-325-40 MG tablet Take 1-2 tablets by mouth every 6 (six) hours as needed for headache. (Patient not taking: Reported on 05/13/2016) 20 tablet 0 Not Taking at Unknown time  . Cholecalciferol (VITAMIN D3) 5000 UNITS CAPS Take 5,000 Units by mouth daily.    Past Month at Unknown time  . PROAIR HFA 108 (90 Base) MCG/ACT inhaler INHALE 2 PUFFS EVERY FOUR TO SIX HOURS AS NEEDED  5 Past Month at Unknown time  . simvastatin (ZOCOR) 20 MG tablet Take 20 mg by mouth daily.   Past Month at Unknown time    Review of Systems  Genitourinary: Positive for pelvic pain.   Physical Exam   Blood pressure 123/74, pulse 85, temperature 98.6 F (37 C), resp. rate 20, height 5\' 1"  (1.549 m), weight 202 lb (91.6 kg), last menstrual period 04/07/2016.  Physical Exam  Nursing note and vitals reviewed. Constitutional: She is oriented to person, place, and time. She appears well-developed and well-nourished. No distress.  HENT:  Head: Normocephalic.  Cardiovascular: Normal rate.   Respiratory: Effort normal.  GI: Soft. There is no tenderness. There is no rebound.  Neurological: She is alert and oriented to person, place, and time.  Skin: Skin is warm and dry.  Psychiatric: She has a normal mood and affect.   Results for orders placed or performed during the hospital encounter of 05/18/16 (from the past 24 hour(s))  CBC     Status: Abnormal   Collection Time: 05/18/16 11:11 PM  Result Value Ref Range   WBC 13.5 (H) 4.0 - 10.5 K/uL   RBC 4.38 3.87 - 5.11 MIL/uL   Hemoglobin 13.1 12.0 - 15.0 g/dL   HCT 14.7 82.9 -  46.0 %   MCV 89.3 78.0 - 100.0 fL   MCH 29.9 26.0 - 34.0 pg   MCHC 33.5 30.0 - 36.0 g/dL   RDW 16.113.1 09.611.5 - 04.515.5 %   Platelets 330 150 - 400 K/uL  Comprehensive metabolic panel     Status: Abnormal   Collection Time: 05/18/16 11:11 PM  Result Value Ref Range   Sodium 134 (L) 135 - 145 mmol/L   Potassium 3.5 3.5 - 5.1 mmol/L   Chloride 106 101 - 111 mmol/L   CO2 22 22 - 32 mmol/L   Glucose, Bld 107 (H) 65 - 99 mg/dL   BUN 9 6 - 20 mg/dL   Creatinine, Ser 4.090.71 0.44 - 1.00 mg/dL   Calcium 8.6 (L) 8.9 - 10.3 mg/dL   Total Protein 7.4 6.5 - 8.1 g/dL   Albumin 4.0 3.5 - 5.0 g/dL   AST 16 15 - 41 U/L   ALT 16 14 - 54 U/L   Alkaline  Phosphatase 42 38 - 126 U/L   Total Bilirubin 0.4 0.3 - 1.2 mg/dL   GFR calc non Af Amer >60 >60 mL/Robyn   GFR calc Af Amer >60 >60 mL/Robyn   Anion gap 6 5 - 15    MAU Course  Procedures  MDM 2254: DW Dr. Jolayne Pantheronstant ok for MTX at this time. Patient was offered earlier today. R/B/A discussed with the patient. Questions answered. Close follow up needed and patient states that she can keep FU appointments   Assessment and Plan   1. Ectopic pregnancy without intrauterine pregnancy, unspecified location    DC home Comfort measures reviewed  Ectopic precautions RX: none  Return to MAU as needed   Follow-up Information    [facility].   Specialty:  Obstetrics and Gynecology Why:  Monday morning at 0900 for repeat blood work  Contact information: [address] [place] [id number] [phone]           Tawnya CrookHogan, Saamiya Jeppsen Donovan 05/18/2016, 10:51 PM

## 2016-05-18 NOTE — MAU Note (Signed)
Pt reports she started having abd pain this morning. Took some ibuprofen with some releif.. Had positive pregnancy test at [facility].  Did u/s  Unable to see pregnancy. Had Ectopic pregnancy last year and had to have her left tube removed.

## 2016-05-19 ENCOUNTER — Other Ambulatory Visit: Payer: Self-pay | Admitting: Family

## 2016-05-19 DIAGNOSIS — O009 Unspecified ectopic pregnancy without intrauterine pregnancy: Secondary | ICD-10-CM

## 2016-05-19 NOTE — Discharge Instructions (Signed)
Ectopic Pregnancy °An ectopic pregnancy happens when a fertilized egg grows outside the uterus. A pregnancy cannot live outside of the uterus. This problem often happens in the fallopian tube. It is often caused by damage to the fallopian tube. °If this problem is found early, you may be treated with medicine. If your tube tears or bursts open (ruptures), you will bleed inside. This is an emergency. You will need surgery. Get help right away. °What are the signs or symptoms? °You may have normal pregnancy symptoms at first. These include: °· Missing your period. °· Feeling sick to your stomach (nauseous). °· Being tired. °· Having tender breasts. ° °Then, you may start to have symptoms that are not normal. These include: °· Pain with sex (intercourse). °· Bleeding from the vagina. This includes light bleeding (spotting). °· Belly (abdomen) or lower belly cramping or pain. This may be felt on one side. °· A fast heartbeat (pulse). °· Passing out (fainting) after going poop (bowel movement). ° °If your tube tears, you may have symptoms such as: °· Really bad pain in the belly or lower belly. This happens suddenly. °· Dizziness. °· Passing out. °· Shoulder pain. ° °Get help right away if: °You have any of these symptoms. This is an emergency. °This information is not intended to replace advice given to you by your health care provider. Make sure you discuss any questions you have with your health care provider. °Document Released: 05/05/2008 Document Revised: 07/15/2015 Document Reviewed: 09/18/2012 °Elsevier Interactive Patient Education © 2017 Elsevier Inc. ° °

## 2016-05-20 ENCOUNTER — Inpatient Hospital Stay (HOSPITAL_COMMUNITY): Payer: Medicaid Other | Admitting: Anesthesiology

## 2016-05-20 ENCOUNTER — Encounter (HOSPITAL_COMMUNITY)
Admission: AD | Disposition: A | Discharge status: Home / Self Care | Payer: Self-pay | Source: Ambulatory Visit | Attending: Obstetrics & Gynecology

## 2016-05-20 ENCOUNTER — Encounter (HOSPITAL_COMMUNITY): Payer: Self-pay

## 2016-05-20 ENCOUNTER — Ambulatory Visit (HOSPITAL_COMMUNITY)
Admission: AD | Admit: 2016-05-20 | Discharge: 2016-05-20 | Disposition: A | Discharge status: Home / Self Care | Payer: Medicaid Other | Source: Ambulatory Visit | Attending: Obstetrics & Gynecology | Admitting: Obstetrics & Gynecology

## 2016-05-20 ENCOUNTER — Inpatient Hospital Stay (HOSPITAL_COMMUNITY): Payer: Medicaid Other

## 2016-05-20 DIAGNOSIS — E7800 Pure hypercholesterolemia, unspecified: Secondary | ICD-10-CM | POA: Diagnosis not present

## 2016-05-20 DIAGNOSIS — K661 Hemoperitoneum: Secondary | ICD-10-CM | POA: Insufficient documentation

## 2016-05-20 DIAGNOSIS — O009 Unspecified ectopic pregnancy without intrauterine pregnancy: Secondary | ICD-10-CM | POA: Diagnosis present

## 2016-05-20 DIAGNOSIS — O00102 Left tubal pregnancy without intrauterine pregnancy: Secondary | ICD-10-CM | POA: Diagnosis not present

## 2016-05-20 DIAGNOSIS — O00109 Unspecified tubal pregnancy without intrauterine pregnancy: Secondary | ICD-10-CM | POA: Diagnosis present

## 2016-05-20 DIAGNOSIS — R109 Unspecified abdominal pain: Secondary | ICD-10-CM

## 2016-05-20 DIAGNOSIS — F17210 Nicotine dependence, cigarettes, uncomplicated: Secondary | ICD-10-CM | POA: Diagnosis not present

## 2016-05-20 DIAGNOSIS — O99332 Smoking (tobacco) complicating pregnancy, second trimester: Secondary | ICD-10-CM | POA: Diagnosis not present

## 2016-05-20 DIAGNOSIS — O26891 Other specified pregnancy related conditions, first trimester: Secondary | ICD-10-CM

## 2016-05-20 DIAGNOSIS — Z3A01 Less than 8 weeks gestation of pregnancy: Secondary | ICD-10-CM | POA: Diagnosis not present

## 2016-05-20 DIAGNOSIS — O3680X0 Pregnancy with inconclusive fetal viability, not applicable or unspecified: Secondary | ICD-10-CM

## 2016-05-20 HISTORY — PX: DIAGNOSTIC LAPAROSCOPY WITH REMOVAL OF ECTOPIC PREGNANCY: SHX6449

## 2016-05-20 HISTORY — PX: UNILATERAL SALPINGECTOMY: SHX6160

## 2016-05-20 HISTORY — DX: Unspecified tubal pregnancy without intrauterine pregnancy: O00.109

## 2016-05-20 LAB — CBC WITH DIFFERENTIAL/PLATELET
BASOS PCT: 0 %
Basophils Absolute: 0 10*3/uL (ref 0.0–0.1)
Eosinophils Absolute: 0.5 10*3/uL (ref 0.0–0.7)
Eosinophils Relative: 3 %
HEMATOCRIT: 41.1 % (ref 36.0–46.0)
Hemoglobin: 13.9 g/dL (ref 12.0–15.0)
Lymphocytes Relative: 17 %
Lymphs Abs: 3 10*3/uL (ref 0.7–4.0)
MCH: 30.5 pg (ref 26.0–34.0)
MCHC: 33.8 g/dL (ref 30.0–36.0)
MCV: 90.1 fL (ref 78.0–100.0)
MONO ABS: 0.7 10*3/uL (ref 0.1–1.0)
MONOS PCT: 4 %
NEUTROS ABS: 12.9 10*3/uL — AB (ref 1.7–7.7)
Neutrophils Relative %: 76 %
Platelets: 346 10*3/uL (ref 150–400)
RBC: 4.56 MIL/uL (ref 3.87–5.11)
RDW: 13.3 % (ref 11.5–15.5)
WBC: 17.1 10*3/uL — ABNORMAL HIGH (ref 4.0–10.5)

## 2016-05-20 LAB — URINALYSIS, ROUTINE W REFLEX MICROSCOPIC
BACTERIA UA: NONE SEEN
Bilirubin Urine: NEGATIVE
Glucose, UA: NEGATIVE mg/dL
Ketones, ur: NEGATIVE mg/dL
Leukocytes, UA: NEGATIVE
NITRITE: NEGATIVE
Protein, ur: NEGATIVE mg/dL
SPECIFIC GRAVITY, URINE: 1.026 (ref 1.005–1.030)
pH: 5 (ref 5.0–8.0)

## 2016-05-20 LAB — TYPE AND SCREEN
ABO/RH(D): A POS
ANTIBODY SCREEN: NEGATIVE

## 2016-05-20 LAB — HCG, QUANTITATIVE, PREGNANCY: hCG, Beta Chain, Quant, S: 285 m[IU]/mL — ABNORMAL HIGH (ref <5)

## 2016-05-20 LAB — ABO/RH: ABO/RH(D): A POS

## 2016-05-20 SURGERY — LAPAROSCOPY, WITH ECTOPIC PREGNANCY SURGICAL TREATMENT
Anesthesia: General | Site: Abdomen | Laterality: Left

## 2016-05-20 MED ORDER — MEPERIDINE HCL 25 MG/ML IJ SOLN: 6.2500 mg | INTRAMUSCULAR | Status: DC | PRN

## 2016-05-20 MED ORDER — LIDOCAINE HCL (CARDIAC) 20 MG/ML IV SOLN
INTRAVENOUS | Status: AC
Start: 2016-05-20 — End: 2016-05-20
  Filled 2016-05-20: qty 5

## 2016-05-20 MED ORDER — PROPOFOL 10 MG/ML IV BOLUS
INTRAVENOUS | Status: DC | PRN
Start: 2016-05-20 — End: 2016-05-20
  Administered 2016-05-20: 180 mg via INTRAVENOUS

## 2016-05-20 MED ORDER — OXYCODONE-ACETAMINOPHEN 5-325 MG PO TABS: 1.0000 | ORAL_TABLET | Freq: Four times a day (QID) | ORAL | 0 refills | Status: DC | PRN

## 2016-05-20 MED ORDER — DEXAMETHASONE SODIUM PHOSPHATE 4 MG/ML IJ SOLN
INTRAMUSCULAR | Status: AC
Start: 2016-05-20 — End: 2016-05-20
  Filled 2016-05-20: qty 1

## 2016-05-20 MED ORDER — ONDANSETRON HCL 4 MG/2ML IJ SOLN
INTRAMUSCULAR | Status: DC | PRN
  Administered 2016-05-20: 4 mg via INTRAVENOUS

## 2016-05-20 MED ORDER — ROCURONIUM BROMIDE 100 MG/10ML IV SOLN
INTRAVENOUS | Status: AC
  Filled 2016-05-20: qty 1

## 2016-05-20 MED ORDER — PROPOFOL 10 MG/ML IV BOLUS
INTRAVENOUS | Status: AC
  Filled 2016-05-20: qty 20

## 2016-05-20 MED ORDER — GLYCOPYRROLATE 0.2 MG/ML IJ SOLN
INTRAMUSCULAR | Status: AC
  Filled 2016-05-20: qty 3

## 2016-05-20 MED ORDER — DEXAMETHASONE SODIUM PHOSPHATE 10 MG/ML IJ SOLN
INTRAMUSCULAR | Status: DC | PRN
Start: 2016-05-20 — End: 2016-05-20
  Administered 2016-05-20: 4 mg via INTRAVENOUS

## 2016-05-20 MED ORDER — HYDROMORPHONE HCL 1 MG/ML IJ SOLN: 0.2500 mg | INTRAMUSCULAR | Status: DC | PRN

## 2016-05-20 MED ORDER — DOCUSATE SODIUM 100 MG PO CAPS: 100.0000 mg | ORAL_CAPSULE | Freq: Two times a day (BID) | ORAL | 2 refills | Status: DC | PRN

## 2016-05-20 MED ORDER — ONDANSETRON HCL 4 MG/2ML IJ SOLN
INTRAMUSCULAR | Status: AC
  Filled 2016-05-20: qty 2

## 2016-05-20 MED ORDER — MIDAZOLAM HCL 2 MG/2ML IJ SOLN
INTRAMUSCULAR | Status: AC
  Filled 2016-05-20: qty 2

## 2016-05-20 MED ORDER — KETOROLAC TROMETHAMINE 30 MG/ML IJ SOLN
INTRAMUSCULAR | Status: AC
  Filled 2016-05-20: qty 1

## 2016-05-20 MED ORDER — LACTATED RINGERS IR SOLN
Status: DC | PRN
  Administered 2016-05-20: 3000 mL

## 2016-05-20 MED ORDER — HYDROCODONE-ACETAMINOPHEN 7.5-325 MG PO TABS
1.0000 | ORAL_TABLET | Freq: Once | ORAL | Status: AC | PRN
  Administered 2016-05-20: 1 via ORAL

## 2016-05-20 MED ORDER — ROCURONIUM BROMIDE 100 MG/10ML IV SOLN
INTRAVENOUS | Status: DC | PRN
  Administered 2016-05-20: 40 mg via INTRAVENOUS
  Administered 2016-05-20: 10 mg via INTRAVENOUS

## 2016-05-20 MED ORDER — LACTATED RINGERS IV BOLUS (SEPSIS)
1000.0000 mL | Freq: Once | INTRAVENOUS | Status: AC
  Administered 2016-05-20: 1000 mL via INTRAVENOUS

## 2016-05-20 MED ORDER — SUCCINYLCHOLINE CHLORIDE 20 MG/ML IJ SOLN
INTRAMUSCULAR | Status: DC | PRN
  Administered 2016-05-20: 120 mg via INTRAVENOUS

## 2016-05-20 MED ORDER — IBUPROFEN 600 MG PO TABS: 600.0000 mg | ORAL_TABLET | Freq: Four times a day (QID) | ORAL | 3 refills | Status: DC | PRN

## 2016-05-20 MED ORDER — SUGAMMADEX SODIUM 200 MG/2ML IV SOLN
INTRAVENOUS | Status: DC | PRN
  Administered 2016-05-20: 181.4 mg via INTRAVENOUS

## 2016-05-20 MED ORDER — METOCLOPRAMIDE HCL 5 MG/ML IJ SOLN: 10.0000 mg | Freq: Once | INTRAMUSCULAR | Status: DC | PRN

## 2016-05-20 MED ORDER — HYDROMORPHONE HCL 1 MG/ML IJ SOLN
1.0000 mg | Freq: Once | INTRAMUSCULAR | Status: AC
  Administered 2016-05-20: 1 mg via INTRAMUSCULAR
  Filled 2016-05-20: qty 1

## 2016-05-20 MED ORDER — FAMOTIDINE IN NACL 20-0.9 MG/50ML-% IV SOLN
20.0000 mg | Freq: Once | INTRAVENOUS | Status: AC
  Administered 2016-05-20: 20 mg via INTRAVENOUS
  Filled 2016-05-20: qty 50

## 2016-05-20 MED ORDER — MIDAZOLAM HCL 2 MG/2ML IJ SOLN
INTRAMUSCULAR | Status: DC | PRN
  Administered 2016-05-20: 2 mg via INTRAVENOUS

## 2016-05-20 MED ORDER — FENTANYL CITRATE (PF) 100 MCG/2ML IJ SOLN
INTRAMUSCULAR | Status: DC | PRN
  Administered 2016-05-20 (×2): 50 ug via INTRAVENOUS
  Administered 2016-05-20: 150 ug via INTRAVENOUS

## 2016-05-20 MED ORDER — FENTANYL CITRATE (PF) 100 MCG/2ML IJ SOLN
INTRAMUSCULAR | Status: AC
  Filled 2016-05-20: qty 2

## 2016-05-20 MED ORDER — SOD CITRATE-CITRIC ACID 500-334 MG/5ML PO SOLN
30.0000 mL | Freq: Once | ORAL | Status: AC
  Administered 2016-05-20: 30 mL via ORAL
  Filled 2016-05-20: qty 15

## 2016-05-20 MED ORDER — FENTANYL CITRATE (PF) 250 MCG/5ML IJ SOLN
INTRAMUSCULAR | Status: AC
  Filled 2016-05-20: qty 5

## 2016-05-20 MED ORDER — LACTATED RINGERS IV SOLN
INTRAVENOUS | Status: DC
  Administered 2016-05-20 (×3): via INTRAVENOUS

## 2016-05-20 MED ORDER — BUPIVACAINE HCL (PF) 0.5 % IJ SOLN
INTRAMUSCULAR | Status: DC | PRN
  Administered 2016-05-20: 10 mL

## 2016-05-20 MED ORDER — LIDOCAINE HCL (CARDIAC) 20 MG/ML IV SOLN
INTRAVENOUS | Status: DC | PRN
  Administered 2016-05-20: 80 mg via INTRAVENOUS

## 2016-05-20 MED ORDER — SUGAMMADEX SODIUM 200 MG/2ML IV SOLN
INTRAVENOUS | Status: AC
Start: 2016-05-20 — End: 2016-05-20
  Filled 2016-05-20: qty 2

## 2016-05-20 SURGICAL SUPPLY — 28 items
CABLE HIGH FREQUENCY MONO STRZ (ELECTRODE) IMPLANT
CATH ROBINSON RED A/P 16FR (CATHETERS) IMPLANT
CLOTH BEACON ORANGE TIMEOUT ST (SAFETY) ×3 IMPLANT
DERMABOND ADVANCED (GAUZE/BANDAGES/DRESSINGS) ×1
DERMABOND ADVANCED .7 DNX12 (GAUZE/BANDAGES/DRESSINGS) ×2 IMPLANT
DURAPREP 26ML APPLICATOR (WOUND CARE) ×3 IMPLANT
GLOVE BIO SURGEON STRL SZ 6.5 (GLOVE) ×3 IMPLANT
GLOVE BIOGEL PI IND STRL 7.0 (GLOVE) ×8 IMPLANT
GLOVE BIOGEL PI INDICATOR 7.0 (GLOVE) ×4
GLOVE ECLIPSE 7.0 STRL STRAW (GLOVE) ×3 IMPLANT
GOWN STRL REUS W/TWL LRG LVL3 (GOWN DISPOSABLE) ×6 IMPLANT
NS IRRIG 1000ML POUR BTL (IV SOLUTION) IMPLANT
PACK LAPAROSCOPY BASIN (CUSTOM PROCEDURE TRAY) ×3 IMPLANT
PACK TRENDGUARD 450 HYBRID PRO (MISCELLANEOUS) ×2 IMPLANT
PACK TRENDGUARD 600 HYBRD PROC (MISCELLANEOUS) IMPLANT
POUCH SPECIMEN RETRIEVAL 10MM (ENDOMECHANICALS) IMPLANT
PROTECTOR NERVE ULNAR (MISCELLANEOUS) ×6 IMPLANT
SET IRRIG TUBING LAPAROSCOPIC (IRRIGATION / IRRIGATOR) ×3 IMPLANT
SHEARS HARMONIC ACE PLUS 36CM (ENDOMECHANICALS) ×3 IMPLANT
SLEEVE XCEL OPT CAN 5 100 (ENDOMECHANICALS) ×3 IMPLANT
SUT MNCRL AB 4-0 PS2 18 (SUTURE) ×3 IMPLANT
SUT VICRYL 0 UR6 27IN ABS (SUTURE) ×6 IMPLANT
TOWEL OR 17X24 6PK STRL BLUE (TOWEL DISPOSABLE) ×6 IMPLANT
TRAY FOLEY CATH SILVER 14FR (SET/KITS/TRAYS/PACK) ×3 IMPLANT
TRENDGUARD 450 HYBRID PRO PACK (MISCELLANEOUS) ×3
TRENDGUARD 600 HYBRID PROC PK (MISCELLANEOUS)
TROCAR XCEL NON-BLD 11X100MML (ENDOMECHANICALS) ×3 IMPLANT
TROCAR XCEL NON-BLD 5MMX100MML (ENDOMECHANICALS) ×3 IMPLANT

## 2016-05-20 NOTE — Transfer of Care (Signed)
Immediate Anesthesia Transfer of Care Note  Patient: Robyn Barker  Procedure(s) Performed: Procedure(s): DIAGNOSTIC LAPAROSCOPY (Bilateral) UNILATERAL SALPINGECTOMY (Left)  Patient Location: PACU  Anesthesia Type:General  Level of Consciousness: awake, alert  and oriented  Airway & Oxygen Therapy: Patient Spontanous Breathing and Patient connected to nasal cannula oxygen  Post-op Assessment: Report given to RN, Post -op Vital signs reviewed and stable and Patient moving all extremities  Post vital signs: Reviewed and stable  Last Vitals:  Vitals:   05/20/16 0654  BP: 123/71  Pulse: (!) 102  Resp: 20  Temp: 36.8 C    Last Pain:  Vitals:   05/20/16 1000  PainSc: 5       Patients Stated Pain Goal: 0 (05/20/16 1000)  Complications: No apparent anesthesia complications

## 2016-05-20 NOTE — MAU Note (Signed)
Seen here Thurs and received MTX. Sharp pains lower abd which has not really let up since Thurs. Tylenol is not helping. Cannot sleep. No bleeding since THurs. Last BM THurs

## 2016-05-20 NOTE — Discharge Instructions (Signed)
Please remove dressings in 2 days.  No shower for 24hrs. No baths until seen at follow up appt. Drink enough liquids to keep urine light yellow to clear. It may burn when urinating for 24hrs from the catheter during surgery.  General Anesthesia, Adult, Care After These instructions provide you with information about caring for yourself after your procedure. Your health care provider may also give you more specific instructions. Your treatment has been planned according to current medical practices, but problems sometimes occur. Call your health care provider if you have any problems or questions after your procedure. What can I expect after the procedure? After the procedure, it is common to have:  Vomiting.  A sore throat.  Mental slowness. It is common to feel:  Nauseous.  Cold or shivery.  Sleepy.  Tired.  Sore or achy, even in parts of your body where you did not have surgery. Follow these instructions at home: For at least 24 hours after the procedure:   Do not:  Participate in activities where you could fall or become injured.  Drive.  Use heavy machinery.  Drink alcohol.  Take sleeping pills or medicines that cause drowsiness.  Make important decisions or sign legal documents.  Take care of children on your own.  Rest. Eating and drinking   If you vomit, drink water, juice, or soup when you can drink without vomiting.  Drink enough fluid to keep your urine clear or pale yellow.  Make sure you have little or no nausea before eating solid foods.  Follow the diet recommended by your health care provider. General instructions   Have a responsible adult stay with you until you are awake and alert.  Return to your normal activities as told by your health care provider. Ask your health care provider what activities are safe for you.  Take over-the-counter and prescription medicines only as told by your health care provider.  If you smoke, do not smoke  without supervision.  Keep all follow-up visits as told by your health care provider. This is important. Contact a health care provider if:  You continue to have nausea or vomiting at home, and medicines are not helpful.  You cannot drink fluids or start eating again.  You cannot urinate after 8-12 hours.  You develop a skin rash.  You have fever.  You have increasing redness at the site of your procedure. Get help right away if:  You have difficulty breathing.  You have chest pain.  You have unexpected bleeding.  You feel that you are having a life-threatening or urgent problem. This information is not intended to replace advice given to you by your health care provider. Make sure you discuss any questions you have with your health care provider. Document Released: 05/15/2000 Document Revised: 07/12/2015 Document Reviewed: 01/21/2015 Elsevier Interactive Patient Education  2017 Elsevier Inc. Diagnostic Laparoscopy, Care After Refer to this sheet in the next few weeks. These instructions provide you with information about caring for yourself after your procedure. Your health care provider may also give you more specific instructions. Your treatment has been planned according to current medical practices, but problems sometimes occur. Call your health care provider if you have any problems or questions after your procedure. What can I expect after the procedure? After your procedure, it is common to have mild discomfort in the throat and abdomen. Follow these instructions at home:  Take over-the-counter and prescription medicines only as told by your health care provider.  Do not drive  for 24 hours if you received a sedative.  Return to your normal activities as told by your health care provider.  Do not take baths, swim, or use a hot tub until your health care provider approves. You may shower.  Follow instructions from your health care provider about how to take care of  your incision. Make sure you:  Wash your hands with soap and water before you change your bandage (dressing). If soap and water are not available, use hand sanitizer.  Change your dressing as told by your health care provider.  Leave stitches (sutures), skin glue, or adhesive strips in place. These skin closures may need to stay in place for 2 weeks or longer. If adhesive strip edges start to loosen and curl up, you may trim the loose edges. Do not remove adhesive strips completely unless your health care provider tells you to do that.  Check your incision area every day for signs of infection. Check for:  More redness, swelling, or pain.  More fluid or blood.  Warmth.  Pus or a bad smell.  It is your responsibility to get the results of your procedure. Ask your health care provider or the department performing the procedure when your results will be ready. Contact a health care provider if:  There is new pain in your shoulders.  You feel light-headed or faint.  You are unable to pass gas or unable to have a bowel movement.  You feel nauseous or you vomit.  You develop a rash.  You have more redness, swelling, or pain around your incision.  You have more fluid or blood coming from your incision.  Your incision feels warm to the touch.  You have pus or a bad smell coming from your incision.  You have a fever or chills. Get help right away if:  Your pain is getting worse.  You have ongoing vomiting.  The edges of your incision open up.  You have trouble breathing.  You have chest pain. This information is not intended to replace advice given to you by your health care provider. Make sure you discuss any questions you have with your health care provider. Document Released: 01/18/2015 Document Revised: 07/15/2015 Document Reviewed: 10/20/2014 Elsevier Interactive Patient Education  2017 [employer].

## 2016-05-20 NOTE — Anesthesia Procedure Notes (Signed)
Procedure Name: Intubation Date/Time: 05/20/2016 12:37 PM Performed by: Hewitt Blade Pre-anesthesia Checklist: Patient identified, Emergency Drugs available, Suction available and Patient being monitored Patient Re-evaluated:Patient Re-evaluated prior to inductionOxygen Delivery Method: Circle system utilized Preoxygenation: Pre-oxygenation with 100% oxygen Intubation Type: IV induction, Cricoid Pressure applied and Rapid sequence Laryngoscope Size: Mac and 3 Grade View: Grade I Tube type: Oral Tube size: 7.0 mm Number of attempts: 1 Airway Equipment and Method: Stylet Placement Confirmation: ETT inserted through vocal cords under direct vision,  positive ETCO2 and breath sounds checked- equal and bilateral Secured at: 21 cm Tube secured with: Tape Dental Injury: Teeth and Oropharynx as per pre-operative assessment

## 2016-05-20 NOTE — Op Note (Signed)
Robyn Barker PROCEDURE DATE: 05/20/2016  PREOPERATIVE DIAGNOSIS: Left ectopic pregnancy POSTOPERATIVE DIAGNOSIS: Ruptured left fallopian tube ectopic pregnancy PROCEDURE: Laparoscopic left salpingectomy and removal of ectopic pregnancy SURGEON:  Dr. Jaynie Collins ANESTHESIOLOGIST: Mal Amabile, MD Anesthesiologist: Mal Amabile, MD CRNA: Robyn Congo, CRNA  INDICATIONS: 41 y.o. [phone] at [redacted]w[redacted]d here with the preoperative diagnoses as listed above.  Please refer to preoperative notes for more details. Patient was counseled regarding need for laparoscopic salpingectomy. Risks of surgery including bleeding which may require transfusion or reoperation, infection, injury to bowel or other surrounding organs, need for additional procedures including laparotomy and other postoperative/anesthesia complications were explained to patient.  Written informed consent was obtained.  FINDINGS:  Moderate amount of hemoperitoneum estimated to be about 200 nl of blood and clots. However, left tube did not appear dilated but had some bleeding from the end.  Enlarged uterus noted, adherent to right adnexa and posterior cul-de-sac. Unable to visualize right ovary or posterior cul-de-sac.  Normal appearing left ovary.  ANESTHESIA: General INTRAVENOUS FLUIDS: 1500 ml ESTIMATED BLOOD LOSS: 20 ml SPECIMENS: Left fallopian tube possibly containing ectopic gestation COMPLICATIONS: None immediate  PROCEDURE IN DETAIL:  The patient was taken to the operating room where general anesthesia was administered and was found to be adequate.  She was placed in the dorsal lithotomy position, and was prepped and draped in a sterile manner.  A Foley catheter was inserted into her bladder and attached to constant drainage and a uterine manipulator was then advanced into the uterus .    After an adequate timeout was performed, attention was turned to the abdomen where an umbilical incision was made with the scalpel.  The  Optiview 11-mm trocar and sleeve were then advanced without difficulty with the laparoscope under direct visualization into the abdomen.  The abdomen was then insufflated with carbon dioxide gas and adequate pneumoperitoneum was obtained.  A survey of the patient's pelvis and abdomen revealed the findings above.  Two 5-mm right lower quadrant ports were then placed under direct visualization.  The Nezhat suction irrigator was then used to suction the hemoperitoneum and irrigate the pelvis.  Attention was then turned to the left fallopian tube which was grasped and ligated from the underlying mesosalpinx and uterine attachment using the Harmonic instrument.  Good hemostasis was noted.  The specimen was removed from the abdomen intact.  The abdomen was desufflated, and all instruments were removed.  The fascial incision of the 10-mm site was reapproximated with a 0 Vicryl figure-of-eight stitch; and all skin incisions were closed with 4-0 Monocryl.  The patient tolerated the procedure well.  All instruments, needles, and sponge counts were correct x 2. The patient was taken to the recovery room in stable condition.   The patient will be discharged to home as per PACU criteria.  Routine postoperative instructions given.  She was prescribed Percocet, Ibuprofen and Colace.  She will follow up in the clinic in about 2-3 weeks for postoperative evaluation.   Jaynie Collins, MD, FACOG Attending Obstetrician & Gynecologist Faculty Practice, [facility]

## 2016-05-20 NOTE — H&P (Signed)
Preoperative History and Physical  Robyn Barker is a 41 y.o. [id number] here for surgical management of presumed ectopic pregnancy given persistent abdominal pain despite recent methotrexate therapy.  Please see  MAU note for further details.   No significant preoperative concerns.  Proposed surgery: Laparoscopic unilateral salpingectomy and removal of ectopic pregnancy.  Past Medical History:  Diagnosis Date  . High cholesterol    Past Surgical History:  Procedure Laterality Date  . DIAGNOSTIC LAPAROSCOPY WITH REMOVAL OF ECTOPIC PREGNANCY N/A 05/15/2015   Procedure: DIAGNOSTIC LAPAROSCOPY WITH REMOVAL OF ECTOPIC PREGNANCY;  Surgeon: Reva Bores, MD;  Location: [facility];  Service: Gynecology;  Laterality: N/A;  . ovarian cyst removed    . SALPINGECTOMY Right 05/15/2015   OB History  Gravida Para Term Preterm AB Living  5 3 3   1 3   SAB TAB Ectopic Multiple Live Births      1        # Outcome Date GA Lbr Len/2nd Weight Sex Delivery Anes PTL Lv  5 Current           4 Ectopic 2017          3 Term      Vag-Spont     2 Term      Vag-Spont     1 Term      Vag-Spont       Patient denies any other pertinent gynecologic issues.   No current facility-administered medications on file prior to encounter.    Current Outpatient Prescriptions on File Prior to Encounter  Medication Sig Dispense Refill  . Cholecalciferol (VITAMIN D3) 5000 UNITS CAPS Take 5,000 Units by mouth daily.    Marland Kitchen PROAIR HFA 108 (90 Base) MCG/ACT inhaler INHALE 2 PUFFS EVERY FOUR TO SIX HOURS AS NEEDED  5  . simvastatin (ZOCOR) 20 MG tablet Take 20 mg by mouth daily.    . butalbital-acetaminophen-caffeine (FIORICET, ESGIC) 50-325-40 MG tablet Take 1-2 tablets by mouth every 6 (six) hours as needed for headache. (Patient not taking: Reported on 05/13/2016) 20 tablet 0   No Known Allergies  Social History:   reports that she has been smoking Cigarettes.  She has a 15.00 pack-year smoking history. She has never used  smokeless tobacco. She reports that she drinks alcohol. She reports that she uses drugs, including Marijuana.  Family History  Problem Relation Age of Onset  . Breast cancer Maternal Aunt     Review of Systems: Noncontributory  PHYSICAL EXAM: Blood pressure 123/71, pulse (!) 102, temperature 98.3 F (36.8 C), resp. rate 20, height 5' 1.5" (1.562 m), weight 200 lb (90.7 kg), last menstrual period 04/07/2016, SpO2 100 %. CONSTITUTIONAL: Well-developed, well-nourished female in no acute distress.  HENT:  Normocephalic, atraumatic, External right and left ear normal. Oropharynx is clear and moist EYES: Conjunctivae and EOM are normal. Pupils are equal, round, and reactive to light. No scleral icterus.  NECK: Normal range of motion, supple, no masses SKIN: Skin is warm and dry. No rash noted. Not diaphoretic. No erythema. No pallor. NEUROLOGIC: Alert and oriented to person, place, and time. Normal reflexes, muscle tone coordination. No cranial nerve deficit noted. PSYCHIATRIC: Normal mood and affect. Normal behavior. Normal judgment and thought content. CARDIOVASCULAR: Normal heart rate noted, regular rhythm RESPIRATORY: Effort and breath sounds normal, no problems with respiration noted ABDOMEN: Soft, nondistended, moderate LLQ tenderness PELVIC: Deferred MUSCULOSKELETAL: Normal range of motion. No edema and no tenderness. 2+ distal pulses.  Labs: Results for orders placed or  performed during the hospital encounter of 05/20/16 (from the past 336 hour(s))  Urinalysis, Routine w reflex microscopic   Collection Time: 05/20/16  7:00 AM  Result Value Ref Range   Color, Urine YELLOW YELLOW   APPearance HAZY (A) CLEAR   Specific Gravity, Urine 1.026 1.005 - 1.030   pH 5.0 5.0 - 8.0   Glucose, UA NEGATIVE NEGATIVE mg/dL   Hgb urine dipstick MODERATE (A) NEGATIVE   Bilirubin Urine NEGATIVE NEGATIVE   Ketones, ur NEGATIVE NEGATIVE mg/dL   Protein, ur NEGATIVE NEGATIVE mg/dL   Nitrite  NEGATIVE NEGATIVE   Leukocytes, UA NEGATIVE NEGATIVE   RBC / HPF 0-5 0 - 5 RBC/hpf   WBC, UA 0-5 0 - 5 WBC/hpf   Bacteria, UA NONE SEEN NONE SEEN   Squamous Epithelial / LPF 6-30 (A) NONE SEEN   Mucous PRESENT   hCG, quantitative, pregnancy   Collection Time: 05/20/16  7:05 AM  Result Value Ref Range   hCG, Beta Chain, Quant, S 285 (H) <5 mIU/mL  CBC with Differential/Platelet   Collection Time: 05/20/16  7:05 AM  Result Value Ref Range   WBC 17.1 (H) 4.0 - 10.5 K/uL   RBC 4.56 3.87 - 5.11 MIL/uL   Hemoglobin 13.9 12.0 - 15.0 g/dL   HCT 40.9 81.1 - 91.4 %   MCV 90.1 78.0 - 100.0 fL   MCH 30.5 26.0 - 34.0 pg   MCHC 33.8 30.0 - 36.0 g/dL   RDW 78.2 95.6 - 21.3 %   Platelets 346 150 - 400 K/uL   Neutrophils Relative % 76 %   Neutro Abs 12.9 (H) 1.7 - 7.7 K/uL   Lymphocytes Relative 17 %   Lymphs Abs 3.0 0.7 - 4.0 K/uL   Monocytes Relative 4 %   Monocytes Absolute 0.7 0.1 - 1.0 K/uL   Eosinophils Relative 3 %   Eosinophils Absolute 0.5 0.0 - 0.7 K/uL   Basophils Relative 0 %   Basophils Absolute 0.0 0.0 - 0.1 K/uL  Results for orders placed or performed during the hospital encounter of 05/18/16 (from the past 336 hour(s))  CBC   Collection Time: 05/18/16 11:11 PM  Result Value Ref Range   WBC 13.5 (H) 4.0 - 10.5 K/uL   RBC 4.38 3.87 - 5.11 MIL/uL   Hemoglobin 13.1 12.0 - 15.0 g/dL   HCT 08.6 57.8 - 46.9 %   MCV 89.3 78.0 - 100.0 fL   MCH 29.9 26.0 - 34.0 pg   MCHC 33.5 30.0 - 36.0 g/dL   RDW 62.9 52.8 - 41.3 %   Platelets 330 150 - 400 K/uL  Comprehensive metabolic panel   Collection Time: 05/18/16 11:11 PM  Result Value Ref Range   Sodium 134 (L) 135 - 145 mmol/L   Potassium 3.5 3.5 - 5.1 mmol/L   Chloride 106 101 - 111 mmol/L   CO2 22 22 - 32 mmol/L   Glucose, Bld 107 (H) 65 - 99 mg/dL   BUN 9 6 - 20 mg/dL   Creatinine, Ser 2.44 0.44 - 1.00 mg/dL   Calcium 8.6 (L) 8.9 - 10.3 mg/dL   Total Protein 7.4 6.5 - 8.1 g/dL   Albumin 4.0 3.5 - 5.0 g/dL   AST 16 15 - 41  U/L   ALT 16 14 - 54 U/L   Alkaline Phosphatase 42 38 - 126 U/L   Total Bilirubin 0.4 0.3 - 1.2 mg/dL   GFR calc non Af Amer >60 >60 mL/min   GFR calc Af  Amer >60 >60 mL/min   Anion gap 6 5 - 15  Results for orders placed or performed during the hospital encounter of 05/18/16 (from the past 336 hour(s))  Urinalysis, Routine w reflex microscopic   Collection Time: 05/18/16 10:50 AM  Result Value Ref Range   Color, Urine YELLOW YELLOW   APPearance CLEAR CLEAR   Specific Gravity, Urine 1.020 1.005 - 1.030   pH 5.0 5.0 - 8.0   Glucose, UA NEGATIVE NEGATIVE mg/dL   Hgb urine dipstick MODERATE (A) NEGATIVE   Bilirubin Urine NEGATIVE NEGATIVE   Ketones, ur NEGATIVE NEGATIVE mg/dL   Protein, ur NEGATIVE NEGATIVE mg/dL   Nitrite NEGATIVE NEGATIVE   Leukocytes, UA TRACE (A) NEGATIVE   RBC / HPF 0-5 0 - 5 RBC/hpf   WBC, UA 0-5 0 - 5 WBC/hpf   Bacteria, UA NONE SEEN NONE SEEN   Squamous Epithelial / LPF 0-5 (A) NONE SEEN   Mucous PRESENT   hCG, quantitative, pregnancy   Collection Time: 05/18/16 12:08 PM  Result Value Ref Range   hCG, Beta Chain, Quant, S 329 (H) <5 mIU/mL  Results for orders placed or performed during the hospital encounter of 05/13/16 (from the past 336 hour(s))  CBC with Differential   Collection Time: 05/13/16 10:05 AM  Result Value Ref Range   WBC 11.7 (H) 3.6 - 11.0 K/uL   RBC 4.51 3.80 - 5.20 MIL/uL   Hemoglobin 13.5 12.0 - 16.0 g/dL   HCT 16.1 09.6 - 04.5 %   MCV 89.4 80.0 - 100.0 fL   MCH 29.9 26.0 - 34.0 pg   MCHC 33.5 32.0 - 36.0 g/dL   RDW 40.9 81.1 - 91.4 %   Platelets 276 150 - 440 K/uL   Neutrophils Relative % 67 %   Neutro Abs 8.0 (H) 1.4 - 6.5 K/uL   Lymphocytes Relative 21 %   Lymphs Abs 2.5 1.0 - 3.6 K/uL   Monocytes Relative 7 %   Monocytes Absolute 0.8 0.2 - 0.9 K/uL   Eosinophils Relative 4 %   Eosinophils Absolute 0.4 0 - 0.7 K/uL   Basophils Relative 1 %   Basophils Absolute 0.1 0 - 0.1 K/uL  hCG, quantitative, pregnancy   Collection  Time: 05/13/16 10:05 AM  Result Value Ref Range   hCG, Beta Chain, Quant, S 119 (H) <5 mIU/mL  Comprehensive metabolic panel   Collection Time: 05/13/16 10:05 AM  Result Value Ref Range   Sodium 137 135 - 145 mmol/L   Potassium 3.8 3.5 - 5.1 mmol/L   Chloride 107 101 - 111 mmol/L   CO2 23 22 - 32 mmol/L   Glucose, Bld 111 (H) 65 - 99 mg/dL   BUN 10 6 - 20 mg/dL   Creatinine, Ser 7.82 0.44 - 1.00 mg/dL   Calcium 8.9 8.9 - 95.6 mg/dL   Total Protein 7.3 6.5 - 8.1 g/dL   Albumin 3.8 3.5 - 5.0 g/dL   AST 20 15 - 41 U/L   ALT 15 14 - 54 U/L   Alkaline Phosphatase 44 38 - 126 U/L   Total Bilirubin 0.4 0.3 - 1.2 mg/dL   GFR calc non Af Amer >60 >60 mL/min   GFR calc Af Amer >60 >60 mL/min   Anion gap 7 5 - 15  Results for orders placed or performed during the hospital encounter of 05/09/16 (from the past 336 hour(s))  hCG, quantitative, pregnancy   Collection Time: 05/09/16 11:37 PM  Result Value Ref Range  hCG, Beta Chain, Quant, S 40 (H) <5 mIU/mL  CBC   Collection Time: 05/09/16 11:37 PM  Result Value Ref Range   WBC 14.2 (H) 3.6 - 11.0 K/uL   RBC 4.43 3.80 - 5.20 MIL/uL   Hemoglobin 13.2 12.0 - 16.0 g/dL   HCT 16.1 09.6 - 04.5 %   MCV 88.7 80.0 - 100.0 fL   MCH 29.9 26.0 - 34.0 pg   MCHC 33.7 32.0 - 36.0 g/dL   RDW 40.9 81.1 - 91.4 %   Platelets 263 150 - 440 K/uL  Comprehensive metabolic panel   Collection Time: 05/09/16 11:37 PM  Result Value Ref Range   Sodium 135 135 - 145 mmol/L   Potassium 3.5 3.5 - 5.1 mmol/L   Chloride 106 101 - 111 mmol/L   CO2 23 22 - 32 mmol/L   Glucose, Bld 152 (H) 65 - 99 mg/dL   BUN 11 6 - 20 mg/dL   Creatinine, Ser 7.82 0.44 - 1.00 mg/dL   Calcium 9.4 8.9 - 95.6 mg/dL   Total Protein 7.0 6.5 - 8.1 g/dL   Albumin 3.8 3.5 - 5.0 g/dL   AST 23 15 - 41 U/L   ALT 16 14 - 54 U/L   Alkaline Phosphatase 42 38 - 126 U/L   Total Bilirubin 0.4 0.3 - 1.2 mg/dL   GFR calc non Af Amer >60 >60 mL/min   GFR calc Af Amer >60 >60 mL/min   Anion  gap 6 5 - 15  Urinalysis, Complete w Microscopic   Collection Time: 05/09/16 11:38 PM  Result Value Ref Range   Color, Urine YELLOW (A) YELLOW   APPearance CLEAR (A) CLEAR   Specific Gravity, Urine 1.023 1.005 - 1.030   pH 5.0 5.0 - 8.0   Glucose, UA 50 (A) NEGATIVE mg/dL   Hgb urine dipstick MODERATE (A) NEGATIVE   Bilirubin Urine NEGATIVE NEGATIVE   Ketones, ur NEGATIVE NEGATIVE mg/dL   Protein, ur NEGATIVE NEGATIVE mg/dL   Nitrite NEGATIVE NEGATIVE   Leukocytes, UA NEGATIVE NEGATIVE   RBC / HPF 0-5 0 - 5 RBC/hpf   WBC, UA 0-5 0 - 5 WBC/hpf   Bacteria, UA NONE SEEN NONE SEEN   Squamous Epithelial / LPF 0-5 (A) NONE SEEN  Pregnancy, urine   Collection Time: 05/09/16 11:38 PM  Result Value Ref Range   Preg Test, Ur POSITIVE (A) NEGATIVE    Imaging Studies: US Ob Comp Less 14 Wks  Result Date: 05/10/2016 CLINICAL DATA:  1 -year-old female with positive urine pregnancy test presenting with pelvic pain. History of ectopic pregnancy with left salpingectomy. LMP: 04/11/2016 corresponding to an estimated gestational age of [redacted] weeks, 1 day EXAM: OBSTETRIC <14 WK ULTRASOUND TECHNIQUE: Transabdominal ultrasound was performed for evaluation of the gestation as well as the maternal uterus and adnexal regions. COMPARISON:  None for this pregnancy FINDINGS: The uterus is anteverted and heterogeneous. There are 2 intramural fibroids with the largest measuring 2.6 x 2.4 x 2.4 cm. The endometrium is thickened measuring 17 mm. No intrauterine pregnancy identified. No abnormal endometrial vascularity noted. Findings may represent an early pregnancy, recent spontaneous abortion, or an ectopic pregnancy. Correlation with clinical exam and follow-up with serial HCG levels and ultrasound recommended. The left ovary measures 4.4 x 2.1 x 2.1 cm for a volume of 10 cc. There is slight prominence of the left ovary on 1 image (41/69). No discrete mass identified. However if there is clinical concern for ectopic  pregnancy in the region  of the left ovary further evaluation with repeat ultrasound and compression in the region of the left ovary by ultrasound probe recommended to exclude the possibility of a para ovarian mass/ectopic. The right ovary measures 4.1 x 2.0 x 2.8 cm for a volume of 12 cc. No free fluid noted within the pelvis. IMPRESSION: 1. No intrauterine pregnancy identified. Differentials include early pregnancy, recent spontaneous abortion, or possible ectopic pregnancy. Correlation with clinical exam and follow-up with serial HCG levels and ultrasound recommended. 2. Somewhat prominent appearance of the left ovary seen on 1 image. If there is clinical concern for ectopic pregnancy in the region of the left ovary further evaluation of the left ovary with ultrasound and compression technique is recommended. 3. No free fluid within the pelvis. 4. Small uterine fibroids. Electronically Signed   By: Elgie Collard M.D.   On: 05/10/2016 02:12   US Ob Transvaginal  Addendum Date: 05/20/2016   ADDENDUM REPORT: 05/20/2016 09:04 ADDENDUM: Case was re-reviewed following discussion with Vonzella Nipple. On the technologist's worksheet, it is stated the patient had a left oophorectomy. The patient has not had a left oophorectomy. Patient has had a right salpingectomy. No simple or hemorrhagic pelvic fluid is identified. Electronically Signed   By: Elige Ko   On: 05/20/2016 09:04   Result Date: 05/20/2016 CLINICAL DATA:  Six weeks of pain.  Left oophorectomy. EXAM: OBSTETRIC <14 WK ULTRASOUND TECHNIQUE: Transabdominal ultrasound was performed for evaluation of the gestation as well as the maternal uterus and adnexal regions. COMPARISON:  05/18/2016 FINDINGS: Intrauterine gestational sac: None Yolk sac:  Not Visualized. Embryo:  Not Visualized. Cardiac Activity: Not Visualized. Subchorionic hemorrhage:  None visualized. Maternal uterus/adnexae: 14 mm posterior uterine hypoechoic mass consistent with a fibroid.  Bilateral ovaries are normal in size. Right ovary measures 6 x 2.4 x 2.9 cm. Left ovary measures 3 x 1.7 x 2.4 cm. Left adnexal 2.7 x 2.8 cm hyperechoic avascular mass which may be adjacent to versus within the left ovary. IMPRESSION: 1. No intrauterine pregnancy identified. Given the down trending beta HCG, this likely reflects a missed abortion. Recommend clinical correlation, serial quantitative beta HCGs, and followup ultrasound as clinically indicated. 2. Left adnexal 2.7 x 2.8 cm hyperechoic avascular mass which may be adjacent to versus within the left ovary. This may reflect a hemorrhagic corpus luteum cyst versus a hematoma given the patient has had left oophorectomy 6 weeks ago. Electronically Signed: By: Elige Ko On: 05/20/2016 08:56   US Ob Transvaginal  Result Date: 05/18/2016 CLINICAL DATA:  Abdominal pain since this morning, prior ectopic pregnancy and LEFT salpingectomy last year ; quantitative beta HCG = 329 EXAM: TRANSVAGINAL OB ULTRASOUND TECHNIQUE: Transvaginal ultrasound was performed for complete evaluation of the gestation as well as the maternal uterus, adnexal regions, and pelvic cul-de-sac. COMPARISON:  05/10/2016 FINDINGS: Intrauterine gestational sac: Not visualized Yolk sac:  N/A Embryo:  N/A Cardiac Activity: N/A Heart Rate: N/A bpm Subchorionic hemorrhage:  N/A Maternal uterus/adnexae: Intramural uterine leiomyoma anterior upper to mid uterine segment, 2.1 x 3.1 x 2.8 cm. Small intramural leiomyoma 1.5 x 1.1 x 2.0 cm posterior mid uterus. Endometrial complex 21 mm thick without gestational sac or endometrial fluid. RIGHT ovary normal size and morphology 5.5 x 2.2 x 2.6 cm. LEFT ovary measures 3.6 x 4.0 x 2.5 cm and contains a hyperechoic nodule 2.1 x 1.0 x 1.9 cm question hemorrhagic corpus luteum. Blood flow seen within both ovaries on color Doppler imaging. Small amount of free pelvic fluid. No  adnexal masses. IMPRESSION: No intrauterine gestation identified. Findings are  compatible with pregnancy of unknown location. Differential diagnosis includes early intrauterine pregnancy too early to visualize, spontaneous abortion, and ectopic pregnancy. Serial quantitative beta HCG and or followup ultrasound recommended to definitively exclude ectopic pregnancy. Small uterine fibroids. Electronically Signed   By: Ulyses Southward M.D.   On: 05/18/2016 14:15   US Ob Transvaginal  Result Date: 05/10/2016 CLINICAL DATA:  37 -year-old female with positive urine pregnancy test presenting with pelvic pain. History of ectopic pregnancy with left salpingectomy. LMP: 04/11/2016 corresponding to an estimated gestational age of [redacted] weeks, 1 day EXAM: OBSTETRIC <14 WK ULTRASOUND TECHNIQUE: Transabdominal ultrasound was performed for evaluation of the gestation as well as the maternal uterus and adnexal regions. COMPARISON:  None for this pregnancy FINDINGS: The uterus is anteverted and heterogeneous. There are 2 intramural fibroids with the largest measuring 2.6 x 2.4 x 2.4 cm. The endometrium is thickened measuring 17 mm. No intrauterine pregnancy identified. No abnormal endometrial vascularity noted. Findings may represent an early pregnancy, recent spontaneous abortion, or an ectopic pregnancy. Correlation with clinical exam and follow-up with serial HCG levels and ultrasound recommended. The left ovary measures 4.4 x 2.1 x 2.1 cm for a volume of 10 cc. There is slight prominence of the left ovary on 1 image (41/69). No discrete mass identified. However if there is clinical concern for ectopic pregnancy in the region of the left ovary further evaluation with repeat ultrasound and compression in the region of the left ovary by ultrasound probe recommended to exclude the possibility of a para ovarian mass/ectopic. The right ovary measures 4.1 x 2.0 x 2.8 cm for a volume of 12 cc. No free fluid noted within the pelvis. IMPRESSION: 1. No intrauterine pregnancy identified. Differentials include early  pregnancy, recent spontaneous abortion, or possible ectopic pregnancy. Correlation with clinical exam and follow-up with serial HCG levels and ultrasound recommended. 2. Somewhat prominent appearance of the left ovary seen on 1 image. If there is clinical concern for ectopic pregnancy in the region of the left ovary further evaluation of the left ovary with ultrasound and compression technique is recommended. 3. No free fluid within the pelvis. 4. Small uterine fibroids. Electronically Signed   By: Elgie Collard M.D.   On: 05/10/2016 02:12    Assessment: Patient Active Problem List   Diagnosis Date Noted  . Ectopic pregnancy 05/20/2016    Plan: Patient was counseled regarding need for operative laparoscopy and salpingectomy. The risks of surgery were discussed in detail with the patient including but not limited to: bleeding which may require transfusion or reoperation; infection which may require antibiotics; injury to surrounding organs which may involve bowel, bladder; need for additional procedures including laparotomy; thromboembolic phenomenon, surgical site problems and other postoperative/anesthesia complications. Likelihood of success in alleviating the patient's condition was discussed. Routine postoperative instructions will be reviewed with the patient and her family in detail after surgery.  The patient concurred with the proposed plan, giving informed written consent for the surgery.  Patient has been NPO since last night and she will remain NPO for procedure.  Anesthesia and OR aware.  Preoperative prophylactic antibiotics and SCDs ordered on call to the OR.  To OR when ready.  Jaynie Collins, M.D. 05/20/2016 11:27 AM

## 2016-05-20 NOTE — Anesthesia Preprocedure Evaluation (Signed)
Anesthesia Evaluation  Patient identified by MRN, date of birth, ID band Patient awake    Reviewed: Allergy & Precautions, NPO status , Patient's Chart, lab work & pertinent test results  Airway Mallampati: III       Dental no notable dental hx. (+) Teeth Intact   Pulmonary Current Smoker,    Pulmonary exam normal breath sounds clear to auscultation       Cardiovascular negative cardio ROS Normal cardiovascular exam Rhythm:Regular Rate:Normal     Neuro/Psych negative neurological ROS  negative psych ROS   GI/Hepatic negative GI ROS, Neg liver ROS,   Endo/Other  Hypercholesterolemia  Renal/GU negative Renal ROS  negative genitourinary   Musculoskeletal negative musculoskeletal ROS (+)   Abdominal (+) + obese,   Peds  Hematology negative hematology ROS (+)   Anesthesia Other Findings   Reproductive/Obstetrics (+) Pregnancy Ectopic pregnancy                             Anesthesia Physical Anesthesia Plan  ASA: II and emergent  Anesthesia Plan: General   Post-op Pain Management:    Induction: Intravenous, Rapid sequence and Cricoid pressure planned  Airway Management Planned: Oral ETT  Additional Equipment:   Intra-op Plan:   Post-operative Plan: Extubation in OR  Informed Consent: I have reviewed the patients History and Physical, chart, labs and discussed the procedure including the risks, benefits and alternatives for the proposed anesthesia with the patient or authorized representative who has indicated his/her understanding and acceptance.   Dental advisory given  Plan Discussed with: CRNA, Anesthesiologist and Surgeon  Anesthesia Plan Comments:         Anesthesia Quick Evaluation

## 2016-05-20 NOTE — MAU Provider Note (Signed)
History     CSN: [id number]  Arrival date and time: 05/20/16 0630   First Provider Initiated Contact with Patient 05/20/16 0720      Chief Complaint  Patient presents with  . Abdominal Pain   HPI [name] is a 41 y.o. [phone] at [redacted]w[redacted]d who presents to MAU today with complaint of continued abdominal pain. The patient rates her pain at 7/10 now. She has been taking Ibuprofen and Tylenol without relief. She had MTX on 05/18/16 for presumed ectopic pregnancy given inappropriate rise in quant hCG and no IUP on [place]. She denies vaginal bleeding or fever. She has had a ectopic pregnancy in the past requiring salpingectomy.   OB History    Gravida Para Term Preterm AB Living   SAB TAB Ectopic Multiple Live Births       1          Past Medical History:  Diagnosis Date  . High cholesterol     Past Surgical History:  Procedure Laterality Date  . DIAGNOSTIC LAPAROSCOPY WITH REMOVAL OF ECTOPIC PREGNANCY N/A 05/15/2015   Procedure: DIAGNOSTIC LAPAROSCOPY WITH REMOVAL OF ECTOPIC PREGNANCY;  Surgeon: Reva Bores, MD;  Location: [facility];  Service: Gynecology;  Laterality: N/A;  . ovarian cyst removed    . SALPINGECTOMY Left     Family History  Problem Relation Age of Onset  . Breast cancer Maternal Aunt     Social History  Substance Use Topics  . Smoking status: Current Every Day Smoker    Packs/day: 1.00    Years: 15.00    Types: Cigarettes  . Smokeless tobacco: Never Used  . Alcohol use Yes     Comment: occasionally    Allergies: No Known Allergies  Prescriptions Prior to Admission  Medication Sig Dispense Refill Last Dose  . butalbital-acetaminophen-caffeine (FIORICET, ESGIC) 50-325-40 MG tablet Take 1-2 tablets by mouth every 6 (six) hours as needed for headache. (Patient not taking: Reported on 05/13/2016) 20 tablet 0 Not Taking at Unknown time  . Cholecalciferol (VITAMIN D3) 5000 UNITS CAPS Take 5,000 Units by mouth daily.   Past Month at Unknown  time  . PROAIR HFA 108 (90 Base) MCG/ACT inhaler INHALE 2 PUFFS EVERY FOUR TO SIX HOURS AS NEEDED  5 Past Month at Unknown time  . simvastatin (ZOCOR) 20 MG tablet Take 20 mg by mouth daily.   Past Month at Unknown time    Review of Systems  Constitutional: Negative for fever.  Gastrointestinal: Positive for abdominal pain. Negative for constipation, diarrhea, nausea and vomiting.  Genitourinary: Negative for dysuria, frequency, urgency, vaginal bleeding and vaginal discharge.   Physical Exam   Blood pressure 123/71, pulse (!) 102, temperature 98.3 F (36.8 C), resp. rate 20, height 5' 1.5" (1.562 m), weight 200 lb (90.7 kg), last menstrual period 04/07/2016, SpO2 100 %.  Physical Exam  Nursing note and vitals reviewed. Constitutional: She is oriented to person, place, and time. She appears well-developed and well-nourished. No distress.  HENT:  Head: Normocephalic and atraumatic.  Cardiovascular: Normal rate.   Respiratory: Effort normal.  GI: Soft. She exhibits no distension and no mass. There is tenderness (mild to moderate tenderness to palpation of the lower abdomen). There is no rebound and no guarding.  Neurological: She is alert and oriented to person, place, and time.  Skin: Skin is warm and dry. No erythema.  Psychiatric: She has a normal mood and affect.  Results for orders placed or performed during the hospital encounter of 05/20/16 (from the past 24 hour(s))  Urinalysis, Routine w reflex microscopic     Status: Abnormal   Collection Time: 05/20/16  7:00 AM  Result Value Ref Range   Color, Urine YELLOW YELLOW   APPearance HAZY (A) CLEAR   Specific Gravity, Urine 1.026 1.005 - 1.030   pH 5.0 5.0 - 8.0   Glucose, UA NEGATIVE NEGATIVE mg/dL   Hgb urine dipstick MODERATE (A) NEGATIVE   Bilirubin Urine NEGATIVE NEGATIVE   Ketones, ur NEGATIVE NEGATIVE mg/dL   Protein, ur NEGATIVE NEGATIVE mg/dL   Nitrite NEGATIVE NEGATIVE   Leukocytes, UA NEGATIVE NEGATIVE   RBC /  HPF 0-5 0 - 5 RBC/hpf   WBC, UA 0-5 0 - 5 WBC/hpf   Bacteria, UA NONE SEEN NONE SEEN   Squamous Epithelial / LPF 6-30 (A) NONE SEEN   Mucous PRESENT   hCG, quantitative, pregnancy     Status: Abnormal   Collection Time: 05/20/16  7:05 AM  Result Value Ref Range   hCG, Beta Chain, Quant, S 285 (H) <5 mIU/mL  CBC with Differential/Platelet     Status: Abnormal   Collection Time: 05/20/16  7:05 AM  Result Value Ref Range   WBC 17.1 (H) 4.0 - 10.5 K/uL   RBC 4.56 3.87 - 5.11 MIL/uL   Hemoglobin 13.9 12.0 - 15.0 g/dL   HCT 16.1 09.6 - 04.5 %   MCV 90.1 78.0 - 100.0 fL   MCH 30.5 26.0 - 34.0 pg   MCHC 33.8 30.0 - 36.0 g/dL   RDW 40.9 81.1 - 91.4 %   Platelets 346 150 - 400 K/uL   Neutrophils Relative % 76 %   Neutro Abs 12.9 (H) 1.7 - 7.7 K/uL   Lymphocytes Relative 17 %   Lymphs Abs 3.0 0.7 - 4.0 K/uL   Monocytes Relative 4 %   Monocytes Absolute 0.7 0.1 - 1.0 K/uL   Eosinophils Relative 3 %   Eosinophils Absolute 0.5 0.0 - 0.7 K/uL   Basophils Relative 0 %   Basophils Absolute 0.0 0.0 - 0.1 K/uL   US Ob Transvaginal  Addendum Date: 05/20/2016   ADDENDUM REPORT: 05/20/2016 09:04 ADDENDUM: Case was re-reviewed following discussion with Vonzella Nipple. On the technologist's worksheet, it is stated the patient had a left oophorectomy. The patient has not had a left oophorectomy. Patient has had a right salpingectomy. No simple or hemorrhagic pelvic fluid is identified. Electronically Signed   By: Elige Ko   On: 05/20/2016 09:04   Result Date: 05/20/2016 CLINICAL DATA:  Six weeks of pain.  Left oophorectomy. EXAM: OBSTETRIC <14 WK ULTRASOUND TECHNIQUE: Transabdominal ultrasound was performed for evaluation of the gestation as well as the maternal uterus and adnexal regions. COMPARISON:  05/18/2016 FINDINGS: Intrauterine gestational sac: None Yolk sac:  Not Visualized. Embryo:  Not Visualized. Cardiac Activity: Not Visualized. Subchorionic hemorrhage:  None visualized. Maternal  uterus/adnexae: 14 mm posterior uterine hypoechoic mass consistent with a fibroid. Bilateral ovaries are normal in size. Right ovary measures 6 x 2.4 x 2.9 cm. Left ovary measures 3 x 1.7 x 2.4 cm. Left adnexal 2.7 x 2.8 cm hyperechoic avascular mass which may be adjacent to versus within the left ovary. IMPRESSION: 1. No intrauterine pregnancy identified. Given the down trending beta HCG, this likely reflects a missed abortion. Recommend clinical correlation, serial quantitative beta HCGs, and followup ultrasound as clinically indicated. 2. Left adnexal 2.7 x 2.8 cm hyperechoic avascular mass  which may be adjacent to versus within the left ovary. This may reflect a hemorrhagic corpus luteum cyst versus a hematoma given the patient has had left oophorectomy 6 weeks ago. Electronically Signed: By: Elige Ko On: 05/20/2016 08:56     MAU Course  Procedures None  MDM UA,CBC, hCG and [place] today  0800 - Labs pending. Patient waiting for [place]. Care turned over the Judeth Horn, NP  Marny Lowenstein, PA-C  05/20/2016, 7:54 AM    Dilaudid 1 mg IM Ultrasound shows 2.7 cm left adnexal mass; no free fluid S/w Dr. Macon Large. Will come see patient to discuss plan of care.  Assessment and Plan  Attestation of Attending Supervision of Advanced Practice Provider (PA/CNM/NP): Evaluation and management procedures were performed by the Advanced Practice Provider under my supervision and collaboration.  I have reviewed the Advanced Practice Provider's note and chart, and I agree with the management and plan.   41 y.o. [id number] with history of ectopic pregnancy, now undergoing treatment for another ectopic pregnancy complicated by severe abdominal pain.   On exam, she had stable vital signs, and moderate lower abdominal tenderness L>R . Patient was counseled regarding need for operative laparoscopy and salpingectomy. Risks of surgery including bleeding which may require transfusion or reoperation, infection, injury to  bowel or other surrounding organs, need for additional procedures including laparotomy were explained to patient. Also explained that this procedure will render the patient infertile given history of previous salpingectomy.  [place] informed consent was obtained.  Patient has been NPO since 2130 last night and she will remain NPO for procedure. Anesthesia and OR aware.  Preoperative prophylactic antibiotics and SCDs ordered on call to the OR.  To OR when ready.    Jaynie Collins, MD, FACOG Attending Obstetrician & Gynecologist Faculty Practice, [facility]

## 2016-05-20 NOTE — Anesthesia Postprocedure Evaluation (Signed)
Anesthesia Post Note  Patient: Robyn Barker  Procedure(s) Performed: Procedure(s) (LRB): DIAGNOSTIC LAPAROSCOPY (Bilateral) UNILATERAL SALPINGECTOMY (Left)  Patient location during evaluation: PACU Anesthesia Type: General Level of consciousness: awake and alert and oriented Pain management: pain level controlled Vital Signs Assessment: post-procedure vital signs reviewed and stable Respiratory status: spontaneous breathing, nonlabored ventilation and respiratory function stable Cardiovascular status: blood pressure returned to baseline and stable Postop Assessment: no signs of nausea or vomiting Anesthetic complications: no        Last Vitals:  Vitals:   05/20/16 1430 05/20/16 1500  BP: 120/74 139/64  Pulse: 86 87  Resp: (!) 24 15  Temp:  36.4 C    Last Pain:  Vitals:   05/20/16 1500  PainSc: 2    Pain Goal: Patients Stated Pain Goal: 0 (05/20/16 1000)               Shahrukh Pasch A.

## 2016-05-22 ENCOUNTER — Ambulatory Visit: Payer: Self-pay

## 2016-05-22 ENCOUNTER — Encounter (HOSPITAL_COMMUNITY): Payer: Self-pay | Admitting: Obstetrics & Gynecology

## 2016-05-22 ENCOUNTER — Telehealth: Payer: Self-pay | Admitting: General Practice

## 2016-05-22 NOTE — Telephone Encounter (Signed)
Patient no showed to bhcg appt. Called patient and she states she didn't need the lab work today because she went back to the hospital and ended up having surgery. Patient had no questions

## 2016-05-24 ENCOUNTER — Telehealth: Payer: Self-pay | Admitting: *Deleted

## 2016-05-24 ENCOUNTER — Other Ambulatory Visit (INDEPENDENT_AMBULATORY_CARE_PROVIDER_SITE_OTHER): Payer: Medicaid Other | Admitting: *Deleted

## 2016-05-24 DIAGNOSIS — O009 Unspecified ectopic pregnancy without intrauterine pregnancy: Secondary | ICD-10-CM | POA: Diagnosis not present

## 2016-05-24 NOTE — Telephone Encounter (Signed)
Pt had Diagnostic Lap and Salpinectomy on Saturday 05-20-16.  Called today requesting a refill on the Oxycodone.  Was given a rx for 30 tablets on Saturday.  Informed pt that Dr Macon Large would not be able to refill that rx at this time and encouraged to take the Ibuprofen around the clock every 6 hours as prescribed.  Pt states she has been using the Ibuprofen as well and she is still experiencing a lot of pain and passing clots.  Informed pt by this point her pain should be getting better and she should go to [facility] for evaluation to rule out any complications. Pt acknowledged instructions.

## 2016-05-24 NOTE — Progress Notes (Signed)
Received call from Pathology regarding fallopian tube specimen submitted after surgery. No villi or other ectopic pregnancy was seen.  Patient was being treated for presumed ectopic with methotrexate and had hemoperitoneum; she may have had extrusion of the pregnancy into the hemoperitoneum or the pregnancy could have been in another location.  The HCG was going down; will repeat HCG ASAP (today will be Day 7 after methotrexate) to ensure that levels are going down as expected.  Message routed to RN at [facility] to contact patient with this recommendation.   Tereso Newcomer, MD

## 2016-05-24 NOTE — Progress Notes (Signed)
Pt here today for BHCG due to recent ectopic pregnancy with a negative pathology report.  Blood drawn and sent to lab.

## 2016-05-25 ENCOUNTER — Telehealth: Payer: Self-pay | Admitting: *Deleted

## 2016-05-25 LAB — BETA HCG QUANT (REF LAB): HCG QUANT: 17 m[IU]/mL

## 2016-05-25 NOTE — Telephone Encounter (Signed)
Informed pt of result and that we will re draw at her next visit on 05-31-16.

## 2016-05-25 NOTE — Telephone Encounter (Signed)
-----   Message from Tereso Newcomer, MD sent at 05/25/2016  7:58 AM EDT ----- Excellent HCG decrease.  Will redraw during appointment next visit (05/31/16), needs serial HCGs until <5.  Thank you.

## 2016-05-31 ENCOUNTER — Encounter: Payer: Self-pay | Admitting: Obstetrics & Gynecology

## 2016-06-06 ENCOUNTER — Encounter: Payer: Self-pay | Admitting: Obstetrics & Gynecology

## 2016-06-30 ENCOUNTER — Encounter (HOSPITAL_COMMUNITY): Payer: Self-pay | Admitting: Emergency Medicine

## 2016-06-30 ENCOUNTER — Emergency Department (HOSPITAL_COMMUNITY)
Admission: EM | Admit: 2016-06-30 | Discharge: 2016-06-30 | Disposition: A | Discharge status: Home / Self Care | Payer: Medicaid Other | Attending: Emergency Medicine | Admitting: Emergency Medicine

## 2016-06-30 DIAGNOSIS — M79652 Pain in left thigh: Secondary | ICD-10-CM

## 2016-06-30 DIAGNOSIS — F17210 Nicotine dependence, cigarettes, uncomplicated: Secondary | ICD-10-CM | POA: Insufficient documentation

## 2016-06-30 DIAGNOSIS — Z79899 Other long term (current) drug therapy: Secondary | ICD-10-CM | POA: Insufficient documentation

## 2016-06-30 DIAGNOSIS — M25552 Pain in left hip: Secondary | ICD-10-CM | POA: Insufficient documentation

## 2016-06-30 DIAGNOSIS — J45909 Unspecified asthma, uncomplicated: Secondary | ICD-10-CM | POA: Insufficient documentation

## 2016-06-30 HISTORY — DX: Unspecified asthma, uncomplicated: J45.909

## 2016-06-30 HISTORY — DX: Herpesviral infection of urogenital system, unspecified: A60.00

## 2016-06-30 MED ORDER — KETOROLAC TROMETHAMINE 30 MG/ML IJ SOLN
30.0000 mg | Freq: Once | INTRAMUSCULAR | Status: AC
  Administered 2016-06-30: 30 mg via INTRAMUSCULAR
  Filled 2016-06-30: qty 1

## 2016-06-30 MED ORDER — MELOXICAM 15 MG PO TABS: 15.0000 mg | ORAL_TABLET | Freq: Every day | ORAL | 0 refills | Status: DC

## 2016-06-30 NOTE — ED Triage Notes (Signed)
Pt. reports left upper thigh muscle pain for 3 weeks radiating to left buttocks denies injury / ambulatory .

## 2016-06-30 NOTE — ED Provider Notes (Signed)
[facility]-EMERGENCY DEPT Provider Note   CSN: [id number] Arrival date & time: 06/30/16  0300     History   Chief Complaint Chief Complaint  Patient presents with  . Leg Pain    HPI Robyn Barker is a 41 y.o. female with a PMHx of asthma and HLD, who presents to the ED with complaints of left hip pain 3 weeks. Patient states that the pain has waxed and waned over the last 3 weeks, reporting that the other day at work it was more severe than it is today. She initially thought that it was because she had been laying on the couch a lot lately, denies any injuries or trauma/falls. She describes her pain as currently 5/10 constant sharp left anterior thigh pain radiating to the left buttock/posterior hip, worse with laying down and walking, and unrelieved with Tylenol and ibuprofen. She denies any leg/joint swelling, rashes/skin changes, warmth/redness to the hip, fevers, chills, CP, SOB, abd pain, N/V/D/C, hematuria, dysuria, numbness, tingling, focal weakness, or any other complaints at this time.    The history is provided by the patient and medical records. No language interpreter was used.  Leg Pain   This is a new problem. The current episode started more than 1 week ago. The problem occurs constantly. The problem has not changed since onset.The pain is present in the left hip. The quality of the pain is described as sharp. The pain is at a severity of 5/10. The pain is mild. Pertinent negatives include no numbness and no tingling. The symptoms are aggravated by lying down and activity. She has tried OTC pain medications for the symptoms. The treatment provided no relief. There has been no history of extremity trauma.    Past Medical History:  Diagnosis Date  . Asthma   . Genital herpes   . High cholesterol     Patient Active Problem List   Diagnosis Date Noted  . Ectopic pregnancy 05/20/2016    Past Surgical History:  Procedure Laterality Date  . DIAGNOSTIC LAPAROSCOPY WITH  REMOVAL OF ECTOPIC PREGNANCY N/A 05/15/2015   Procedure: DIAGNOSTIC LAPAROSCOPY WITH REMOVAL OF ECTOPIC PREGNANCY;  Surgeon: Reva Boresanya S Pratt, MD;  Location: [facility];  Service: Gynecology;  Laterality: N/A;  . DIAGNOSTIC LAPAROSCOPY WITH REMOVAL OF ECTOPIC PREGNANCY Bilateral 05/20/2016   Procedure: DIAGNOSTIC LAPAROSCOPY;  Surgeon: Tereso NewcomerUgonna A Anyanwu, MD;  Location: [facility];  Service: Gynecology;  Laterality: Bilateral;  . ovarian cyst removed    . SALPINGECTOMY Right 05/15/2015  . UNILATERAL SALPINGECTOMY Left 05/20/2016   Procedure: UNILATERAL SALPINGECTOMY;  Surgeon: Tereso NewcomerUgonna A Anyanwu, MD;  Location: [facility];  Service: Gynecology;  Laterality: Left;    OB History    Gravida Para Term Preterm AB Living   5 3 3   1 3    SAB TAB Ectopic Multiple Live Births       1           Home Medications    Prior to Admission medications   Medication Sig Start Date End Date Taking? Authorizing Provider  Cholecalciferol (VITAMIN D3) 5000 UNITS CAPS Take 5,000 Units by mouth daily.    [provider]  docusate sodium (COLACE) 100 MG capsule Take 1 capsule (100 mg total) by mouth 2 (two) times daily as needed. 05/20/16   Anyanwu, Jethro BastosUgonna A, MD  fluticasone (FLONASE) 50 MCG/ACT nasal spray Place 2 sprays into both nostrils daily.    [provider]  ibuprofen (ADVIL,MOTRIN) 600 MG tablet Take 1 tablet (600 mg total)  by mouth every 6 (six) hours as needed. 05/20/16   Anyanwu, Jethro Bastos, MD  oxyCODONE-acetaminophen (PERCOCET/ROXICET) 5-325 MG tablet Take 1-2 tablets by mouth every 6 (six) hours as needed. 05/20/16   Anyanwu, Jethro Bastos, MD  PROAIR HFA 108 (90 Base) MCG/ACT inhaler INHALE 2 PUFFS EVERY FOUR TO SIX HOURS AS NEEDED 05/10/15   [provider]  simvastatin (ZOCOR) 20 MG tablet Take 20 mg by mouth daily.    [provider]    Family History Family History  Problem Relation Age of Onset  . Breast cancer Maternal Aunt     Social History Social History  Substance Use  Topics  . Smoking status: Current Every Day Smoker    Packs/day: 0.00    Years: 0.00    Types: Cigarettes  . Smokeless tobacco: Never Used  . Alcohol use Yes     Comment: occasionally     Allergies   Patient has no known allergies.   Review of Systems Review of Systems  Constitutional: Negative for chills and fever.  Respiratory: Negative for shortness of breath.   Cardiovascular: Negative for chest pain and leg swelling.  Gastrointestinal: Negative for abdominal pain, constipation, diarrhea, nausea and vomiting.  Genitourinary: Negative for dysuria and hematuria.  Musculoskeletal: Positive for arthralgias. Negative for joint swelling and myalgias.  Skin: Negative for color change and rash.  Allergic/Immunologic: Negative for immunocompromised state.  Neurological: Negative for tingling, weakness and numbness.  Psychiatric/Behavioral: Negative for confusion.   All other systems reviewed and are negative for acute change except as noted in the HPI.    Physical Exam Updated Vital Signs BP 133/79 (BP Location: Left Arm)   Pulse 90   Temp 98.5 F (36.9 C) (Oral)   Resp 16   Ht 5' 1.5" (1.562 m)   Wt 88.9 kg   LMP 06/22/2016 (Approximate)   SpO2 98%   Breastfeeding? Unknown   BMI 36.43 kg/m   Physical Exam  Constitutional: She is oriented to person, place, and time. Vital signs are normal. She appears well-developed and well-nourished.  Non-toxic appearance. No distress.  Afebrile, nontoxic, NAD, overweight pleasant AAF  HENT:  Head: Normocephalic and atraumatic.  Mouth/Throat: Mucous membranes are normal.  Eyes: Conjunctivae and EOM are normal. Right eye exhibits no discharge. Left eye exhibits no discharge.  Neck: Normal range of motion. Neck supple.  Cardiovascular: Normal rate and intact distal pulses.   Pulmonary/Chest: Effort normal. No respiratory distress.  Abdominal: Normal appearance. She exhibits no distension.  Musculoskeletal: Normal range of motion.        Left hip: She exhibits tenderness. She exhibits normal range of motion, normal strength, no bony tenderness, no swelling, no crepitus and no deformity.       Legs: L hip with FROM intact, with mild lateral joint line TTP near the TFL region and extending towards the glutes/buttocks, no bruising or swelling, no crepitus or deformity, no limb length discrepancy or abnormal rotation. No pain with log roll testing. Neg SLR testing. No focal anterior hip/thigh tenderness. no focal midline spinal TTP. Strength and sensation grossly intact, distal pulses intact, compartments soft.   Neurological: She is alert and oriented to person, place, and time. She has normal strength. No sensory deficit.  Skin: Skin is warm, dry and intact. No rash noted.  Psychiatric: She has a normal mood and affect. Her behavior is normal.  Nursing note and vitals reviewed.    ED Treatments / Results  Labs (all labs ordered are listed, but  only abnormal results are displayed) Labs Reviewed - No data to display  EKG  EKG Interpretation None       Radiology No results found.  Procedures Procedures (including critical care time)  Medications Ordered in ED Medications  ketorolac (TORADOL) 30 MG/ML injection 30 mg (30 mg Intramuscular Given 06/30/16 0511)     Initial Impression / Assessment and Plan / ED Course  I have reviewed the triage vital signs and the nursing notes.  Pertinent labs & imaging results that were available during my care of the patient were reviewed by me and considered in my medical decision making (see chart for details).     41 y.o. female here with L anterior thigh pain radiating to L posterior hip/buttock x3 weeks. No known injury or trauma. On exam, mild tenderness to glutes and lateral hip joint line, NVI with soft compartments, no swelling/skin changes, FROM intact, no limb length discrepancy. Likely hip related etiology (?arthritis) vs gluteus strain; doubt need for emergent  imaging since ortho would likely get their own imaging later anyway, and no injury/trauma to be concerned for occult fx. Will start on mobic daily, advised use of tylenol as well; heat use instructed. F/up with ortho in 1-2wks for recheck of symptoms and ongoing management of her hip pain. I explained the diagnosis and have given explicit precautions to return to the ER including for any other new or worsening symptoms. The patient understands and accepts the medical plan as it's been dictated and I have answered their questions. Discharge instructions concerning home care and prescriptions have been given. The patient is STABLE and is discharged to home in good condition.    Final Clinical Impressions(s) / ED Diagnoses   Final diagnoses:  Left hip pain  Left thigh pain    New Prescriptions New Prescriptions   MELOXICAM (MOBIC) 15 MG TABLET    Take 1 tablet (15 mg total) by mouth daily. TAKE WITH [address] [place], [place] 06/30/16 1610    Derwood Kaplan, MD 07/03/16 [phone]

## 2016-06-30 NOTE — Discharge Instructions (Signed)
Use heat to the area of pain, using a heat pad for no more than 20 minutes every hour. Use mobic daily as directed. Use additional tylenol as needed for additional pain relief. Call orthopedic follow up today or tomorrow to schedule followup appointment for recheck of ongoing hip/leg pain in 1-2 weeks, for ongoing evaluation of your symptoms. Return to the ER for changes or worsening symptoms.

## 2016-07-07 ENCOUNTER — Telehealth: Payer: Self-pay | Admitting: *Deleted

## 2016-07-07 NOTE — Telephone Encounter (Signed)
Pt called the office stating she had blood work done at there PCP and it came back positive for HSV, wanted to know if we had ever tested her for that.  Informed pt that I did not see a result for HSV and we typically do not test for it unless the pt comes in c/o symptoms associated with the virus.  Also informed pt that she needed a repeat BHCG due to missing her last appointment.  Scheduled rpt BHCG for 07-11-16.

## 2016-07-07 NOTE — Telephone Encounter (Signed)
Called pt, no answer, left message to call the office.  

## 2016-07-07 NOTE — Telephone Encounter (Signed)
-----   Message from Heather L Bacon, NT sent at 07/07/2016 10:27 AM EDT ----- Regarding: pt was just requesting lab results Pt called asking about lab results that were draw here in the office and also at here surgery, states that she was wondering if STD testing may have been done during any of this blood work. I resent here the code for her Mychart and explained that she could see her results and labs that were done on there. She said she would try to download, but also requested a call back, told her it would probably be next week before she heard from a nurse 

## 2016-07-07 NOTE — Telephone Encounter (Signed)
-----   Message from Lindell SparHeather L Bacon, [place] sent at 07/07/2016 10:27 AM EDT ----- Regarding: pt was just requesting lab results Pt called asking about lab results that were draw here in the office and also at here surgery, states that she was wondering if STD testing may have been done during any of this blood work. I resent here the code for her Mychart and explained that she could see her results and labs that were done on there. She said she would try to download, but also requested a call back, told her it would probably be next week before she heard from a nurse

## 2016-07-11 ENCOUNTER — Other Ambulatory Visit: Payer: Self-pay

## 2017-08-13 ENCOUNTER — Encounter: Payer: Self-pay | Admitting: Emergency Medicine

## 2017-08-13 ENCOUNTER — Emergency Department
Admission: EM | Admit: 2017-08-13 | Discharge: 2017-08-13 | Disposition: A | Discharge status: Home / Self Care | Payer: PRIVATE HEALTH INSURANCE | Attending: Emergency Medicine | Admitting: Emergency Medicine

## 2017-08-13 DIAGNOSIS — N760 Acute vaginitis: Secondary | ICD-10-CM | POA: Diagnosis not present

## 2017-08-13 DIAGNOSIS — F17210 Nicotine dependence, cigarettes, uncomplicated: Secondary | ICD-10-CM | POA: Diagnosis not present

## 2017-08-13 DIAGNOSIS — R300 Dysuria: Secondary | ICD-10-CM | POA: Diagnosis present

## 2017-08-13 DIAGNOSIS — J45998 Other asthma: Secondary | ICD-10-CM | POA: Insufficient documentation

## 2017-08-13 DIAGNOSIS — B9689 Other specified bacterial agents as the cause of diseases classified elsewhere: Secondary | ICD-10-CM

## 2017-08-13 DIAGNOSIS — Z79899 Other long term (current) drug therapy: Secondary | ICD-10-CM | POA: Diagnosis not present

## 2017-08-13 LAB — WET PREP, GENITAL
SPERM: NONE SEEN
Trich, Wet Prep: NONE SEEN
Yeast Wet Prep HPF POC: NONE SEEN

## 2017-08-13 LAB — URINALYSIS, COMPLETE (UACMP) WITH MICROSCOPIC
BILIRUBIN URINE: NEGATIVE
Glucose, UA: NEGATIVE mg/dL
Ketones, ur: NEGATIVE mg/dL
Nitrite: POSITIVE — AB
PH: 5 (ref 5.0–8.0)
Protein, ur: 30 mg/dL — AB
RBC / HPF: >50 RBC/hpf — ABNORMAL HIGH (ref 0–5)
SPECIFIC GRAVITY, URINE: 1.025 (ref 1.005–1.030)

## 2017-08-13 LAB — POCT PREGNANCY, URINE: PREG TEST UR: NEGATIVE

## 2017-08-13 LAB — CHLAMYDIA/NGC RT PCR (ARMC ONLY)
Chlamydia Tr: NOT DETECTED
N gonorrhoeae: NOT DETECTED

## 2017-08-13 MED ORDER — METRONIDAZOLE 500 MG PO TABS: 500.0000 mg | ORAL_TABLET | Freq: Two times a day (BID) | ORAL | 0 refills | Status: AC

## 2017-08-13 MED ORDER — PHENAZOPYRIDINE HCL 200 MG PO TABS: 200.0000 mg | ORAL_TABLET | Freq: Three times a day (TID) | ORAL | 0 refills | Status: DC | PRN

## 2017-08-13 NOTE — ED Triage Notes (Signed)
Pt denies abdominal pain and states that the pain with urination started yesterday.
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
## 2017-08-13 NOTE — Discharge Instructions (Signed)
Take medication as directed.

## 2017-08-13 NOTE — ED Notes (Signed)
See triage note  States developed some dysuria yesterday  But noticed some blood with wiping this am

## 2017-08-13 NOTE — ED Provider Notes (Addendum)
[facility] Emergency Department Provider Note   ____________________________________________   First MD Initiated Contact with Patient 08/13/17 0719     (approximate)  I have reviewed the triage vital signs and the nursing notes.   HISTORY  Chief Complaint SEXUALLY TRANSMITTED DISEASE and Dysuria    HPI Robyn Barker is a 42 y.o. female patient complaining of dysuria and spotting which started yesterday.  Patient denies vaginal discharge, flank pain, or fever.  Patient had a last menstrual period was 7 to 10 days ago.  Patient is concerned because her last sexual partner reported being treated for dysuria.  Patient did not know if you are being treated for sexually transmitted disease.  Past Medical History:  Diagnosis Date  . Asthma   . Genital herpes   . High cholesterol     Patient Active Problem List   Diagnosis Date Noted  . Ectopic pregnancy 05/20/2016    Past Surgical History:  Procedure Laterality Date  . DIAGNOSTIC LAPAROSCOPY WITH REMOVAL OF ECTOPIC PREGNANCY N/A 05/15/2015   Procedure: DIAGNOSTIC LAPAROSCOPY WITH REMOVAL OF ECTOPIC PREGNANCY;  Surgeon: Reva Bores, MD;  Location: [facility];  Service: Gynecology;  Laterality: N/A;  . DIAGNOSTIC LAPAROSCOPY WITH REMOVAL OF ECTOPIC PREGNANCY Bilateral 05/20/2016   Procedure: DIAGNOSTIC LAPAROSCOPY;  Surgeon: Tereso Newcomer, MD;  Location: [facility];  Service: Gynecology;  Laterality: Bilateral;  . ovarian cyst removed    . SALPINGECTOMY Right 05/15/2015  . UNILATERAL SALPINGECTOMY Left 05/20/2016   Procedure: UNILATERAL SALPINGECTOMY;  Surgeon: Tereso Newcomer, MD;  Location: [facility];  Service: Gynecology;  Laterality: Left;    Prior to Admission medications   Medication Sig Start Date End Date Taking? Authorizing Provider  Cholecalciferol (VITAMIN D3) 5000 UNITS CAPS Take 5,000 Units by mouth daily.    [provider]  docusate sodium (COLACE) 100 MG capsule Take 1 capsule  (100 mg total) by mouth 2 (two) times daily as needed. 05/20/16   Anyanwu, Jethro Bastos, MD  fluticasone (FLONASE) 50 MCG/ACT nasal spray Place 2 sprays into both nostrils daily.    [provider]  ibuprofen (ADVIL,MOTRIN) 600 MG tablet Take 1 tablet (600 mg total) by mouth every 6 (six) hours as needed. 05/20/16   Anyanwu, Jethro Bastos, MD  meloxicam (MOBIC) 15 MG tablet Take 1 tablet (15 mg total) by mouth daily. TAKE WITH MEALS 06/30/16   Street, [place], PA-C  metroNIDAZOLE (FLAGYL) 500 MG tablet Take 1 tablet (500 mg total) by mouth 2 (two) times daily for 7 days. 08/13/17 08/20/17  Joni Reining, PA-C  oxyCODONE-acetaminophen (PERCOCET/ROXICET) 5-325 MG tablet Take 1-2 tablets by mouth every 6 (six) hours as needed. 05/20/16   Anyanwu, Jethro Bastos, MD  phenazopyridine (PYRIDIUM) 200 MG tablet Take 1 tablet (200 mg total) by mouth 3 (three) times daily as needed for pain. 08/13/17   Joni Reining, PA-C  PROAIR HFA 108 [phone] Base) MCG/ACT inhaler INHALE 2 PUFFS EVERY FOUR TO SIX HOURS AS NEEDED 05/10/15   [provider]  simvastatin (ZOCOR) 20 MG tablet Take 20 mg by mouth daily.    [provider]    Allergies Patient has no known allergies.  Family History  Problem Relation Age of Onset  . Breast cancer Maternal Aunt     Social History Social History   Tobacco Use  . Smoking status: Current Every Day Smoker    Packs/day: 0.00    Years: 0.00    Pack years: 0.00  Types: Cigarettes  . Smokeless tobacco: Never Used  Substance Use Topics  . Alcohol use: Yes    Comment: occasionally  . Drug use: Yes    Types: Marijuana    Comment: last smoked within the last month    Review of Systems  Constitutional: No fever/chills Eyes: No visual changes. ENT: No sore throat. Cardiovascular: Denies chest pain. Respiratory: Denies shortness of breath. Gastrointestinal: No abdominal pain.  No nausea, no vomiting.  No diarrhea.  No constipation. Genitourinary: Positive for  dysuria. Musculoskeletal: Negative for back pain. Skin: Negative for rash. Neurological: Negative for headaches, focal weakness or numbness. Endocrine:Hyperlipidemia  ____________________________________________   PHYSICAL EXAM:  VITAL SIGNS: ED Triage Vitals [08/13/17 0710]  Enc Vitals Group     BP (!) 142/97     Pulse Rate 89     Resp 20     Temp 98.6 F (37 C)     Temp Source Oral     SpO2 98 %     Weight 280 lb (127 kg)     Height 5\' 1"  (1.549 m)     Head Circumference      Peak Flow      Pain Score 0     Pain Loc      Pain Edu?      Excl. in GC?    Constitutional: Alert and oriented. Well appearing and in no acute distress. Cardiovascular: Normal rate, regular rhythm. Grossly normal heart sounds.  Good peripheral circulation. Respiratory: Normal respiratory effort.  No retractions. Lungs CTAB. Genitourinary: Chaperone vaginal exam shows no external or internal lesions. Skin:  Skin is warm, dry and intact. No rash noted. Psychiatric: Mood and affect are normal. Speech and behavior are normal.  ____________________________________________   LABS (all labs ordered are listed, but only abnormal results are displayed)  Labs Reviewed  WET PREP, GENITAL - Abnormal; Notable for the following components:      Result Value   Clue Cells Wet Prep HPF POC PRESENT (*)    WBC, Wet Prep HPF POC RARE (*)    All other components within normal limits  URINALYSIS, COMPLETE (UACMP) WITH MICROSCOPIC - Abnormal; Notable for the following components:   Color, Urine YELLOW (*)    APPearance CLOUDY (*)    Hgb urine dipstick LARGE (*)    Protein, ur 30 (*)    Nitrite POSITIVE (*)    Leukocytes, UA MODERATE (*)    RBC / HPF >50 (*)    Bacteria, UA RARE (*)    All other components within normal limits  CHLAMYDIA/NGC RT PCR ([facility] ONLY)  POC URINE PREG, ED  POCT PREGNANCY, URINE    ____________________________________________  EKG   ____________________________________________  RADIOLOGY  ED MD interpretation:    Official radiology report(s): No results found.  ____________________________________________   PROCEDURES  Procedure(s) performed: None  Procedures  Critical Care performed: No  ____________________________________________   INITIAL IMPRESSION / ASSESSMENT AND PLAN / ED COURSE  As part of my medical decision making, I reviewed the following data within the electronic MEDICAL RECORD NUMBER    Patient presents with 1 day of lower abdominal pain and dysuria.  Patient labs consistent with bacterial vaginitis.  Patient given discharge care instruction advised take medication as directed.  Patient advised follow-up PCP.      ____________________________________________   FINAL CLINICAL IMPRESSION(S) / ED DIAGNOSES  Final diagnoses:  BV (bacterial vaginosis)     ED Discharge Orders        Ordered  metroNIDAZOLE (FLAGYL) 500 MG tablet  2 times daily     08/13/17 1015    phenazopyridine (PYRIDIUM) 200 MG tablet  3 times daily PRN     08/13/17 1015       Note:  This document was prepared using Dragon voice recognition software and may include unintentional dictation errors.    Joni Reining, PA-C 08/13/17 1017    Joni Reining, PA-C 08/13/17 1022    Emily Filbert, MD 08/13/17 1048

## 2018-03-19 IMAGING — US US OB TRANSVAGINAL
1 series · 15 of 28 positions shown · non-contrast
Comparison: 05/10/2016

CLINICAL DATA: Abdominal pain since this morning, prior ectopic
pregnancy and LEFT salpingectomy last year ; quantitative beta HCG =
329

EXAM:
TRANSVAGINAL OB ULTRASOUND
TECHNIQUE: Transvaginal ultrasound was performed for complete evaluation of the
gestation as well as the maternal uterus, adnexal regions, and
pelvic cul-de-sac.

[Series 1: us ob transvaginal · 15 of 59 slices shown]
[im 1/59]
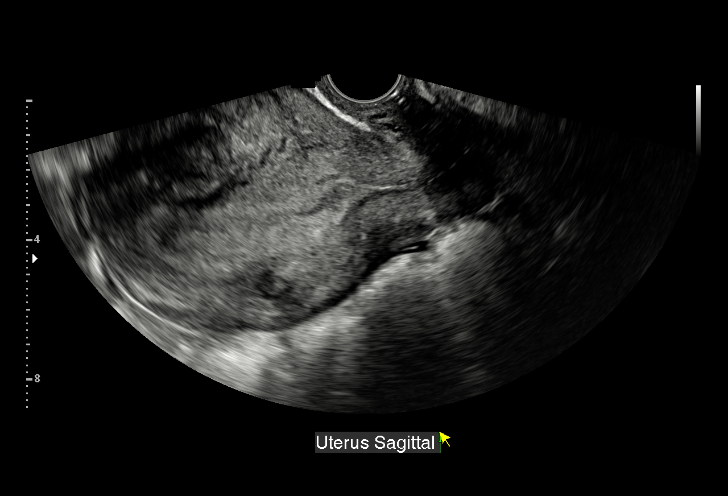
[im 5/59]
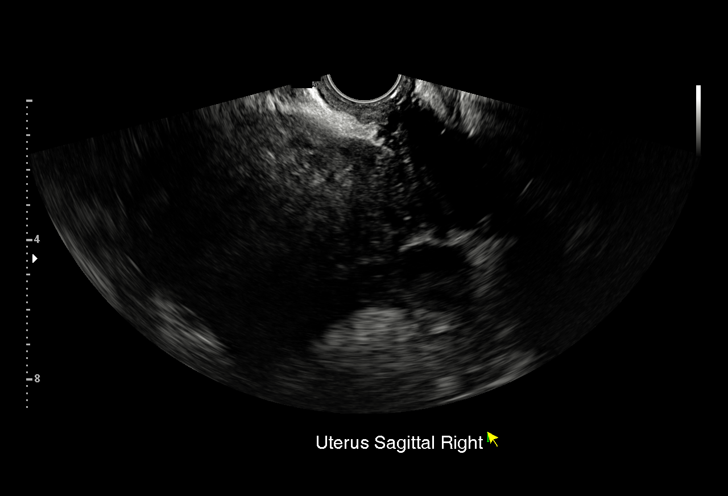
[im 9/59]
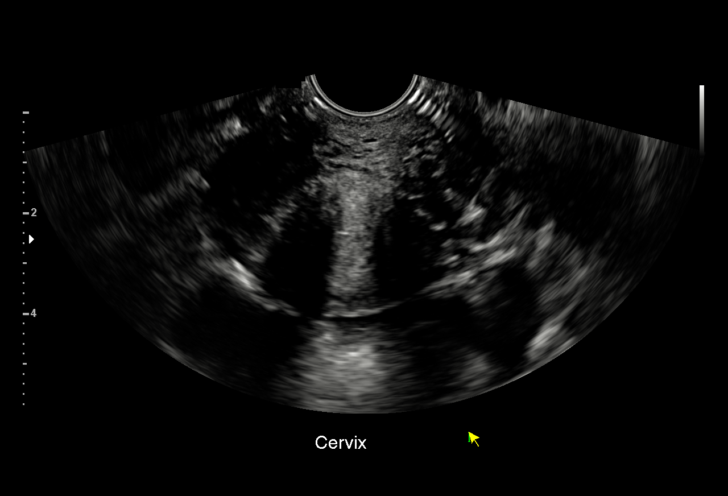
[im 13/59]
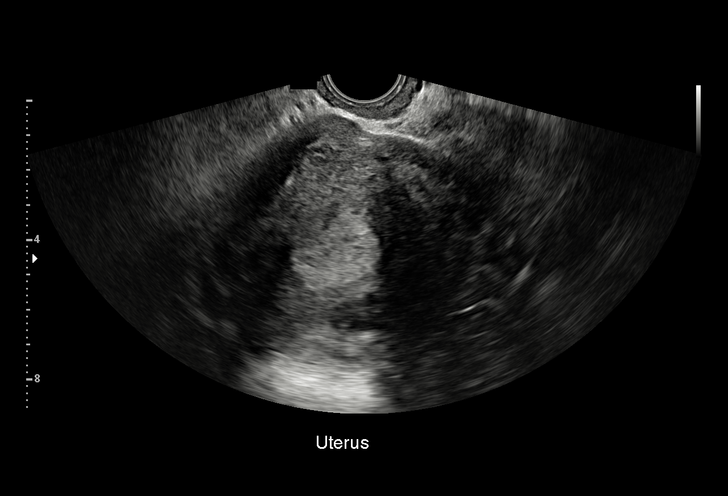
[im 18/59]
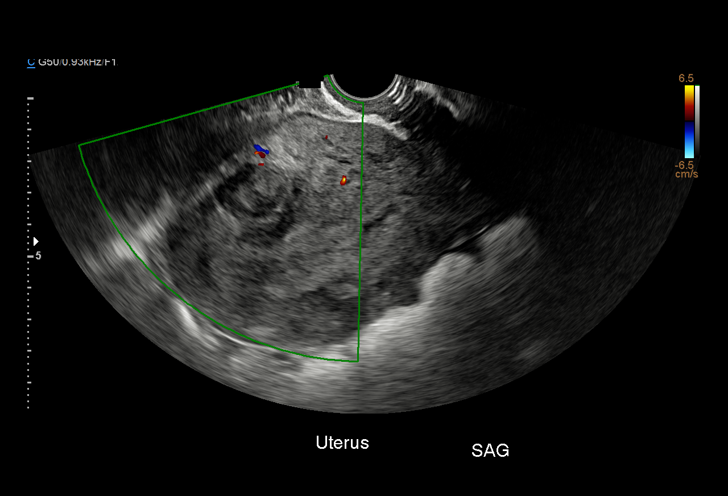
[im 22/59]
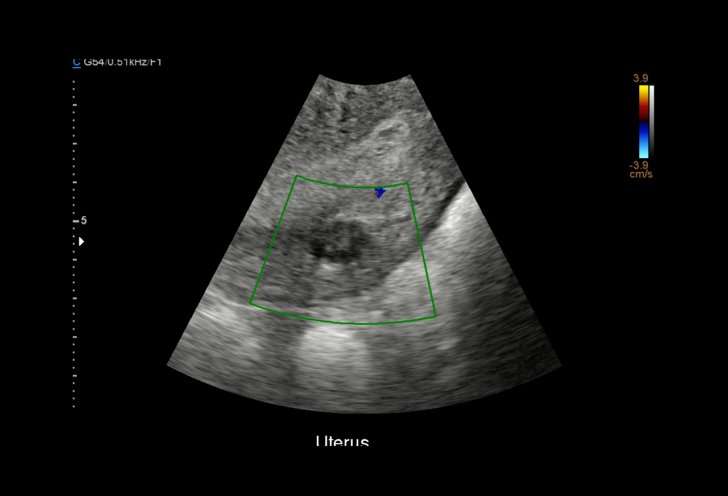
[im 26/59]
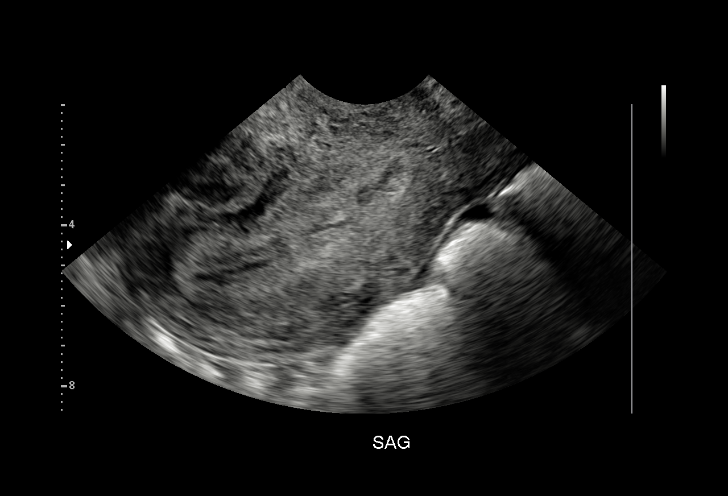
[im 31/59]
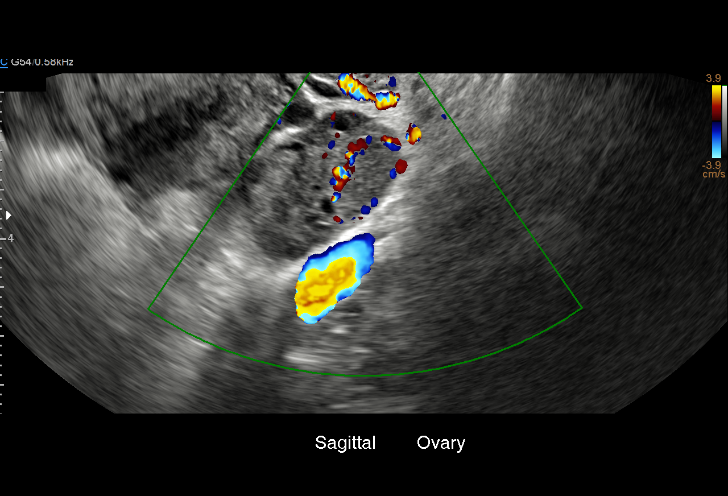
[im 33/59]
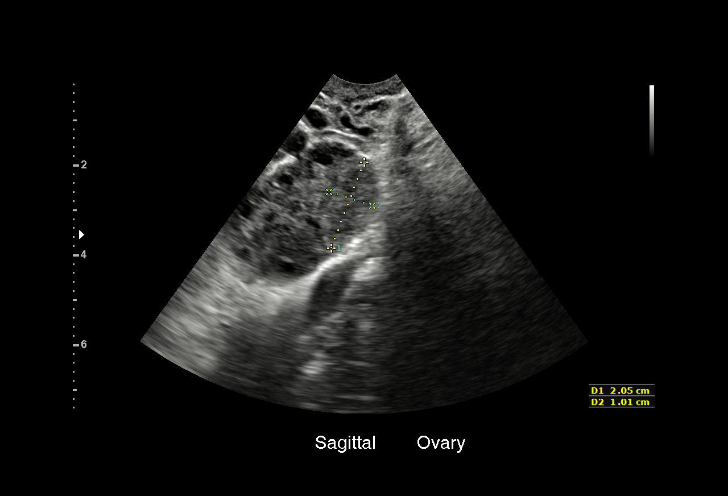
[im 37/59]
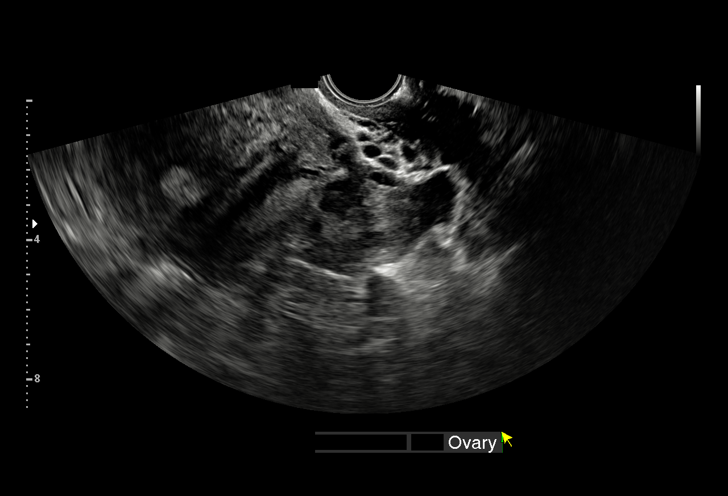
[im 41/59]
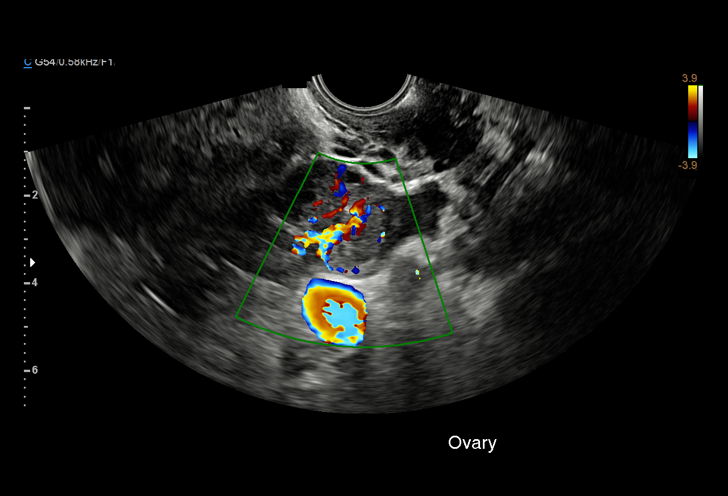
[im 46/59]
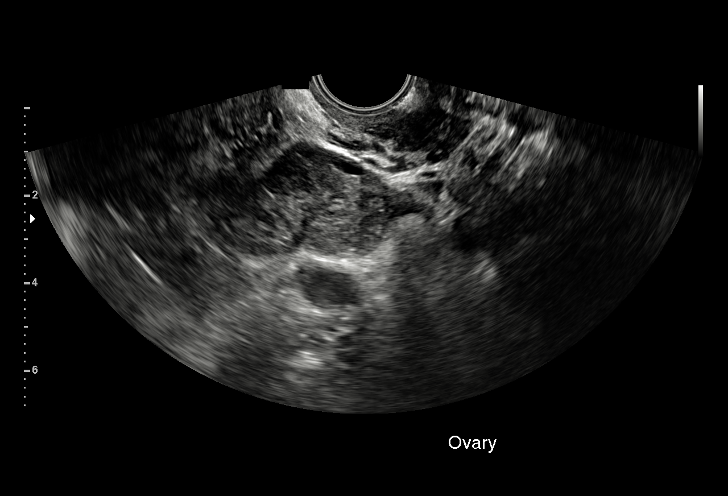
[im 50/59]
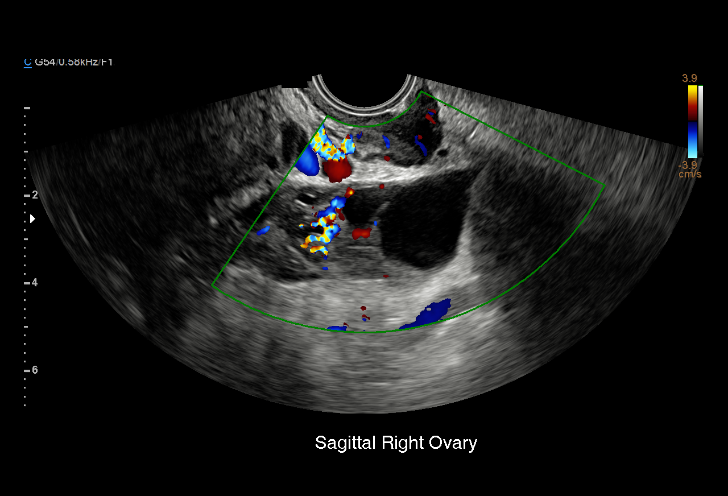
[im 54/59]
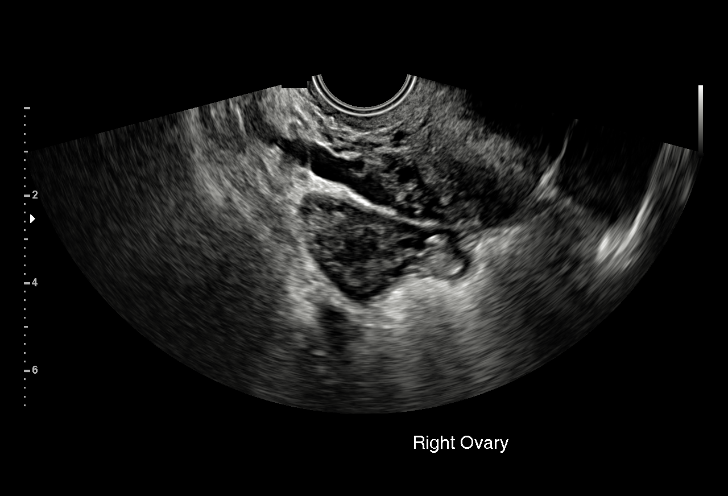
[im 59/59]
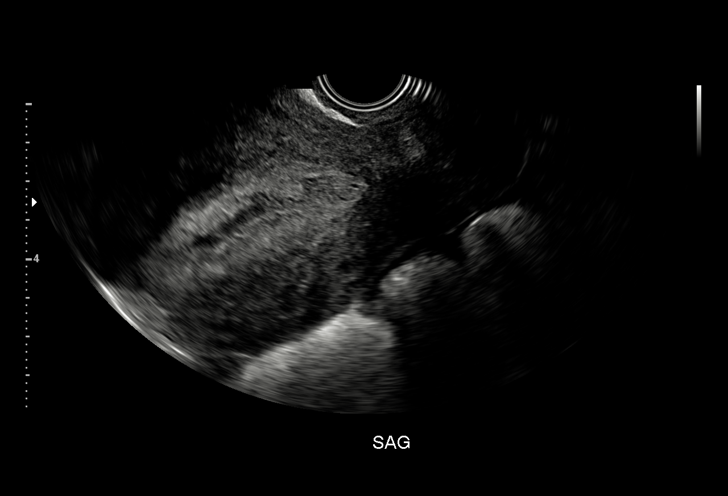

[15 of 28 positions shown; findings below may reference images not displayed]

FINDINGS: Intrauterine gestational sac: Not visualized

Yolk sac:  N/A

Embryo:  N/A

Cardiac Activity: N/A

Heart Rate: N/A bpm

Subchorionic hemorrhage:  N/A

Maternal uterus/adnexae:

Intramural uterine leiomyoma anterior upper to mid uterine segment,
2.1 x 3.1 x 2.8 cm.

Small intramural leiomyoma 1.5 x 1.1 x 2.0 cm posterior mid uterus.

Endometrial complex 21 mm thick without gestational sac or
endometrial fluid.

RIGHT ovary normal size and morphology 5.5 x 2.2 x 2.6 cm.

LEFT ovary measures 3.6 x 4.0 x 2.5 cm and contains a hyperechoic
nodule 2.1 x 1.0 x 1.9 cm question hemorrhagic corpus luteum.

Blood flow seen within both ovaries on color Doppler imaging.

Small amount of free pelvic fluid.

No adnexal masses.
IMPRESSION: No intrauterine gestation identified.

Findings are compatible with pregnancy of unknown location.
Differential diagnosis includes early intrauterine pregnancy too
early to visualize, spontaneous abortion, and ectopic pregnancy.
Serial quantitative beta HCG and or followup ultrasound recommended
to definitively exclude ectopic pregnancy.

Small uterine fibroids.

## 2018-03-21 IMAGING — US US OB TRANSVAGINAL
1 series · 15 of 28 positions shown · non-contrast
Comparison: 05/18/2016

ADDENDUM:
Case was re-reviewed following discussion with Joachim Du Bellay Mimy Orange.

On the technologist's worksheet, it is stated the patient had a left
oophorectomy. The patient has not had a left oophorectomy. Patient
has had a right salpingectomy.
No simple or hemorrhagic pelvic fluid is identified.
CLINICAL DATA: Six weeks of pain.  Left oophorectomy.
EXAM:
OBSTETRIC <14 WK ULTRASOUND
TECHNIQUE: Transabdominal ultrasound was performed for evaluation of the
gestation as well as the maternal uterus and adnexal regions.

[Series 1: us ob transvaginal · 35 acquisitions, 15 frames shown]
[im 1/35]
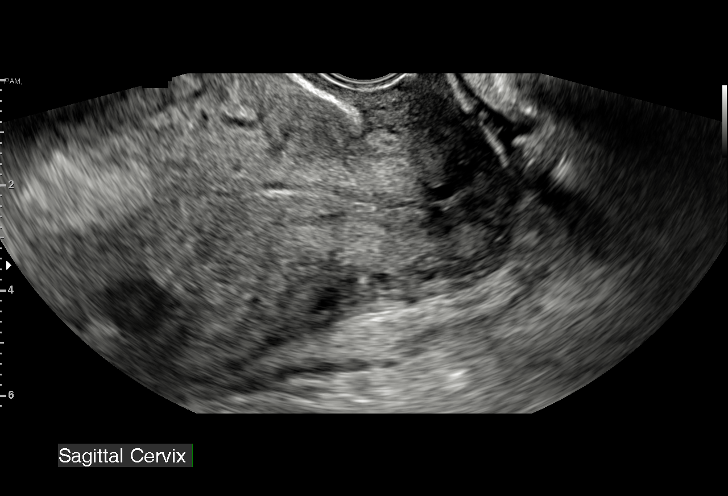
[im 3/35]
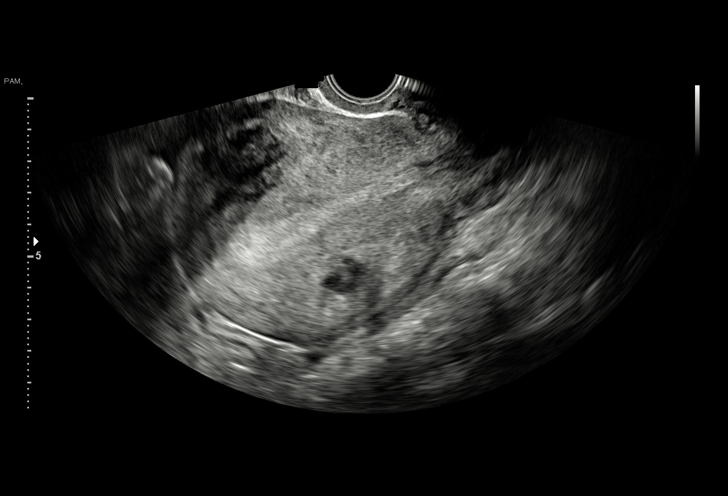
[im 6/35]
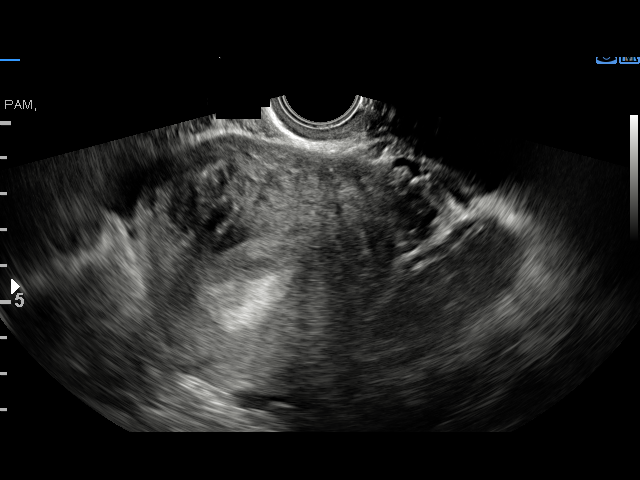
[im 8/35]
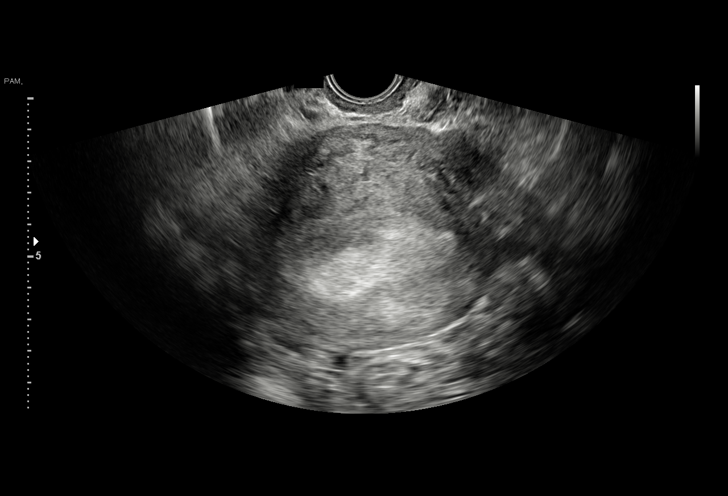
[im 11/35]
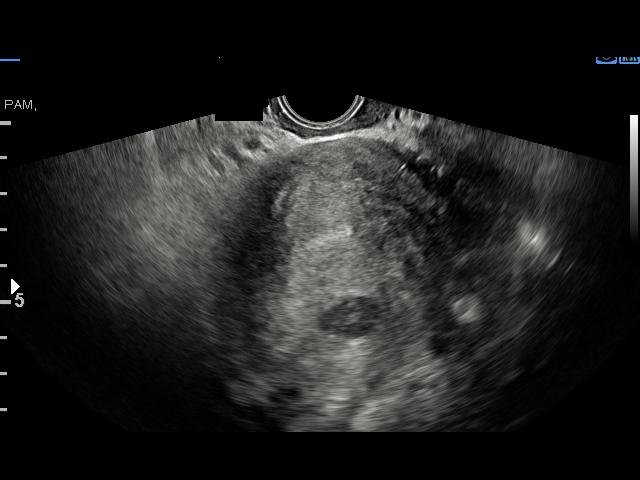
[im 13/35]
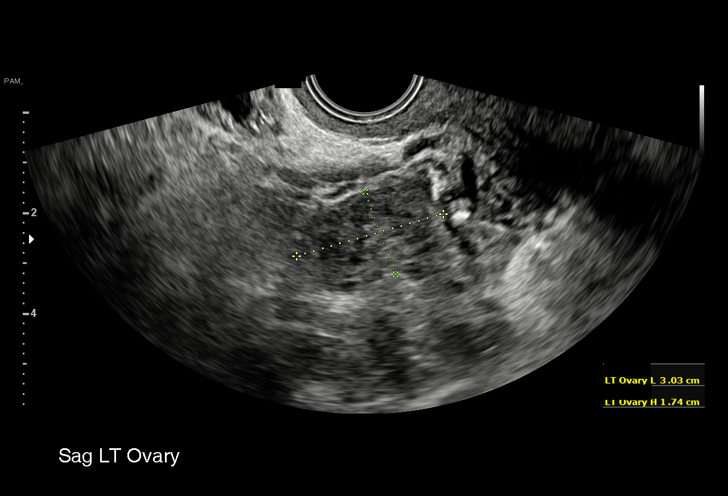
[im 16/35]
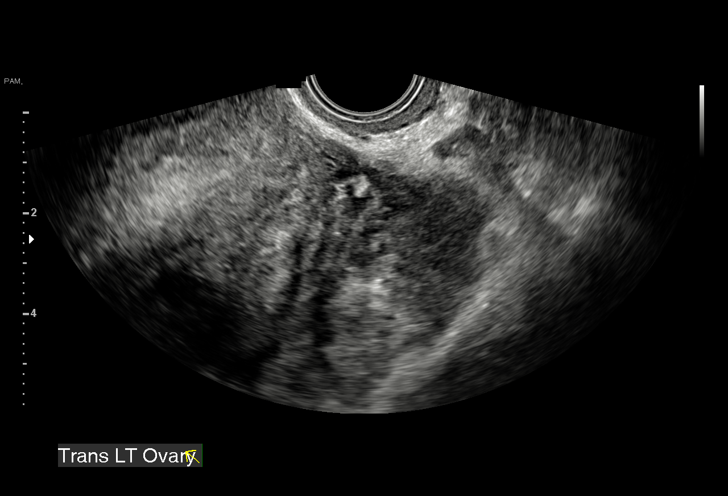
[im 18/35]
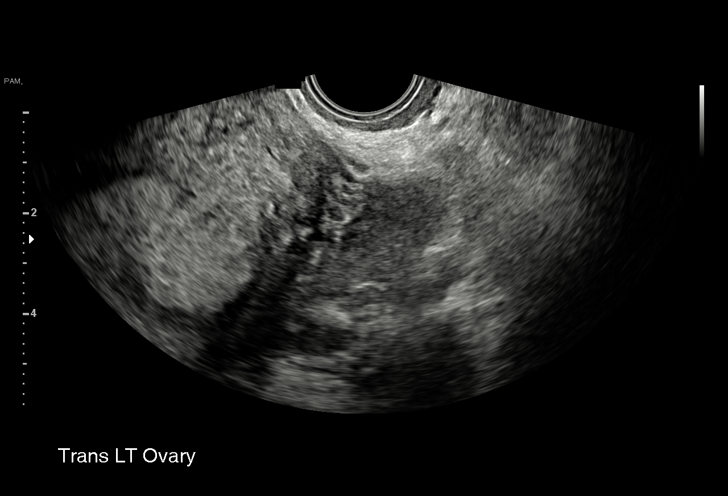
[im 19/35]
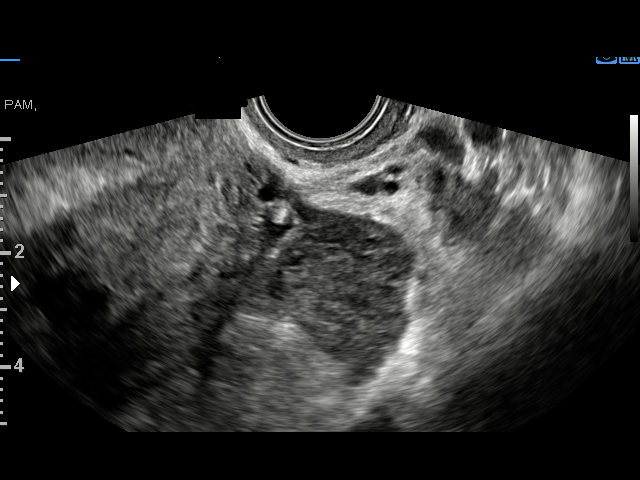
[im 22/35]
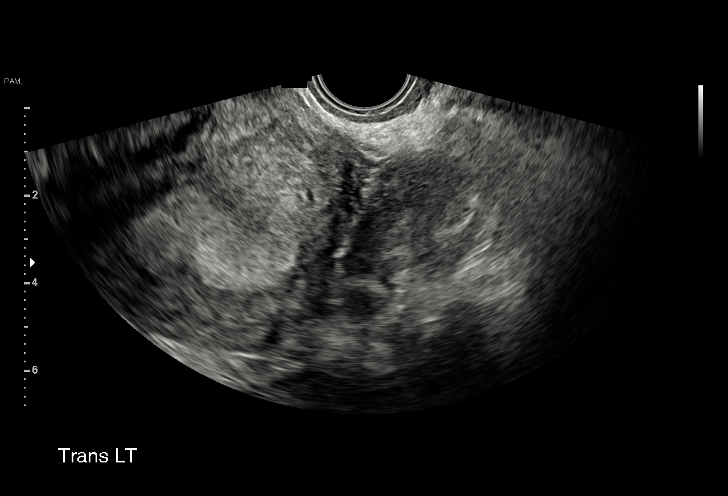
[im 24/35]
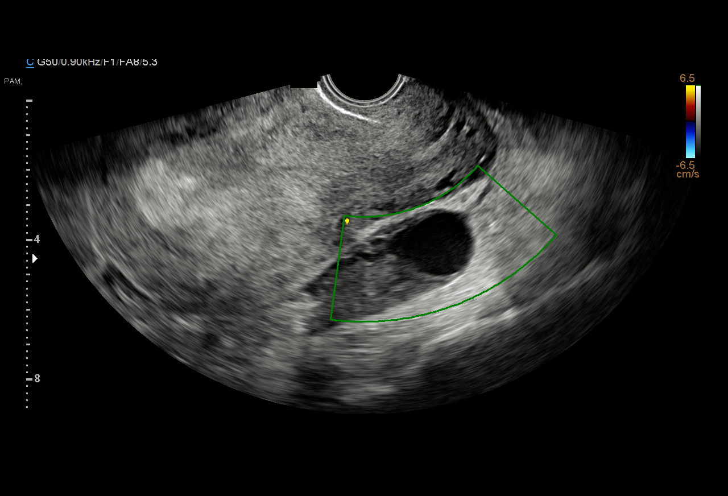
[im 27/35]
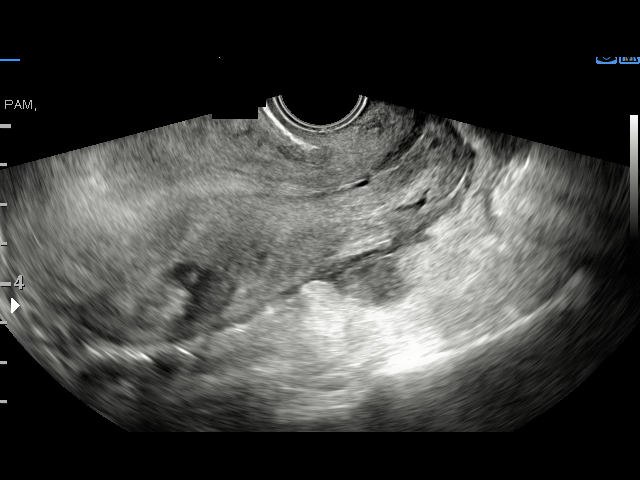
[im 29/35]
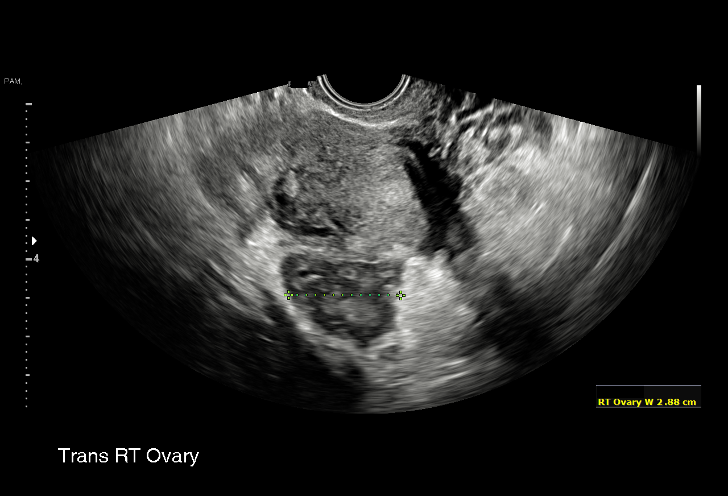
[im 32/35]
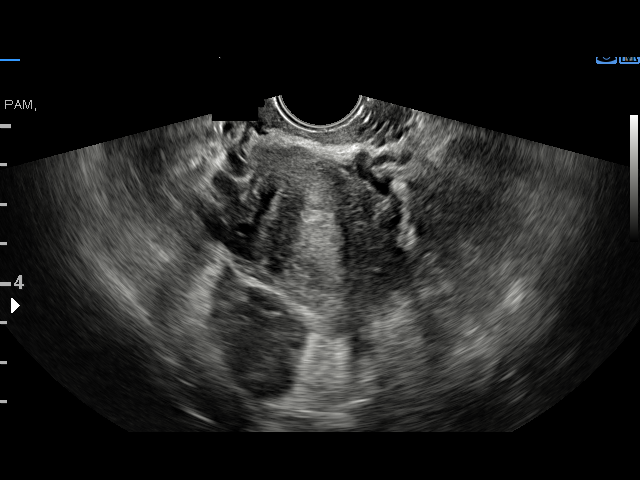
[im 35/35]
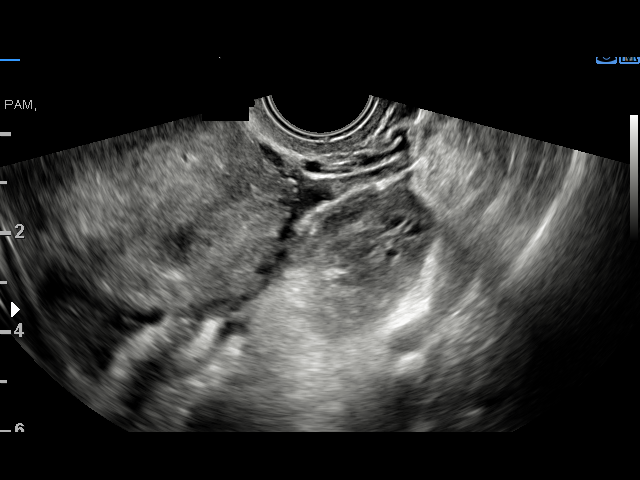

[15 of 28 positions shown; findings below may reference images not displayed]

FINDINGS: Intrauterine gestational sac: None

Yolk sac:  Not Visualized.

Embryo:  Not Visualized.

Cardiac Activity: Not Visualized.

Subchorionic hemorrhage:  None visualized.

Maternal uterus/adnexae: 14 mm posterior uterine hypoechoic mass
consistent with a fibroid. Bilateral ovaries are normal in size.
Right ovary measures 6 x 2.4 x 2.9 cm. Left ovary measures 3 x 1.7 x
2.4 cm. Left adnexal 2.7 x 2.8 cm hyperechoic avascular mass which
may be adjacent to versus within the left ovary.
IMPRESSION: 1. No intrauterine pregnancy identified. Given the down trending
beta HCG, this likely reflects a missed abortion. Recommend clinical
correlation, serial quantitative beta HCGs, and followup ultrasound
as clinically indicated.
2. Left adnexal 2.7 x 2.8 cm hyperechoic avascular mass which may be
adjacent to versus within the left ovary. This may reflect a
hemorrhagic corpus luteum cyst versus a hematoma given the patient
has had left oophorectomy 6 weeks ago.

## 2018-09-04 ENCOUNTER — Other Ambulatory Visit: Payer: Self-pay

## 2018-09-04 ENCOUNTER — Emergency Department
Admission: EM | Admit: 2018-09-04 | Discharge: 2018-09-04 | Disposition: A | Discharge status: Home / Self Care | Payer: PRIVATE HEALTH INSURANCE | Attending: Emergency Medicine | Admitting: Emergency Medicine

## 2018-09-04 ENCOUNTER — Encounter: Payer: Self-pay | Admitting: Emergency Medicine

## 2018-09-04 DIAGNOSIS — N760 Acute vaginitis: Secondary | ICD-10-CM | POA: Insufficient documentation

## 2018-09-04 DIAGNOSIS — J45909 Unspecified asthma, uncomplicated: Secondary | ICD-10-CM | POA: Insufficient documentation

## 2018-09-04 DIAGNOSIS — F17210 Nicotine dependence, cigarettes, uncomplicated: Secondary | ICD-10-CM | POA: Insufficient documentation

## 2018-09-04 DIAGNOSIS — B9689 Other specified bacterial agents as the cause of diseases classified elsewhere: Secondary | ICD-10-CM

## 2018-09-04 LAB — URINALYSIS, COMPLETE (UACMP) WITH MICROSCOPIC
Bacteria, UA: NONE SEEN
Bilirubin Urine: NEGATIVE
Glucose, UA: NEGATIVE mg/dL
Ketones, ur: NEGATIVE mg/dL
Nitrite: NEGATIVE
Protein, ur: NEGATIVE mg/dL
Specific Gravity, Urine: 1.028 (ref 1.005–1.030)
pH: 5 (ref 5.0–8.0)

## 2018-09-04 LAB — CBC WITH DIFFERENTIAL/PLATELET
Abs Immature Granulocytes: 0.05 10*3/uL (ref 0.00–0.07)
Basophils Absolute: 0.1 10*3/uL (ref 0.0–0.1)
Basophils Relative: 1 %
Eosinophils Absolute: 1 10*3/uL — ABNORMAL HIGH (ref 0.0–0.5)
Eosinophils Relative: 7 %
HCT: 39.4 % (ref 36.0–46.0)
Hemoglobin: 12.7 g/dL (ref 12.0–15.0)
Immature Granulocytes: 0 %
Lymphocytes Relative: 26 %
Lymphs Abs: 3.6 10*3/uL (ref 0.7–4.0)
MCH: 28.9 pg (ref 26.0–34.0)
MCHC: 32.2 g/dL (ref 30.0–36.0)
MCV: 89.7 fL (ref 80.0–100.0)
Monocytes Absolute: 0.9 10*3/uL (ref 0.1–1.0)
Monocytes Relative: 6 %
Neutro Abs: 7.9 10*3/uL — ABNORMAL HIGH (ref 1.7–7.7)
Neutrophils Relative %: 60 %
Platelets: 355 10*3/uL (ref 150–400)
RBC: 4.39 MIL/uL (ref 3.87–5.11)
RDW: 13.7 % (ref 11.5–15.5)
WBC: 13.5 10*3/uL — ABNORMAL HIGH (ref 4.0–10.5)
nRBC: 0 % (ref 0.0–0.2)

## 2018-09-04 LAB — COMPREHENSIVE METABOLIC PANEL
ALT: 12 U/L (ref 0–44)
AST: 15 U/L (ref 15–41)
Albumin: 4 g/dL (ref 3.5–5.0)
Alkaline Phosphatase: 45 U/L (ref 38–126)
Anion gap: 9 (ref 5–15)
BUN: 11 mg/dL (ref 6–20)
CO2: 24 mmol/L (ref 22–32)
Calcium: 9.1 mg/dL (ref 8.9–10.3)
Chloride: 107 mmol/L (ref 98–111)
Creatinine, Ser: 0.76 mg/dL (ref 0.44–1.00)
GFR calc Af Amer: >60 mL/min (ref >60)
GFR calc non Af Amer: >60 mL/min (ref >60)
Glucose, Bld: 92 mg/dL (ref 70–99)
Potassium: 3.7 mmol/L (ref 3.5–5.1)
Sodium: 140 mmol/L (ref 135–145)
Total Bilirubin: 0.4 mg/dL (ref 0.3–1.2)
Total Protein: 7.4 g/dL (ref 6.5–8.1)

## 2018-09-04 LAB — WET PREP, GENITAL
Sperm: NONE SEEN
Trich, Wet Prep: NONE SEEN
Yeast Wet Prep HPF POC: NONE SEEN

## 2018-09-04 LAB — CHLAMYDIA/NGC RT PCR (ARMC ONLY)
Chlamydia Tr: DETECTED — AB
N gonorrhoeae: NOT DETECTED

## 2018-09-04 LAB — POCT PREGNANCY, URINE: Preg Test, Ur: NEGATIVE

## 2018-09-04 LAB — LIPASE, BLOOD: Lipase: 33 U/L (ref 11–51)

## 2018-09-04 MED ORDER — METRONIDAZOLE 500 MG PO TABS: 500.0000 mg | ORAL_TABLET | Freq: Two times a day (BID) | ORAL | 0 refills | Status: AC

## 2018-09-04 MED ORDER — AZITHROMYCIN 500 MG PO TABS
1000.0000 mg | ORAL_TABLET | Freq: Once | ORAL | Status: AC
  Administered 2018-09-04: 10:00:00 1000 mg via ORAL
  Filled 2018-09-04: qty 2

## 2018-09-04 MED ORDER — CEFTRIAXONE SODIUM 250 MG IJ SOLR
250.0000 mg | Freq: Once | INTRAMUSCULAR | Status: AC
  Administered 2018-09-04: 10:00:00 250 mg via INTRAMUSCULAR
  Filled 2018-09-04: qty 250

## 2018-09-04 NOTE — ED Notes (Signed)
Pt awakened by this RN to re-obtain VS. Pt states "they already did that". This RN explained needed to obtain VS again. Pt states understanding. Pt back to sleep at this time. NAD noted. Will continue to monitor for further patient needs.

## 2018-09-04 NOTE — ED Notes (Signed)
ED Provider at bedside. 

## 2018-09-04 NOTE — ED Triage Notes (Signed)
Patient ambulatory to triage with steady gait, without difficulty or distress noted, mask in place; pt reports x 5-6 days having rt lower abd pain accomp by vag bleeding/discharge and odor "but it's not fishy"; st "I hope I don't have a STD"

## 2018-09-04 NOTE — Discharge Instructions (Addendum)
You had no abdominal pain when you arrived emergency room.  Therefore we did not do any further imaging.  If your abdominal pain returns with fevers or vomiting you should return to the ER.  We have treated you for bacterial vaginosis.  We also treat you prophylactically for gonorrhea and chlamydia.

## 2018-09-04 NOTE — ED Notes (Signed)
Pt alert and oriented X4, active, cooperative, pt in NAD. RR even and unlabored, color WNL.  Pt informed to return if any life threatening symptoms occur.  Discharge and followup instructions reviewed. Ambulates safely. 

## 2018-09-04 NOTE — ED Provider Notes (Signed)
[facility] Emergency Department Provider Note  ____________________________________________   First MD Initiated Contact with Patient 09/04/18 [phone]     (approximate)  I have reviewed the triage vital signs and the nursing notes.   HISTORY  Chief Complaint Abdominal Pain    HPI Robyn Barker is a 43 y.o. female with prior history of genital herpes who presents with 5 to 6 days of right lower quadrant pain.  Patient denies that she currently has pain.  She says that she presented today due to increasing vaginal discharge and had a light vaginal bleed as well.  Vaginal discharge is moderate, constant, nothing makes it better, nothing makes it worse, associate with a foul smell.  She said her period went off  the early of July so was little abnormal for her to start bleeding again.  She has had prior ectopic pregnancies and had both fallopian tubes already removed.            Past Medical History:  Diagnosis Date  . Asthma   . Genital herpes   . High cholesterol     Patient Active Problem List   Diagnosis Date Noted  . Ectopic pregnancy 05/20/2016    Past Surgical History:  Procedure Laterality Date  . DIAGNOSTIC LAPAROSCOPY WITH REMOVAL OF ECTOPIC PREGNANCY N/A 05/15/2015   Procedure: DIAGNOSTIC LAPAROSCOPY WITH REMOVAL OF ECTOPIC PREGNANCY;  Surgeon: Donnamae Jude, MD;  Location: [facility];  Service: Gynecology;  Laterality: N/A;  . DIAGNOSTIC LAPAROSCOPY WITH REMOVAL OF ECTOPIC PREGNANCY Bilateral 05/20/2016   Procedure: DIAGNOSTIC LAPAROSCOPY;  Surgeon: Osborne Oman, MD;  Location: [facility];  Service: Gynecology;  Laterality: Bilateral;  . ovarian cyst removed    . SALPINGECTOMY Right 05/15/2015  . UNILATERAL SALPINGECTOMY Left 05/20/2016   Procedure: UNILATERAL SALPINGECTOMY;  Surgeon: Osborne Oman, MD;  Location: [facility];  Service: Gynecology;  Laterality: Left;    Prior to Admission medications   Medication Sig Start Date End Date  Taking? Authorizing Provider  Cholecalciferol (VITAMIN D3) 5000 UNITS CAPS Take 5,000 Units by mouth daily.    [provider]  docusate sodium (COLACE) 100 MG capsule Take 1 capsule (100 mg total) by mouth 2 (two) times daily as needed. 05/20/16   Anyanwu, Sallyanne Havers, MD  fluticasone (FLONASE) 50 MCG/ACT nasal spray Place 2 sprays into both nostrils daily.    [provider]  ibuprofen (ADVIL,MOTRIN) 600 MG tablet Take 1 tablet (600 mg total) by mouth every 6 (six) hours as needed. 05/20/16   Anyanwu, Sallyanne Havers, MD  meloxicam (MOBIC) 15 MG tablet Take 1 tablet (15 mg total) by mouth daily. TAKE WITH MEALS 06/30/16   Street, [place], [place]  oxyCODONE-acetaminophen (PERCOCET/ROXICET) 5-325 MG tablet Take 1-2 tablets by mouth every 6 (six) hours as needed. 05/20/16   Anyanwu, Sallyanne Havers, MD  phenazopyridine (PYRIDIUM) 200 MG tablet Take 1 tablet (200 mg total) by mouth 3 (three) times daily as needed for pain. 08/13/17   Sable Feil, PA-C  PROAIR HFA 108 [phone] Base) MCG/ACT inhaler INHALE 2 PUFFS EVERY FOUR TO SIX HOURS AS NEEDED 05/10/15   [provider]  simvastatin (ZOCOR) 20 MG tablet Take 20 mg by mouth daily.    [provider]    Allergies Patient has no known allergies.  Family History  Problem Relation Age of Onset  . Breast cancer Maternal Aunt     Social History Social History   Tobacco Use  . Smoking status: Current Every Day  Smoker    Packs/day: 0.00    Years: 0.00    Pack years: 0.00    Types: Cigarettes  . Smokeless tobacco: Never Used  Substance Use Topics  . Alcohol use: Yes    Comment: occasionally  . Drug use: Yes    Types: Marijuana    Comment: last smoked within the last month      Review of Systems Constitutional: No fever/chills Eyes: No visual changes. ENT: No sore throat. Cardiovascular: Denies chest pain. Respiratory: Denies shortness of breath. Gastrointestinal: Positive abdominal pain now resolved.  No nausea, no  vomiting.  No diarrhea.  No constipation. Genitourinary: Negative for dysuria.  Positive discharge Musculoskeletal: Negative for back pain. Skin: Negative for rash. GU: Increased discharge Neurological: Negative for headaches, focal weakness or numbness. All other ROS negative ____________________________________________   PHYSICAL EXAM:  VITAL SIGNS: ED Triage Vitals  Enc Vitals Group     BP 09/04/18 0505 113/85     Pulse Rate 09/04/18 0505 85     Resp 09/04/18 0505 18     Temp 09/04/18 0505 98.3 F (36.8 C)     Temp Source 09/04/18 0505 Oral     SpO2 09/04/18 0505 97 %     Weight 09/04/18 0504 195 lb (88.5 kg)     Height 09/04/18 0504 5\' 1"  (1.549 m)     Head Circumference --      Peak Flow --      Pain Score 09/04/18 0504 5     Pain Loc --      Pain Edu? --      Excl. in GC? --     Constitutional: Asleep but awakens easily.  Alert and oriented. Well appearing and in no acute distress. Eyes: Conjunctivae are normal. EOMI. Head: Atraumatic. Nose: No congestion/rhinnorhea. Mouth/Throat: Mucous membranes are moist.   Neck: No stridor. Trachea Midline. FROM Cardiovascular: Normal rate, regular rhythm. Grossly normal heart sounds.  Good peripheral circulation. Respiratory: Normal respiratory effort.  No retractions. Lungs CTAB. Gastrointestinal: Soft and nontender. No distention. No abdominal bruits.  Musculoskeletal: No lower extremity tenderness nor edema.  No joint effusions. Neurologic:  Normal speech and language. No gross focal neurologic deficits are appreciated.  Skin:  Skin is warm, dry and intact. No rash noted. Psychiatric: Mood and affect are normal. Speech and behavior are normal. GU: No cervical motion tenderness no adnexal tenderness mild discharge  ____________________________________________   LABS (all labs ordered are listed, but only abnormal results are displayed)  Labs Reviewed  URINALYSIS, COMPLETE (UACMP) WITH MICROSCOPIC - Abnormal; Notable  for the following components:      Result Value   Color, Urine YELLOW (*)    APPearance HAZY (*)    Hgb urine dipstick LARGE (*)    Leukocytes,Ua LARGE (*)    All other components within normal limits  CBC WITH DIFFERENTIAL/PLATELET - Abnormal; Notable for the following components:   WBC 13.5 (*)    Neutro Abs 7.9 (*)    Eosinophils Absolute 1.0 (*)    All other components within normal limits  COMPREHENSIVE METABOLIC PANEL  LIPASE, BLOOD  POCT PREGNANCY, URINE   ____________________________________________  ____________________________________________   PROCEDURES  Procedure(s) performed (including Critical Care):  Procedures   ____________________________________________   INITIAL IMPRESSION / ASSESSMENT AND PLAN / ED COURSE  Robyn Barker was evaluated in Emergency Department on 09/04/2018 for the symptoms described in the history of present illness. She was evaluated in the context of the global COVID-19 pandemic, which necessitated  consideration that the patient might be at risk for infection with the SARS-CoV-2 virus that causes COVID-19. Institutional protocols and algorithms that pertain to the evaluation of patients at risk for COVID-19 are in a state of rapid change based on information released by regulatory bodies including the CDC and federal and state organizations. These policies and algorithms were followed during the patient's care in the ED.     Patient does not have any abdominal pain currently.  Very low suspicion for torsion given the description of the pain and the fact that she has had her fallopian tubes removed bilaterally.  Low suspicion for appendicitis given patient is nontender and is going on for 5 to 6 days.  Consider PID however the pelvic exam had no cervical motion tenderness or adnexal tenderness.  Will get UA to evaluate for UTI although she denies any symptoms of this.  We will also screen for STDs.  Patient has had some new sexual contacts.        UA without signs of UTI.  Patient does have large number of leukocytes but she also had 11-12 squamous cells and given no symptoms will not treat for UTI.  _White count slightly elevated at 13.5.  Pregnancy test is negative.   Pelvic exam without cervical motion tenderness or adnexal tenderness, cervix without erythema.  Some discharge in the vault..  No cervical erythema.  Discussed with patient prophylactic treatment for gonorrhea and chlamydia and given Flagyl for her bacterial vaginosis.  Patient discharged with the gonorrhea and Chlamydia test still pending.    ___________________________________________   FINAL CLINICAL IMPRESSION(S) / ED DIAGNOSES   Final diagnoses:  Bacterial vaginosis      MEDICATIONS GIVEN DURING THIS VISIT:  Medications  cefTRIAXone (ROCEPHIN) injection 250 mg (250 mg Intramuscular Given 09/04/18 0938)  azithromycin (ZITHROMAX) tablet 1,000 mg (1,000 mg Oral Given 09/04/18 [id number])     ED Discharge Orders         Ordered    metroNIDAZOLE (FLAGYL) 500 MG tablet  2 times daily     09/04/18 [id number]           Note:  This document was prepared using Dragon voice recognition software and may include unintentional dictation errors.   Concha SeFunke, Evyn Putzier E, MD 09/04/18 [phone]

## 2018-09-04 NOTE — ED Notes (Signed)
Pt lying in stretcher asleep, awaiting EDP.

## 2018-09-05 ENCOUNTER — Telehealth: Payer: Self-pay | Admitting: Emergency Medicine

## 2018-09-05 NOTE — Telephone Encounter (Signed)
Called patient to inform of positive chlamydia.  She was treated in the ED.  Just need to make sure she understands need for partner treatment.  Left message.

## 2018-09-06 ENCOUNTER — Telehealth: Payer: Self-pay | Admitting: Emergency Medicine

## 2018-11-12 ENCOUNTER — Encounter: Payer: Self-pay | Admitting: Obstetrics & Gynecology

## 2018-11-12 ENCOUNTER — Ambulatory Visit (INDEPENDENT_AMBULATORY_CARE_PROVIDER_SITE_OTHER): Payer: No Typology Code available for payment source | Admitting: Obstetrics & Gynecology

## 2018-11-12 ENCOUNTER — Other Ambulatory Visit: Payer: Self-pay

## 2018-11-12 ENCOUNTER — Other Ambulatory Visit: Payer: Self-pay | Admitting: Obstetrics & Gynecology

## 2018-11-12 VITALS — BP 136/94 | HR 94 | Ht 61.5 in | Wt 194.0 lb

## 2018-11-12 DIAGNOSIS — Z1151 Encounter for screening for human papillomavirus (HPV): Secondary | ICD-10-CM | POA: Diagnosis not present

## 2018-11-12 DIAGNOSIS — B9689 Other specified bacterial agents as the cause of diseases classified elsewhere: Secondary | ICD-10-CM | POA: Diagnosis not present

## 2018-11-12 DIAGNOSIS — Z23 Encounter for immunization: Secondary | ICD-10-CM | POA: Diagnosis not present

## 2018-11-12 DIAGNOSIS — Z124 Encounter for screening for malignant neoplasm of cervix: Secondary | ICD-10-CM | POA: Diagnosis not present

## 2018-11-12 DIAGNOSIS — Z01419 Encounter for gynecological examination (general) (routine) without abnormal findings: Secondary | ICD-10-CM | POA: Diagnosis not present

## 2018-11-12 DIAGNOSIS — Z113 Encounter for screening for infections with a predominantly sexual mode of transmission: Secondary | ICD-10-CM | POA: Diagnosis not present

## 2018-11-12 DIAGNOSIS — N760 Acute vaginitis: Secondary | ICD-10-CM | POA: Diagnosis not present

## 2018-11-12 DIAGNOSIS — Z1231 Encounter for screening mammogram for malignant neoplasm of breast: Secondary | ICD-10-CM

## 2018-11-12 DIAGNOSIS — N898 Other specified noninflammatory disorders of vagina: Secondary | ICD-10-CM | POA: Diagnosis not present

## 2018-11-12 DIAGNOSIS — E559 Vitamin D deficiency, unspecified: Secondary | ICD-10-CM

## 2018-11-12 NOTE — Progress Notes (Signed)
GYNECOLOGY ANNUAL PREVENTATIVE CARE ENCOUNTER NOTE  History:     Robyn Barker is a 43 y.o. [phone] female here for a routine annual gynecologic exam.  Current complaints: increased white non-pruritic vaginal discharge.   Denies abnormal vaginal bleeding, pelvic pain, problems with intercourse or other gynecologic concerns.    Gynecologic History Patient's last menstrual period was 11/02/2018 (exact date). Contraception: status post bilateral salpingectomy Last Pap: 2017. Results were: normal with negative HPV Last mammogram: 2017. Results were: normal  Obstetric History OB History  Gravida Para Term Preterm AB Living  5 3 3   1 3   SAB TAB Ectopic Multiple Live Births      1        # Outcome Date GA Lbr Len/2nd Weight Sex Delivery Anes PTL Lv  5 Gravida           4 Ectopic 2017          3 Term      Vag-Spont     2 Term      Vag-Spont     1 Term      Vag-Spont       Past Medical History:  Diagnosis Date  . Asthma   . Ectopic pregnancy 05/20/2016  . Genital herpes   . High cholesterol     Past Surgical History:  Procedure Laterality Date  . DIAGNOSTIC LAPAROSCOPY WITH REMOVAL OF ECTOPIC PREGNANCY N/A 05/15/2015   Procedure: DIAGNOSTIC LAPAROSCOPY WITH REMOVAL OF ECTOPIC PREGNANCY;  Surgeon: 05/17/2015, MD;  Location: [facility];  Service: Gynecology;  Laterality: N/A;  . DIAGNOSTIC LAPAROSCOPY WITH REMOVAL OF ECTOPIC PREGNANCY Bilateral 05/20/2016   Procedure: DIAGNOSTIC LAPAROSCOPY;  Surgeon: 05/22/2016, MD;  Location: [facility];  Service: Gynecology;  Laterality: Bilateral;  . ovarian cyst removed    . SALPINGECTOMY Right 05/15/2015  . UNILATERAL SALPINGECTOMY Left 05/20/2016   Procedure: UNILATERAL SALPINGECTOMY;  Surgeon: 05/22/2016, MD;  Location: [facility];  Service: Gynecology;  Laterality: Left;    Current Outpatient Medications on File Prior to Visit  Medication Sig Dispense Refill  . Cholecalciferol (VITAMIN D3) 5000 UNITS CAPS Take 5,000 Units by  mouth daily.    . fluticasone (FLONASE) 50 MCG/ACT nasal spray Place 2 sprays into both nostrils daily.    Tereso Newcomer PROAIR HFA 108 (90 Base) MCG/ACT inhaler INHALE 2 PUFFS EVERY FOUR TO SIX HOURS AS NEEDED  5  . simvastatin (ZOCOR) 20 MG tablet Take 20 mg by mouth daily.     No current facility-administered medications on file prior to visit.     No Known Allergies  Social History:  reports that she has been smoking cigarettes. She has been smoking about 0.00 packs per day for the past 0.00 years. She has never used smokeless tobacco. She reports current alcohol use. She reports previous drug use.  Family History  Problem Relation Age of Onset  . Breast cancer Maternal Aunt     The following portions of the patient's history were reviewed and updated as appropriate: allergies, current medications, past family history, past medical history, past social history, past surgical history and problem list.  Review of Systems Pertinent items noted in HPI and remainder of comprehensive ROS otherwise negative.  Physical Exam:  BP (!) 136/94   Pulse 94   Ht 5' 1.5" (1.562 m)   Wt 194 lb (88 kg)   LMP 11/02/2018 (Exact Date)   BMI 36.06 kg/m  CONSTITUTIONAL: Well-developed, well-nourished female in no acute distress.  HENT:  Normocephalic, atraumatic, External right and left ear normal. Oropharynx is clear and moist EYES: Conjunctivae and EOM are normal. Pupils are equal, round, and reactive to light. No scleral icterus.  NECK: Normal range of motion, supple, no masses.  Normal thyroid.  SKIN: Skin is warm and dry. No rash noted. Not diaphoretic. No erythema. No pallor. MUSCULOSKELETAL: Normal range of motion. No tenderness.  No cyanosis, clubbing, or edema.  2+ distal pulses. NEUROLOGIC: Alert and oriented to person, place, and time. Normal reflexes, muscle tone coordination. No cranial nerve deficit noted. PSYCHIATRIC: Normal mood and affect. Normal behavior. Normal judgment and thought content.  CARDIOVASCULAR: Normal heart rate noted, regular rhythm RESPIRATORY: Clear to auscultation bilaterally. Effort and breath sounds normal, no problems with respiration noted. BREASTS: Symmetric in size. No masses, skin changes, nipple drainage, or lymphadenopathy. ABDOMEN: Soft, normal bowel sounds, no distention noted.  No tenderness, rebound or guarding.  PELVIC: Normal appearing external genitalia; normal appearing vaginal mucosa and cervix.  No abnormal discharge noted.  Pap smear obtained.  Normal uterine size, no other palpable masses, no uterine or adnexal tenderness.   Assessment and Plan:    1. Encounter for screening mammogram for breast cancer Mammogram scheduled - MM 3D SCREEN BREAST BILATERAL; Future  2. Well woman exam with routine gynecological exam Preventative health maintenance labs done.  Will follow up results of pap smear and manage accordingly. - Hepatitis B surface antigen - Hepatitis C antibody - HIV Antibody (routine testing w rflx) - RPR - Cytology - PAP - Cervicovaginal ancillary only( [place]) - Flu Vaccine QUAD 36+ mos IM (Fluarix, Quad PF) - VITAMIN D 25 Hydroxy (Vit-D Deficiency, Fractures) - Comprehensive metabolic panel - TSH - Hemoglobin A1c - CBC - Lipid panel (nonfasting, patient understands triglycerides could be falsely elevated) Routine preventative health maintenance measures emphasized. Please refer to After Visit Summary for other counseling recommendations.      Verita Schneiders, MD, [place] for [employer], [place]

## 2018-11-12 NOTE — Patient Instructions (Signed)

## 2018-11-13 LAB — COMPREHENSIVE METABOLIC PANEL
ALT: 12 IU/L (ref 0–32)
AST: 16 IU/L (ref 0–40)
Albumin/Globulin Ratio: 1.6 (ref 1.2–2.2)
Albumin: 4.4 g/dL (ref 3.8–4.8)
Alkaline Phosphatase: 57 IU/L (ref 39–117)
BUN/Creatinine Ratio: 12 (ref 9–23)
BUN: 11 mg/dL (ref 6–24)
Bilirubin Total: <0.2 mg/dL (ref 0.0–1.2)
CO2: 22 mmol/L (ref 20–29)
Calcium: 9.5 mg/dL (ref 8.7–10.2)
Chloride: 104 mmol/L (ref 96–106)
Creatinine, Ser: 0.91 mg/dL (ref 0.57–1.00)
GFR calc Af Amer: 90 mL/min/{1.73_m2} (ref >59)
GFR calc non Af Amer: 78 mL/min/{1.73_m2} (ref >59)
Globulin, Total: 2.7 g/dL (ref 1.5–4.5)
Glucose: 89 mg/dL (ref 65–99)
Potassium: 4.2 mmol/L (ref 3.5–5.2)
Sodium: 139 mmol/L (ref 134–144)
Total Protein: 7.1 g/dL (ref 6.0–8.5)

## 2018-11-13 LAB — HEPATITIS C ANTIBODY: Hep C Virus Ab: <0.1 s/co ratio (ref 0.0–0.9)

## 2018-11-13 LAB — HEMOGLOBIN A1C
Est. average glucose Bld gHb Est-mCnc: 126 mg/dL
Hgb A1c MFr Bld: 6 % — ABNORMAL HIGH (ref 4.8–5.6)

## 2018-11-13 LAB — HEPATITIS B SURFACE ANTIGEN: Hepatitis B Surface Ag: NEGATIVE

## 2018-11-13 LAB — LIPID PANEL
Chol/HDL Ratio: 5.4 ratio — ABNORMAL HIGH (ref 0.0–4.4)
Cholesterol, Total: 162 mg/dL (ref 100–199)
HDL: 30 mg/dL — ABNORMAL LOW (ref >39)
LDL Chol Calc (NIH): 78 mg/dL (ref 0–99)
Triglycerides: 331 mg/dL — ABNORMAL HIGH (ref 0–149)
VLDL Cholesterol Cal: 54 mg/dL — ABNORMAL HIGH (ref 5–40)

## 2018-11-13 LAB — RPR: RPR Ser Ql: NONREACTIVE

## 2018-11-13 LAB — HIV ANTIBODY (ROUTINE TESTING W REFLEX): HIV Screen 4th Generation wRfx: NONREACTIVE

## 2018-11-13 LAB — CBC
Hematocrit: 39.6 % (ref 34.0–46.6)
Hemoglobin: 13.2 g/dL (ref 11.1–15.9)
MCH: 29.3 pg (ref 26.6–33.0)
MCHC: 33.3 g/dL (ref 31.5–35.7)
MCV: 88 fL (ref 79–97)
Platelets: 329 10*3/uL (ref 150–450)
RBC: 4.5 x10E6/uL (ref 3.77–5.28)
RDW: 13 % (ref 11.7–15.4)
WBC: 11.4 10*3/uL — ABNORMAL HIGH (ref 3.4–10.8)

## 2018-11-13 LAB — VITAMIN D 25 HYDROXY (VIT D DEFICIENCY, FRACTURES): Vit D, 25-Hydroxy: 24 ng/mL — ABNORMAL LOW (ref 30.0–100.0)

## 2018-11-13 LAB — TSH: TSH: 2.66 u[IU]/mL (ref 0.450–4.500)

## 2018-11-14 LAB — CYTOLOGY - PAP
Diagnosis: NEGATIVE
High risk HPV: NEGATIVE
Molecular Disclaimer: 56
Molecular Disclaimer: DETECTED
Molecular Disclaimer: NORMAL

## 2018-11-14 LAB — CERVICOVAGINAL ANCILLARY ONLY
Bacterial Vaginitis (gardnerella): POSITIVE — AB
Candida Glabrata: NEGATIVE
Candida Vaginitis: NEGATIVE
Molecular Disclaimer: NEGATIVE
Molecular Disclaimer: NEGATIVE
Molecular Disclaimer: NEGATIVE
Molecular Disclaimer: NORMAL
Trichomonas: NEGATIVE

## 2018-11-14 MED ORDER — VITAMIN D3 125 MCG (5000 UT) PO CAPS: 5000.0000 [IU] | ORAL_CAPSULE | Freq: Every day | ORAL | 95 refills | Status: DC

## 2018-11-14 NOTE — Addendum Note (Signed)
Addended by: Verita Schneiders A on: 11/14/2018 01:21 AM   Modules accepted: Orders

## 2018-11-15 ENCOUNTER — Telehealth: Payer: Self-pay | Admitting: *Deleted

## 2018-11-15 LAB — CERVICOVAGINAL ANCILLARY ONLY
Chlamydia: NEGATIVE
Neisseria Gonorrhea: NEGATIVE

## 2018-11-15 MED ORDER — METRONIDAZOLE 500 MG PO TABS: 500.0000 mg | ORAL_TABLET | Freq: Two times a day (BID) | ORAL | 0 refills | Status: DC

## 2018-11-15 NOTE — Telephone Encounter (Signed)
Pt informed of results and would like meds sent in for BV. RX sent to [facility].

## 2018-11-22 ENCOUNTER — Ambulatory Visit: Payer: No Typology Code available for payment source | Admitting: Family Medicine

## 2018-11-22 DIAGNOSIS — Z0289 Encounter for other administrative examinations: Secondary | ICD-10-CM

## 2019-03-10 IMAGING — US US OB TRANSVAGINAL
1 series · 13 of 28 positions shown · non-contrast
Comparison: None for this pregnancy

CLINICAL DATA: 40 -year-old female with positive urine pregnancy
test presenting with pelvic pain. History of ectopic pregnancy with
left salpingectomy. LMP: 04/11/2016 corresponding to an estimated
gestational age of 4 weeks, 1 day

EXAM:
OBSTETRIC <14 WK ULTRASOUND
TECHNIQUE: Transabdominal ultrasound was performed for evaluation of the
gestation as well as the maternal uterus and adnexal regions.

[Series 1: us ob transvaginal · 0.18mm/px · 13 of 67 slices shown]
[im 3/67]
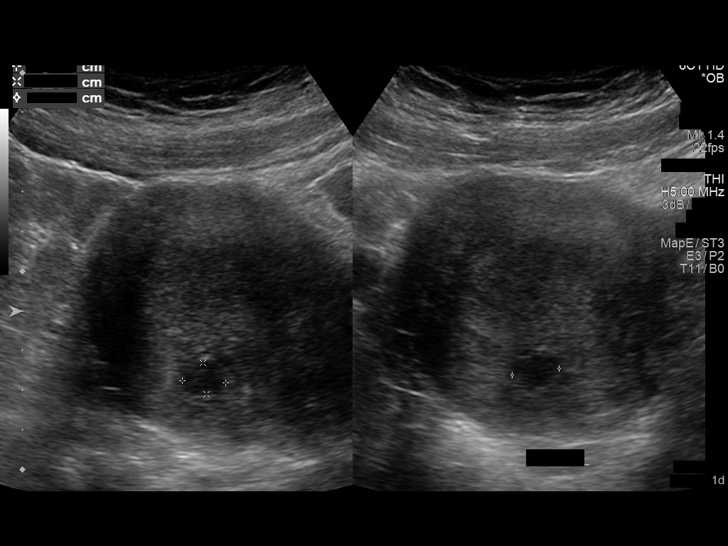
[im 8/67]
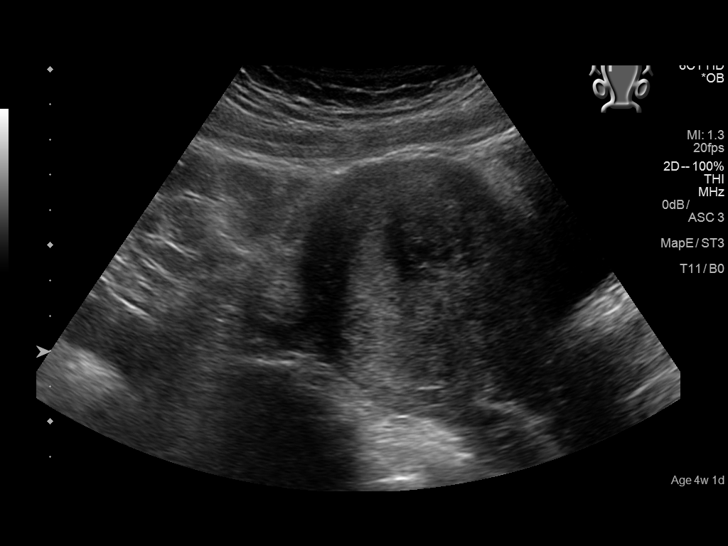
[im 13/67]
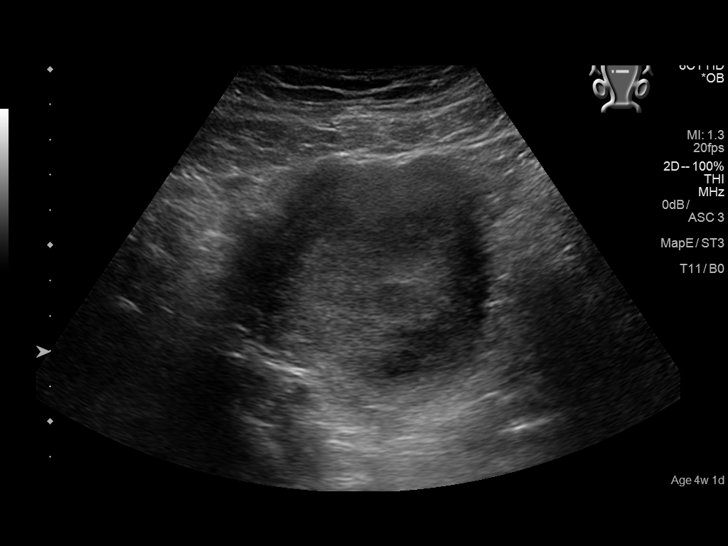
[im 18/67]
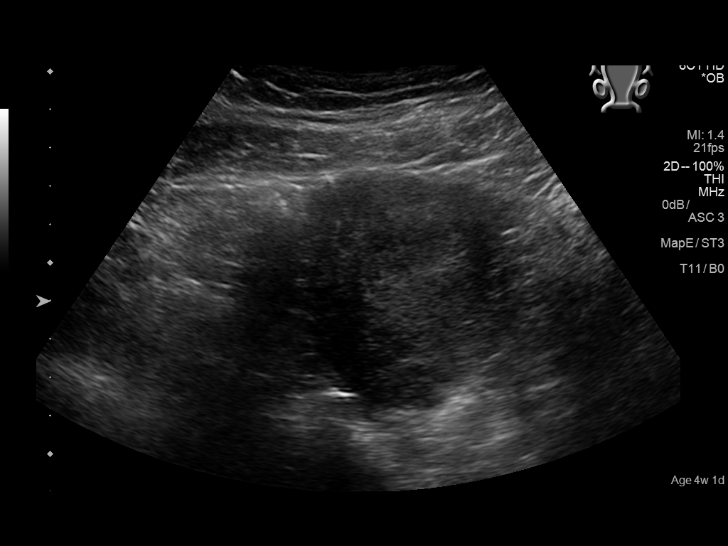
[im 23/67]
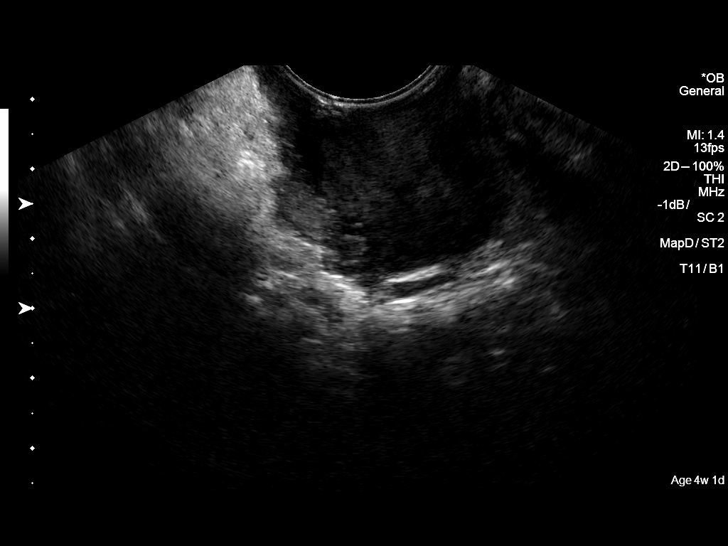
[im 27/67]
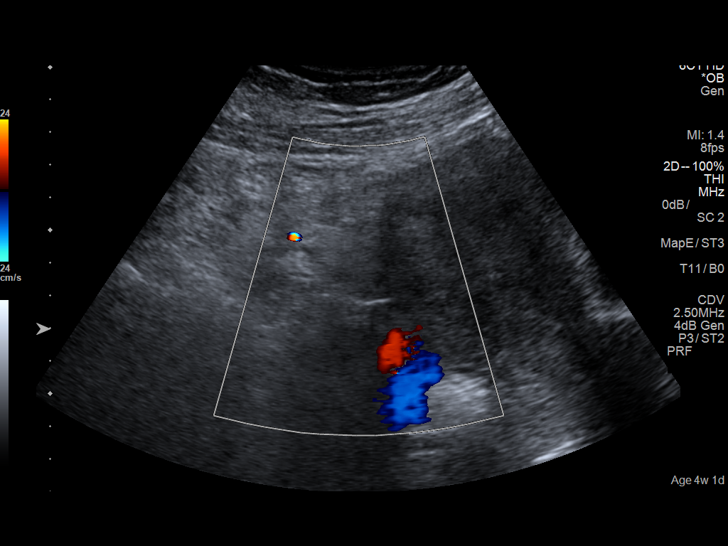
[im 35/67]
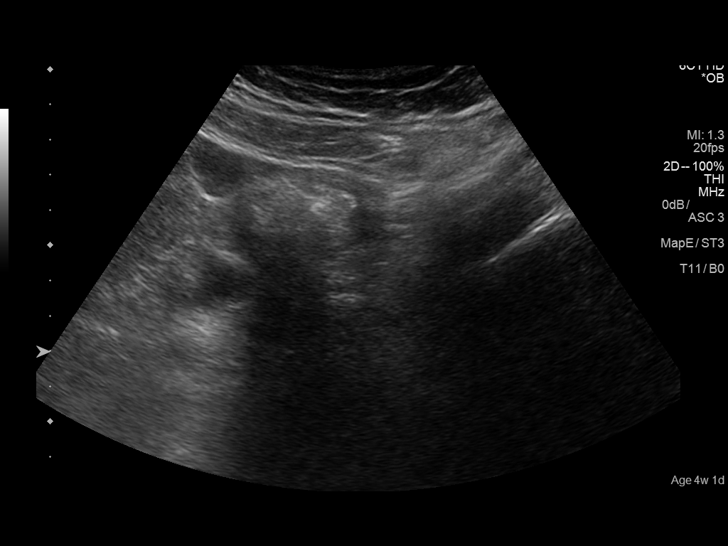
[im 40/67]
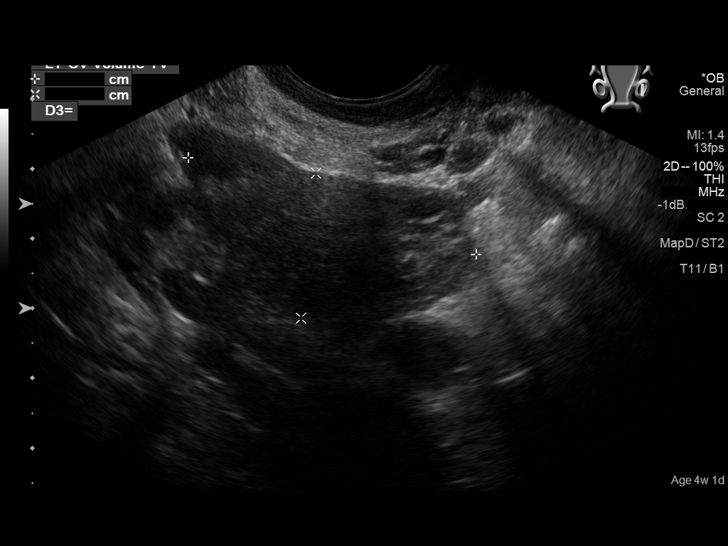
[im 45/67]
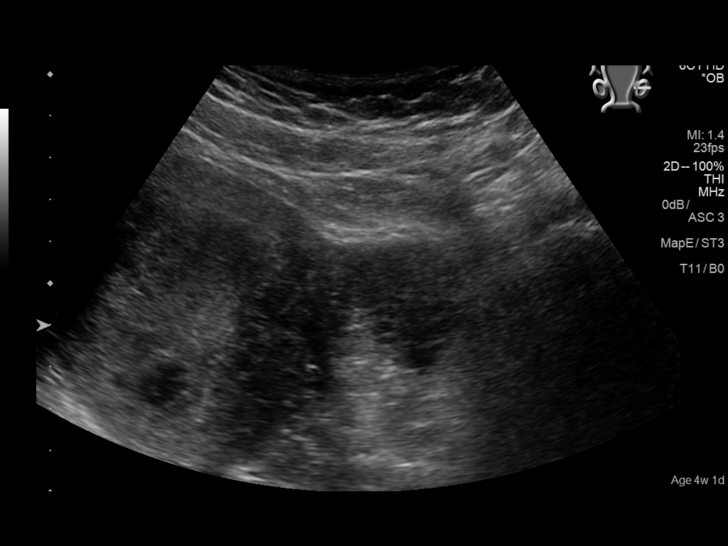
[im 49/67]
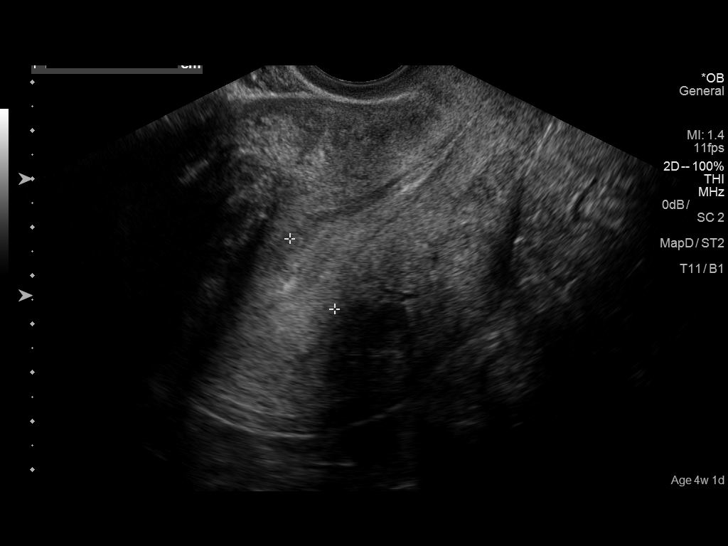
[im 54/67]
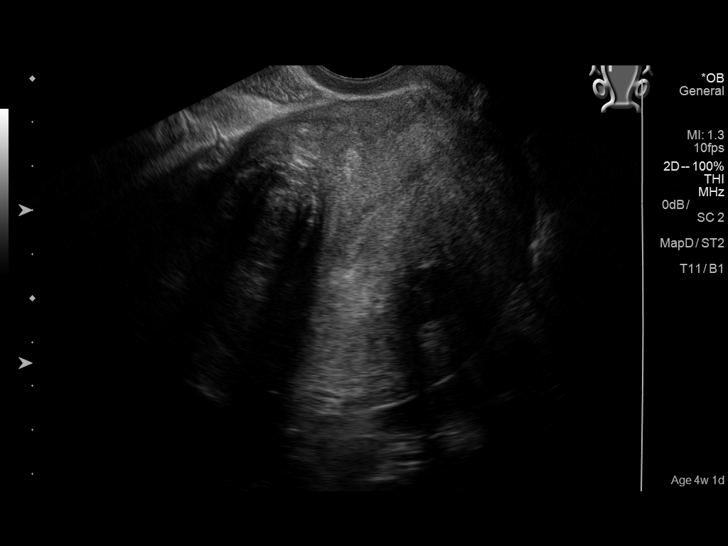
[im 59/67]
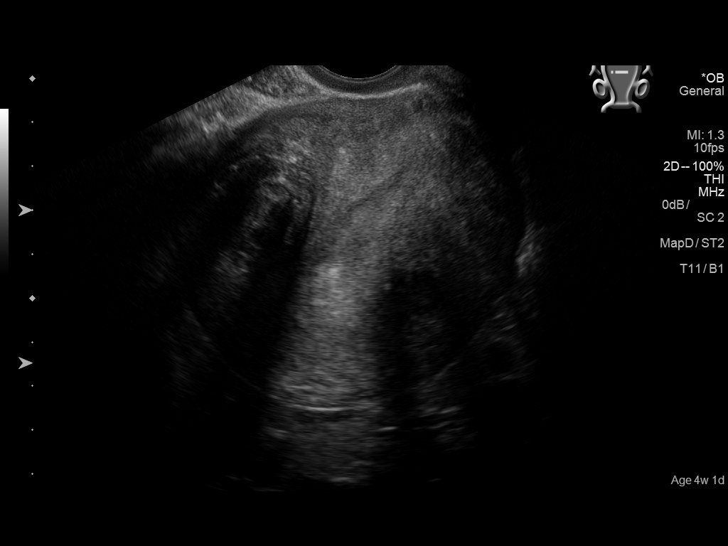
[im 64/67]
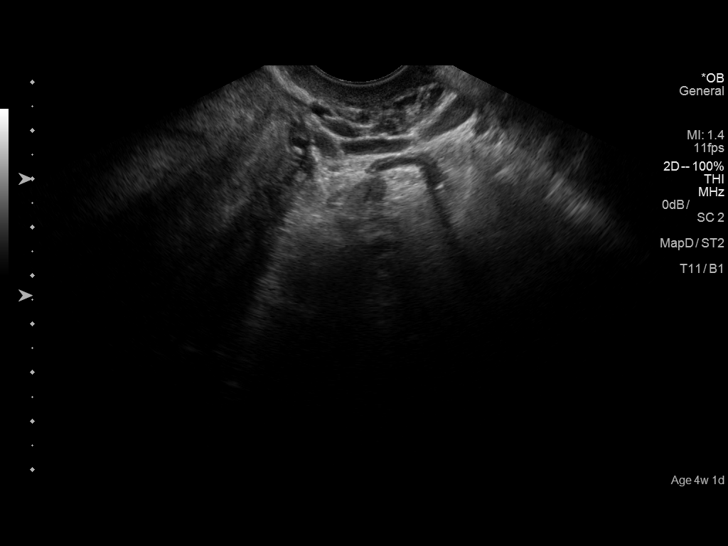

[13 of 28 positions shown; findings below may reference images not displayed]

FINDINGS: The uterus is anteverted and heterogeneous. There are 2 intramural
fibroids with the largest measuring 2.6 x 2.4 x 2.4 cm. The
endometrium is thickened measuring 17 mm. No intrauterine pregnancy
identified. No abnormal endometrial vascularity noted. Findings may
represent an early pregnancy, recent spontaneous abortion, or an
ectopic pregnancy. Correlation with clinical exam and follow-up with
serial HCG levels and ultrasound recommended.

The left ovary measures 4.4 x 2.1 x 2.1 cm for a volume of 10 cc.
There is slight prominence of the left ovary on 1 image (41/69). No
discrete mass identified. However if there is clinical concern for
ectopic pregnancy in the region of the left ovary further evaluation
with repeat ultrasound and compression in the region of the left
ovary by ultrasound probe recommended to exclude the possibility of
a para ovarian mass/ectopic. The right ovary measures 4.1 x 2.0 x
2.8 cm for a volume of 12 cc.

No free fluid noted within the pelvis.
IMPRESSION: 1. No intrauterine pregnancy identified. Differentials include early
pregnancy, recent spontaneous abortion, or possible ectopic
pregnancy. Correlation with clinical exam and follow-up with serial
HCG levels and ultrasound recommended.
2. Somewhat prominent appearance of the left ovary seen on 1 image.
If there is clinical concern for ectopic pregnancy in the region of
the left ovary further evaluation of the left ovary with ultrasound
and compression technique is recommended.
3. No free fluid within the pelvis.
4. Small uterine fibroids.

## 2019-03-14 ENCOUNTER — Other Ambulatory Visit: Payer: Self-pay

## 2019-03-14 ENCOUNTER — Ambulatory Visit
Admission: RE | Admit: 2019-03-14 | Discharge: 2019-03-14 | Disposition: A | Discharge status: Home / Self Care | Payer: No Typology Code available for payment source | Source: Ambulatory Visit | Attending: Obstetrics & Gynecology | Admitting: Obstetrics & Gynecology

## 2019-03-14 DIAGNOSIS — Z1231 Encounter for screening mammogram for malignant neoplasm of breast: Secondary | ICD-10-CM | POA: Diagnosis not present

## 2019-05-22 ENCOUNTER — Emergency Department
Admission: EM | Admit: 2019-05-22 | Discharge: 2019-05-22 | Disposition: A | Discharge status: Home / Self Care | Payer: PRIVATE HEALTH INSURANCE | Attending: Emergency Medicine | Admitting: Emergency Medicine

## 2019-05-22 ENCOUNTER — Emergency Department: Payer: PRIVATE HEALTH INSURANCE

## 2019-05-22 ENCOUNTER — Other Ambulatory Visit: Payer: Self-pay

## 2019-05-22 DIAGNOSIS — S39012D Strain of muscle, fascia and tendon of lower back, subsequent encounter: Secondary | ICD-10-CM | POA: Diagnosis not present

## 2019-05-22 DIAGNOSIS — S39012A Strain of muscle, fascia and tendon of lower back, initial encounter: Secondary | ICD-10-CM

## 2019-05-22 DIAGNOSIS — S161XXD Strain of muscle, fascia and tendon at neck level, subsequent encounter: Secondary | ICD-10-CM | POA: Insufficient documentation

## 2019-05-22 DIAGNOSIS — F17210 Nicotine dependence, cigarettes, uncomplicated: Secondary | ICD-10-CM | POA: Insufficient documentation

## 2019-05-22 DIAGNOSIS — S161XXA Strain of muscle, fascia and tendon at neck level, initial encounter: Secondary | ICD-10-CM

## 2019-05-22 LAB — POC URINE PREG, ED: Preg Test, Ur: NEGATIVE

## 2019-05-22 MED ORDER — BACLOFEN 10 MG PO TABS: 10.0000 mg | ORAL_TABLET | Freq: Every day | ORAL | 1 refills | Status: DC

## 2019-05-22 MED ORDER — PREDNISONE 10 MG (21) PO TBPK: ORAL_TABLET | ORAL | 0 refills | Status: DC
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
## 2019-05-22 NOTE — Discharge Instructions (Signed)
Follow-up with your regular doctor or orthopedics.  Apply wet heat followed by ice.  Stop taking Valium and use baclofen in the Sterapred.  Do not take NSAIDs such as ibuprofen, Naprosyn or meloxicam while taking the Sterapred.  You may also take Tylenol.  Return to the emergency department if worsening.
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
## 2019-06-03 ENCOUNTER — Ambulatory Visit (INDEPENDENT_AMBULATORY_CARE_PROVIDER_SITE_OTHER): Payer: No Typology Code available for payment source | Admitting: Family Medicine

## 2019-06-03 ENCOUNTER — Encounter: Payer: Self-pay | Admitting: Family Medicine

## 2019-06-03 ENCOUNTER — Other Ambulatory Visit: Payer: Self-pay

## 2019-06-03 VITALS — BP 145/84 | HR 105 | Wt 197.0 lb

## 2019-06-03 DIAGNOSIS — Z113 Encounter for screening for infections with a predominantly sexual mode of transmission: Secondary | ICD-10-CM | POA: Diagnosis not present

## 2019-06-03 DIAGNOSIS — N760 Acute vaginitis: Secondary | ICD-10-CM | POA: Diagnosis not present

## 2019-06-03 NOTE — Progress Notes (Signed)
   Subjective:    Patient ID: Robyn Barker is a 44 y.o. female presenting with Vaginal Discharge  on 06/03/2019  HPI: Having recurrent vaginal infections. Notes they are there once in a while.Had odor with her cycle. No irritation. Heavier discharge. No abd. Pain, no N/V no fever. Does take tub baths.  Review of Systems  Constitutional: Negative for chills and fever.  Respiratory: Negative for shortness of breath.   Cardiovascular: Negative for chest pain.  Gastrointestinal: Negative for abdominal pain, nausea and vomiting.  Genitourinary: Negative for dysuria.  Skin: Negative for rash.      Objective:    BP (!) 145/84   Pulse (!) 105   Wt 197 lb (89.4 kg)   LMP 05/25/2019 (Exact Date)   BMI 37.22 kg/m  Physical Exam Constitutional:      General: She is not in acute distress.    Appearance: She is well-developed.  HENT:     Head: Normocephalic and atraumatic.  Eyes:     General: No scleral icterus. Cardiovascular:     Rate and Rhythm: Normal rate.  Pulmonary:     Effort: Pulmonary effort is normal.  Abdominal:     Palpations: Abdomen is soft.  Musculoskeletal:     Cervical back: Neck supple.  Skin:    General: Skin is warm and dry.  Neurological:     Mental Status: She is alert and oriented to person, place, and time.         Assessment & Plan:   Problem List Items Addressed This Visit      Unprioritized   Recurrent vaginitis    Check wet prep--advised no baths, other reasons BV continues.       Other Visit Diagnoses    Screen for STD (sexually transmitted disease)    -  Primary   Relevant Orders   Cervicovaginal ancillary only( [place])   Hepatitis B surface antigen (Completed)   Hepatitis C antibody (Completed)   HIV Antibody (routine testing w rflx) (Completed)   RPR (Completed)      Total time: 20 minutes.  Return if symptoms worsen or fail to improve.  Reva Bores 06/04/2019 5:42 PM

## 2019-06-04 ENCOUNTER — Encounter: Payer: Self-pay | Admitting: Family Medicine

## 2019-06-04 DIAGNOSIS — N760 Acute vaginitis: Secondary | ICD-10-CM | POA: Insufficient documentation

## 2019-06-04 LAB — HEPATITIS C ANTIBODY: Hep C Virus Ab: <0.1 s/co ratio (ref 0.0–0.9)

## 2019-06-04 LAB — HEPATITIS B SURFACE ANTIGEN: Hepatitis B Surface Ag: NEGATIVE

## 2019-06-04 LAB — RPR: RPR Ser Ql: NONREACTIVE

## 2019-06-04 LAB — HIV ANTIBODY (ROUTINE TESTING W REFLEX): HIV Screen 4th Generation wRfx: NONREACTIVE

## 2019-06-04 NOTE — Assessment & Plan Note (Signed)
Check wet prep--advised no baths, other reasons BV continues.

## 2019-06-05 ENCOUNTER — Other Ambulatory Visit: Payer: Self-pay | Admitting: Family Medicine

## 2019-06-05 LAB — CERVICOVAGINAL ANCILLARY ONLY
Bacterial Vaginitis (gardnerella): POSITIVE — AB
Candida Glabrata: NEGATIVE
Candida Vaginitis: POSITIVE — AB
Chlamydia: NEGATIVE
Comment: NEGATIVE
Comment: NEGATIVE
Comment: NEGATIVE
Comment: NEGATIVE
Comment: NEGATIVE
Comment: NORMAL
Neisseria Gonorrhea: NEGATIVE
Trichomonas: NEGATIVE

## 2019-06-05 MED ORDER — FLUCONAZOLE 150 MG PO TABS: 150.0000 mg | ORAL_TABLET | Freq: Every day | ORAL | 2 refills | Status: DC

## 2019-06-05 MED ORDER — METRONIDAZOLE 500 MG PO TABS: 500.0000 mg | ORAL_TABLET | Freq: Two times a day (BID) | ORAL | 0 refills | Status: DC

## 2019-06-13 ENCOUNTER — Encounter: Payer: Self-pay | Admitting: Family Medicine

## 2019-06-13 ENCOUNTER — Ambulatory Visit (INDEPENDENT_AMBULATORY_CARE_PROVIDER_SITE_OTHER): Payer: No Typology Code available for payment source | Admitting: Family Medicine

## 2019-06-13 ENCOUNTER — Other Ambulatory Visit: Payer: Self-pay

## 2019-06-13 VITALS — BP 110/68 | HR 72 | Temp 98.2°F | Ht 61.0 in | Wt 198.2 lb

## 2019-06-13 DIAGNOSIS — E559 Vitamin D deficiency, unspecified: Secondary | ICD-10-CM

## 2019-06-13 DIAGNOSIS — Z6837 Body mass index (BMI) 37.0-37.9, adult: Secondary | ICD-10-CM

## 2019-06-13 DIAGNOSIS — Z7689 Persons encountering health services in other specified circumstances: Secondary | ICD-10-CM

## 2019-06-13 DIAGNOSIS — Z720 Tobacco use: Secondary | ICD-10-CM

## 2019-06-13 DIAGNOSIS — Z23 Encounter for immunization: Secondary | ICD-10-CM

## 2019-06-13 DIAGNOSIS — M25512 Pain in left shoulder: Secondary | ICD-10-CM

## 2019-06-13 DIAGNOSIS — Z87891 Personal history of nicotine dependence: Secondary | ICD-10-CM | POA: Insufficient documentation

## 2019-06-13 DIAGNOSIS — G4700 Insomnia, unspecified: Secondary | ICD-10-CM | POA: Diagnosis not present

## 2019-06-13 DIAGNOSIS — R7303 Prediabetes: Secondary | ICD-10-CM | POA: Insufficient documentation

## 2019-06-13 DIAGNOSIS — G8929 Other chronic pain: Secondary | ICD-10-CM

## 2019-06-13 DIAGNOSIS — E661 Drug-induced obesity: Secondary | ICD-10-CM | POA: Insufficient documentation

## 2019-06-13 MED ORDER — AMITRIPTYLINE HCL 25 MG PO TABS: 25.0000 mg | ORAL_TABLET | Freq: Every day | ORAL | 5 refills | Status: DC

## 2019-06-13 NOTE — Patient Instructions (Addendum)
Good to see you today Please schedule your complete physical for 3-4 months Consider stopping smoking  Your blood sugar is in the prediabetic range. I recommend you reduce your sugars, starches, fast food and processed food. Eat lots of vegetables (especially non starchy), meats, fruits and whole grains like brown rice, quinoa.   Steps to Quit Smoking Smoking tobacco is the leading cause of preventable death. It can affect almost every organ in the body. Smoking puts you and those around you at risk for developing many serious chronic diseases. Quitting smoking can be difficult, but it is one of the best things that you can do for your health. It is never too late to quit. How do I get ready to quit? When you decide to quit smoking, create a plan to help you succeed. Before you quit:  Pick a date to quit. Set a date within the next 2 weeks to give you time to prepare.  Write down the reasons why you are quitting. Keep this list in places where you will see it often.  Tell your family, friends, and co-workers that you are quitting. Support from your loved ones can make quitting easier.  Talk with your health care provider about your options for quitting smoking.  Find out what treatment options are covered by your health insurance.  Identify people, places, things, and activities that make you want to smoke (triggers). Avoid them. What first steps can I take to quit smoking?  Throw away all cigarettes at home, at work, and in your car.  Throw away smoking accessories, such as Scientist, research (medical).  Clean your car. Make sure to empty the ashtray.  Clean your home, including curtains and carpets. What strategies can I use to quit smoking? Talk with your health care provider about combining strategies, such as taking medicines while you are also receiving in-person counseling. Using these two strategies together makes you more likely to succeed in quitting than if you used either  strategy on its own.  If you are pregnant or breastfeeding, talk with your health care provider about finding counseling or other support strategies to quit smoking. Do not take medicine to help you quit smoking unless your health care provider tells you to do so. To quit smoking: Quit right away  Quit smoking completely, instead of gradually reducing how much you smoke over a period of time. Research shows that stopping smoking right away is more successful than gradually quitting.  Attend in-person counseling to help you build problem-solving skills. You are more likely to succeed in quitting if you attend counseling sessions regularly. Even short sessions of 10 minutes can be effective. Take medicine You may take medicines to help you quit smoking. Some medicines require a prescription and some you can purchase over-the-counter. Medicines may have nicotine in them to replace the nicotine in cigarettes. Medicines may:  Help to stop cravings.  Help to relieve withdrawal symptoms. Your health care provider may recommend:  Nicotine patches, gum, or lozenges.  Nicotine inhalers or sprays.  Non-nicotine medicine that is taken by mouth. Find resources Find resources and support systems that can help you to quit smoking and remain smoke-free after you quit. These resources are most helpful when you use them often. They include:  Online chats with a Social worker.  Telephone quitlines.  Printed Furniture conservator/restorer.  Support groups or group counseling.  Text messaging programs.  Mobile phone apps or applications. Use apps that can help you stick to your quit plan by  providing reminders, tips, and encouragement. There are many free apps for mobile devices as well as websites. Examples include Quit Guide from the [employer] and smokefree.gov What things can I do to make it easier to quit?   Reach out to your family and friends for support and encouragement. Call telephone quitlines  (1-800-QUIT-NOW), reach out to support groups, or work with a counselor for support.  Ask people who smoke to avoid smoking around you.  Avoid places that trigger you to smoke, such as bars, parties, or smoke-break areas at work.  Spend time with people who do not smoke.  Lessen the stress in your life. Stress can be a smoking trigger for some people. To lessen stress, try: ? Exercising regularly. ? Doing deep-breathing exercises. ? Doing yoga. ? Meditating. ? Performing a body scan. This involves closing your eyes, scanning your body from head to toe, and noticing which parts of your body are particularly tense. Try to relax the muscles in those areas. How will I feel when I quit smoking? Day 1 to 3 weeks Within the first 24 hours of quitting smoking, you may start to feel withdrawal symptoms. These symptoms are usually most noticeable 2-3 days after quitting, but they usually do not last for more than 2-3 weeks. You may experience these symptoms:  Mood swings.  Restlessness, anxiety, or irritability.  Trouble concentrating.  Dizziness.  Strong cravings for sugary foods and nicotine.  Mild weight gain.  Constipation.  Nausea.  Coughing or a sore throat.  Changes in how the medicines that you take for unrelated issues work in your body.  Depression.  Trouble sleeping (insomnia). Week 3 and afterward After the first 2-3 weeks of quitting, you may start to notice more positive results, such as:  Improved sense of smell and taste.  Decreased coughing and sore throat.  Slower heart rate.  Lower blood pressure.  Clearer skin.  The ability to breathe more easily.  Fewer sick days. Quitting smoking can be very challenging. Do not get discouraged if you are not successful the first time. Some people need to make many attempts to quit before they achieve long-term success. Do your best to stick to your quit plan, and talk with your health care provider if you have any  questions or concerns. Summary  Smoking tobacco is the leading cause of preventable death. Quitting smoking is one of the best things that you can do for your health.  When you decide to quit smoking, create a plan to help you succeed.  Quit smoking right away, not slowly over a period of time.  When you start quitting, seek help from your health care provider, family, or friends. This information is not intended to replace advice given to you by your health care provider. Make sure you discuss any questions you have with your health care provider. Document Revised: 11/01/2018 Document Reviewed: 04/27/2018 Elsevier Patient Education  2020 [employer].
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
## 2019-09-07 ENCOUNTER — Other Ambulatory Visit: Payer: Self-pay | Admitting: Family Medicine

## 2019-09-07 DIAGNOSIS — R7303 Prediabetes: Secondary | ICD-10-CM

## 2019-09-07 DIAGNOSIS — E559 Vitamin D deficiency, unspecified: Secondary | ICD-10-CM

## 2019-09-07 DIAGNOSIS — E782 Mixed hyperlipidemia: Secondary | ICD-10-CM

## 2019-09-08 ENCOUNTER — Other Ambulatory Visit: Payer: Self-pay

## 2019-09-08 ENCOUNTER — Other Ambulatory Visit (INDEPENDENT_AMBULATORY_CARE_PROVIDER_SITE_OTHER): Payer: No Typology Code available for payment source

## 2019-09-08 ENCOUNTER — Encounter: Payer: Self-pay | Admitting: Radiology

## 2019-09-08 DIAGNOSIS — E559 Vitamin D deficiency, unspecified: Secondary | ICD-10-CM

## 2019-09-08 DIAGNOSIS — E782 Mixed hyperlipidemia: Secondary | ICD-10-CM | POA: Diagnosis not present

## 2019-09-08 DIAGNOSIS — R7303 Prediabetes: Secondary | ICD-10-CM

## 2019-09-08 LAB — LIPID PANEL
Cholesterol: 140 mg/dL (ref 0–200)
HDL: 28 mg/dL — ABNORMAL LOW (ref >39.00)
NonHDL: 111.73
Total CHOL/HDL Ratio: 5
Triglycerides: 223 mg/dL — ABNORMAL HIGH (ref 0.0–149.0)
VLDL: 44.6 mg/dL — ABNORMAL HIGH (ref 0.0–40.0)

## 2019-09-08 LAB — LDL CHOLESTEROL, DIRECT: Direct LDL: 75 mg/dL

## 2019-09-08 LAB — VITAMIN D 25 HYDROXY (VIT D DEFICIENCY, FRACTURES): VITD: 25.74 ng/mL — ABNORMAL LOW (ref 30.00–100.00)

## 2019-09-08 LAB — HEMOGLOBIN A1C: Hgb A1c MFr Bld: 6.6 % — ABNORMAL HIGH (ref 4.6–6.5)

## 2019-09-15 ENCOUNTER — Encounter: Payer: No Typology Code available for payment source | Admitting: Family Medicine

## 2019-09-22 ENCOUNTER — Other Ambulatory Visit: Payer: Self-pay

## 2019-09-22 ENCOUNTER — Encounter: Payer: Self-pay | Admitting: Family Medicine

## 2019-09-22 ENCOUNTER — Ambulatory Visit (INDEPENDENT_AMBULATORY_CARE_PROVIDER_SITE_OTHER): Payer: No Typology Code available for payment source | Admitting: Family Medicine

## 2019-09-22 VITALS — BP 100/64 | HR 100 | Temp 98.0°F | Ht 61.5 in | Wt 187.8 lb

## 2019-09-22 DIAGNOSIS — B351 Tinea unguium: Secondary | ICD-10-CM

## 2019-09-22 DIAGNOSIS — Z2821 Immunization not carried out because of patient refusal: Secondary | ICD-10-CM

## 2019-09-22 DIAGNOSIS — E1169 Type 2 diabetes mellitus with other specified complication: Secondary | ICD-10-CM | POA: Diagnosis not present

## 2019-09-22 DIAGNOSIS — E119 Type 2 diabetes mellitus without complications: Secondary | ICD-10-CM | POA: Diagnosis not present

## 2019-09-22 DIAGNOSIS — Z0000 Encounter for general adult medical examination without abnormal findings: Secondary | ICD-10-CM

## 2019-09-22 DIAGNOSIS — B372 Candidiasis of skin and nail: Secondary | ICD-10-CM

## 2019-09-22 MED ORDER — ATORVASTATIN CALCIUM 20 MG PO TABS: 20.0000 mg | ORAL_TABLET | Freq: Every day | ORAL | 3 refills | Status: DC

## 2019-09-22 MED ORDER — BLOOD GLUCOSE MONITOR KIT: PACK | 0 refills | Status: DC

## 2019-09-22 MED ORDER — METFORMIN HCL ER 500 MG PO TB24: 500.0000 mg | ORAL_TABLET | Freq: Every day | ORAL | 3 refills | Status: DC

## 2019-09-22 MED ORDER — TERBINAFINE HCL 250 MG PO TABS: 250.0000 mg | ORAL_TABLET | Freq: Every day | ORAL | 0 refills | Status: DC

## 2019-09-22 NOTE — Progress Notes (Signed)
Subjective:    Patient ID: Robyn Barker, female    DOB: 1975/04/27, 44 y.o.   MRN: [id number]  HPI This is a 44 yo female who presents today for CPE.     Last CPE- 7/20 Mammo- 03/14/19 Pap- 11/12/18 Colonoscopy- NA Covid vaccine- is considering, concerned about possible side effects Tdap-06/13/19 Flu- annual Exercise- has started walking  Insomnia- improved on low dose amitriptyline (shoulder pain also improved)  Diet- I sent her a message regarding her labs a couple of weeks ago, new onset diabetes, since that time, she has increased her water intake. Watching her diet, cutting carbs.    Review of Systems  Constitutional: Negative.   HENT: Negative.   Eyes: Negative.   Respiratory: Negative.   Cardiovascular: Negative.   Gastrointestinal: Negative.   Endocrine: Negative.   Genitourinary: Negative.   Musculoskeletal:       Shoulder pain improving.   Skin: Positive for rash (under breasts, has been using nystatin cream iwth improvment).       History of toenail fungus, did not complete course of medication several years ago. "It had a bad taste."   Allergic/Immunologic: Negative.   Neurological: Negative.   Hematological: Negative.   Psychiatric/Behavioral: Positive for sleep disturbance (sleeps well when she takes low dose amitriptyline). Negative for dysphoric mood. The patient is not nervous/anxious.        Objective:   Physical Exam Vitals reviewed.  Constitutional:      General: She is not in acute distress.    Appearance: Normal appearance. She is obese. She is not ill-appearing, toxic-appearing or diaphoretic.  HENT:     Head: Normocephalic and atraumatic.     Right Ear: Tympanic membrane, ear canal and external ear normal.     Left Ear: Tympanic membrane, ear canal and external ear normal.  Eyes:     Conjunctiva/sclera: Conjunctivae normal.  Cardiovascular:     Rate and Rhythm: Normal rate and regular rhythm.     Heart sounds: Normal heart sounds.    Pulmonary:     Effort: Pulmonary effort is normal.     Breath sounds: Normal breath sounds.  Chest:     Breasts:        Right: Skin change present. No swelling, bleeding, inverted nipple, mass, nipple discharge or tenderness.        Left: Skin change present. No swelling, bleeding, inverted nipple, mass, nipple discharge or tenderness.     Comments: Hyperpigmentation under both breasts. Small areas excoriation.  Abdominal:     General: Abdomen is flat. Bowel sounds are normal.     Palpations: Abdomen is soft.  Musculoskeletal:     Cervical back: Normal range of motion and neck supple.     Right lower leg: No edema.     Left lower leg: No edema.  Lymphadenopathy:     Upper Body:     Right upper body: No supraclavicular, axillary or pectoral adenopathy.     Left upper body: No supraclavicular, axillary or pectoral adenopathy.  Skin:    General: Skin is warm and dry.     Comments: Bilateral great toenails thickened and discolored.   Neurological:     Mental Status: She is alert and oriented to person, place, and time.  Psychiatric:        Mood and Affect: Mood normal.        Behavior: Behavior normal.        Thought Content: Thought content normal.  Judgment: Judgment normal.     BP (!) 100/64   Pulse 100   Temp 98 F (36.7 C) (Temporal)   Ht 5' 1.5" (1.562 m)   Wt 187 lb 12 oz (85.2 kg)   LMP 09/16/2019   SpO2 97%   Breastfeeding No   BMI 34.90 kg/m  Wt Readings from Last 3 Encounters:  09/22/19 187 lb 12 oz (85.2 kg)  06/13/19 198 lb 3.2 oz (89.9 kg)  06/03/19 197 lb (89.4 kg)   Depression screen [facility] 2/9 09/22/2019 06/13/2019  Decreased Interest 2 1  Down, Depressed, Hopeless 0 1  PHQ - 2 Score 2 2  Altered sleeping 1 3  Tired, decreased energy 1 2  Change in appetite 1 0  Feeling bad or failure about yourself  0 1  Trouble concentrating 0 0  Moving slowly or fidgety/restless 0 1  Suicidal thoughts 0 0  PHQ-9 Score 5 9  Difficult doing work/chores Not  difficult at all -       Assessment & Plan:  1. Annual physical exam - Discussed and encouraged healthy lifestyle choices- adequate sleep, regular exercise, stress management and healthy food choices.   2. Controlled type 2 diabetes mellitus without complication, without long-term current use of insulin ([place]) - discussed diagnosis with patient - follow up in 3 months, to bring blood sugar log - metFORMIN (GLUCOPHAGE-XR) 500 MG 24 hr tablet; Take 1 tablet (500 mg total) by mouth daily with breakfast.  Dispense: 90 tablet; Refill: 3 - atorvastatin (LIPITOR) 20 MG tablet; Take 1 tablet (20 mg total) by mouth daily.  Dispense: 90 tablet; Refill: 3 - blood glucose meter kit and supplies KIT; Dispense based on patient and insurance preference. Use up to four times daily as directed. (FOR ICD-9 250.00, 250.01).  Dispense: 1 each; Refill: 0  3. Onychomycosis of multiple toenails with type 2 diabetes mellitus (HCC) - terbinafine (LAMISIL) 250 MG tablet; Take 1 tablet (250 mg total) by mouth daily.  Dispense: 84 tablet; Refill: 0  4. COVID-19 virus vaccination refused - encouraged her to get vaccine, addressed concerns  5. Candidal intertrigo - improved per patient, discussed that discoloration may take weeks to resolve - encouraged her to avoid scratching  This visit occurred during the SARS-CoV-2 public health emergency.  Safety protocols were in place, including screening questions prior to the visit, additional usage of staff PPE, and extensive cleaning of exam room while observing appropriate contact time as indicated for disinfecting solutions.   Clarene Reamer, FNP-BC  [facility] at [facility], [place]  09/22/2019 6:19 PM

## 2019-09-22 NOTE — Patient Instructions (Signed)
Good to see you today  Keep up the great work with your diet and exercise! Drink your water every day, too!  Have a great time in [facility]  Follow up in 3 months to recheck blood sugar and cholesterol  I have sent in a cholesterol medication, diabetes medication and toenail fungus medication.   A resource that I like is www.dietdoctor.com/diabetes/diet  I recommend you check your blood sugar a couple of times a week and keep a log.  Very the time you check your blood sugar such as fasting, before meal, 2 hours after a meal and at bedtime.  Look for trends with the foods you are eating and be a scientist of your body. Bring your log with you!  Here are some guidelines to help you with meal planning -  Avoid all processed and packaged foods (bread, pasta, crackers, chips, etc) and beverages containing calories.  Avoid added sugars and excessive natural sugars.  Attention to how you feel if you consume artificial sweeteners.  Do they make you more hungry or raise your blood sugar?  With every meal and snack, aim to get 20 g of protein (3 ounces of meat, 4 ounces of fish, 3 eggs, protein powder, 1 cup [place] yogurt, 1 cup cottage cheese, etc.)  Increase fiber in the form of non-starchy vegetables.  These help you feel full with very little carbohydrates and are good for gut health.  Eat 1 serving healthy carb per meal- 1/2 cup brown rice, beans, potato, corn- pay attention to whether or not this significantly raises your blood sugar. If it does, reduce the frequency you consume these.   Eat 2-3 servings of lower sugar fruits daily.  This includes berries, apples, oranges, peaches, pears, one half banana.  Have small amounts of good fats such as avocado, nuts, olive oil, nut butters, olives.  Add a little cheese to your salads to make them tasty.

## 2019-12-24 ENCOUNTER — Other Ambulatory Visit: Payer: Self-pay

## 2019-12-24 ENCOUNTER — Encounter: Payer: Self-pay | Admitting: Family Medicine

## 2019-12-24 ENCOUNTER — Ambulatory Visit (INDEPENDENT_AMBULATORY_CARE_PROVIDER_SITE_OTHER): Payer: Self-pay | Admitting: Family Medicine

## 2019-12-24 VITALS — BP 122/68 | HR 96 | Temp 97.2°F | Ht 61.5 in | Wt 189.0 lb

## 2019-12-24 DIAGNOSIS — E119 Type 2 diabetes mellitus without complications: Secondary | ICD-10-CM | POA: Diagnosis not present

## 2019-12-24 DIAGNOSIS — Z23 Encounter for immunization: Secondary | ICD-10-CM

## 2019-12-24 LAB — HEMOGLOBIN A1C: Hgb A1c MFr Bld: 6.5 % (ref 4.6–6.5)

## 2019-12-24 LAB — LIPID PANEL
Cholesterol: 115 mg/dL (ref 0–200)
HDL: 31.6 mg/dL — ABNORMAL LOW (ref >39.00)
LDL Cholesterol: 48 mg/dL (ref 0–99)
NonHDL: 83.3
Total CHOL/HDL Ratio: 4
Triglycerides: 175 mg/dL — ABNORMAL HIGH (ref 0.0–149.0)
VLDL: 35 mg/dL (ref 0.0–40.0)

## 2019-12-24 NOTE — Patient Instructions (Addendum)
Good to see you today  Please follow up in 3 months  There is not one right eating plan for everyone.  It may take trial and error to find what will work for you.  It is important to get adequate protein and fiber with your meals.  It is okay to not eat breakfast or to skip meals if you are not hungry.  Avoid snacking between meals.  Unless you are on a fluid restriction, drink 80 to 90 ounces of water a day.  Suggested resources- www.dietdoctor.com/diabetes/diet www.adaptyourlifeacademy.com-there is a quiz to help you determine how many carbohydrates you should eat a day  www.thefastingmethod.com  If you have diabetes or access to a blood sugar machine, I recommend you check your blood sugar daily and keep a log.  Vary the time you check your blood sugar such as fasting, before meal, 2 hours after a meal and at bedtime.  Look for trends with the foods you are eating and be a scientist of your body.  Here are some guidelines to help you with meal planning -  Avoid all processed and packaged foods (bread, pasta, crackers, chips, etc) and beverages containing calories.  Avoid added sugars and excessive natural sugars.  Pay attention to how you feel if you consume artificial sweeteners.  Do they make you more hungry or raise your blood sugar?  With every meal and snack, aim to get 20 g of protein (3 ounces of meat, 4 ounces of fish, 3 eggs, protein powder, 1 cup Greek yogurt, 1 cup cottage cheese, etc.)  Increase fiber in the form of non-starchy vegetables.  These help you feel full with very little carbohydrates and are good for gut health.  Nonstarchy vegetables include summer squash, onions, peppers, tomatoes, eggplant, broccoli, cauliflower, cabbage, lettuce, spinach.  Have small amounts of good fats such as avocado, nuts, olive oil, nut butters, olives.  Add a little cheese to your meals to make them tasty.   Try to plan your meals for the week and do some meal preparation when able.  If  possible, make lunches for the week ahead of time.  Plan a couple of dinners and make enough so you can have leftovers.  Build in a treat once a week.   

## 2019-12-24 NOTE — Progress Notes (Signed)
   Subjective:    Patient ID: Robyn Barker, female    DOB: 11-25-1975, 44 y.o.   MRN: [id number]  HPI Chief Complaint  Patient presents with  . Follow-up    3 month flu a1c and lipid panel    She was seen 3 months ago. New diagnosis of diabetes, she was started on metformin xr 500 mg qd. She took a couple of weeks of metformin and then stopped it, worried about side effects. Not really watching her diet. Taking atorvastatin as prescribed.   Had positive Covid test. No symptoms, repeat testing negative x 2. Last month.   Onychomycosis- has been taking terbinafine, some improvement  Elavil- taking prn for sleep, improvement.   Review of Systems    No chest pain, SOB, leg swelling Objective:   Physical Exam Physical Exam  Vitals reviewed. Constitutional: Oriented to person, place, and time. Appears well-developed and well-nourished.  HENT:  Head: Normocephalic and atraumatic.  Eyes: Conjunctivae are normal.  Neck: Normal range of motion. Neck supple.  Cardiovascular: Normal rate.   Pulmonary/Chest: Effort normal.  Musculoskeletal: Normal range of motion.  Neurological: Alert and oriented to person, place, and time.  Psychiatric: Normal mood and affect. Behavior is normal. Judgment and thought content normal.      BP 122/68   Pulse 96   Temp (!) 97.2 F (36.2 C) (Temporal)   Ht 5' 1.5" (1.562 m)   Wt 189 lb (85.7 kg)   LMP 12/11/2019 (Approximate)   SpO2 98%   BMI 35.13 kg/m  Wt Readings from Last 3 Encounters:  12/24/19 189 lb (85.7 kg)  09/22/19 187 lb 12 oz (85.2 kg)  06/13/19 198 lb 3.2 oz (89.9 kg)       Assessment & Plan:  1. Controlled type 2 diabetes mellitus without complication, without long-term current use of insulin (HCC) - discussed importance of blood sugar control, encouraged her to make healthier food choices - follow up in 3 months - Lipid Panel - Hemoglobin A1c  2. Need for immunization against influenza - Flu Vaccine QUAD 36+ mos  IM  This visit occurred during the SARS-CoV-2 public health emergency.  Safety protocols were in place, including screening questions prior to the visit, additional usage of staff PPE, and extensive cleaning of exam room while observing appropriate contact time as indicated for disinfecting solutions.    Olean Ree, FNP-BC  [facility] at [facility], [facility]  12/24/2019 2:01 PM

## 2020-01-20 ENCOUNTER — Ambulatory Visit: Payer: No Typology Code available for payment source | Admitting: Obstetrics & Gynecology

## 2020-02-23 LAB — HM DIABETES FOOT EXAM

## 2020-03-09 ENCOUNTER — Ambulatory Visit: Payer: No Typology Code available for payment source | Admitting: Family Medicine

## 2020-03-11 ENCOUNTER — Encounter: Payer: Self-pay | Admitting: Family Medicine

## 2020-03-11 ENCOUNTER — Other Ambulatory Visit: Payer: Self-pay

## 2020-03-11 ENCOUNTER — Ambulatory Visit (INDEPENDENT_AMBULATORY_CARE_PROVIDER_SITE_OTHER): Payer: No Typology Code available for payment source | Admitting: Family Medicine

## 2020-03-11 VITALS — BP 106/76 | HR 95 | Temp 97.7°F | Ht 61.5 in | Wt 197.0 lb

## 2020-03-11 DIAGNOSIS — E119 Type 2 diabetes mellitus without complications: Secondary | ICD-10-CM | POA: Diagnosis not present

## 2020-03-11 DIAGNOSIS — E559 Vitamin D deficiency, unspecified: Secondary | ICD-10-CM | POA: Diagnosis not present

## 2020-03-11 DIAGNOSIS — E1169 Type 2 diabetes mellitus with other specified complication: Secondary | ICD-10-CM | POA: Diagnosis not present

## 2020-03-11 DIAGNOSIS — E782 Mixed hyperlipidemia: Secondary | ICD-10-CM | POA: Insufficient documentation

## 2020-03-11 DIAGNOSIS — Z6837 Body mass index (BMI) 37.0-37.9, adult: Secondary | ICD-10-CM

## 2020-03-11 DIAGNOSIS — J4520 Mild intermittent asthma, uncomplicated: Secondary | ICD-10-CM | POA: Insufficient documentation

## 2020-03-11 DIAGNOSIS — Z87891 Personal history of nicotine dependence: Secondary | ICD-10-CM

## 2020-03-11 DIAGNOSIS — Z1231 Encounter for screening mammogram for malignant neoplasm of breast: Secondary | ICD-10-CM

## 2020-03-11 DIAGNOSIS — E785 Hyperlipidemia, unspecified: Secondary | ICD-10-CM

## 2020-03-11 LAB — POCT GLYCOSYLATED HEMOGLOBIN (HGB A1C): Hemoglobin A1C: 6.2 % — AB (ref 4.0–5.6)
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
## 2020-03-11 NOTE — Patient Instructions (Addendum)
Keep working on healthy eating and regular exercise.  Congratulations on quitting smoking.  Diabetes Mellitus and Nutrition, Adult When you have diabetes, or diabetes mellitus, it is very important to have healthy eating habits because your blood sugar (glucose) levels are greatly affected by what you eat and drink. Eating healthy foods in the right amounts, at about the same times every day, can help you:  Control your blood glucose.  Lower your risk of heart disease.  Improve your blood pressure.  Reach or maintain a healthy weight. What can affect my meal plan? Every person with diabetes is different, and each person has different needs for a meal plan. Your health care provider may recommend that you work with a dietitian to make a meal plan that is best for you. Your meal plan may vary depending on factors such as:  The calories you need.  The medicines you take.  Your weight.  Your blood glucose, blood pressure, and cholesterol levels.  Your activity level.  Other health conditions you have, such as heart or kidney disease. How do carbohydrates affect me? Carbohydrates, also called carbs, affect your blood glucose level more than any other type of food. Eating carbs naturally raises the amount of glucose in your blood. Carb counting is a method for keeping track of how many carbs you eat. Counting carbs is important to keep your blood glucose at a healthy level, especially if you use insulin or take certain oral diabetes medicines. It is important to know how many carbs you can safely have in each meal. This is different for every person. Your dietitian can help you calculate how many carbs you should have at each meal and for each snack. How does alcohol affect me? Alcohol can cause a sudden decrease in blood glucose (hypoglycemia), especially if you use insulin or take certain oral diabetes medicines. Hypoglycemia can be a life-threatening condition. Symptoms of hypoglycemia,  such as sleepiness, dizziness, and confusion, are similar to symptoms of having too much alcohol.  Do not drink alcohol if: ? Your health care provider tells you not to drink. ? You are pregnant, may be pregnant, or are planning to become pregnant.  If you drink alcohol: ? Do not drink on an empty stomach. ? Limit how much you use to:  0-1 drink a day for women.  0-2 drinks a day for men. ? Be aware of how much alcohol is in your drink. In the [place] one drink equals one 12 oz bottle of beer (355 mL), one 5 oz glass of wine (148 mL), or one 1 oz glass of hard liquor (44 mL). ? Keep yourself hydrated with water, diet soda, or unsweetened iced tea.  Keep in mind that regular soda, juice, and other mixers may contain a lot of sugar and must be counted as carbs. What are tips for following this plan? Reading food labels  Start by checking the serving size on the "Nutrition Facts" label of packaged foods and drinks. The amount of calories, carbs, fats, and other nutrients listed on the label is based on one serving of the item. Many items contain more than one serving per package.  Check the total grams (g) of carbs in one serving. You can calculate the number of servings of carbs in one serving by dividing the total carbs by 15. For example, if a food has 30 g of total carbs per serving, it would be equal to 2 servings of carbs.  Check the number of grams (  g) of saturated fats and trans fats in one serving. Choose foods that have a low amount or none of these fats.  Check the number of milligrams (mg) of salt (sodium) in one serving. Most people should limit total sodium intake to less than 2,300 mg per day.  Always check the nutrition information of foods labeled as "low-fat" or "nonfat." These foods may be higher in added sugar or refined carbs and should be avoided.  Talk to your dietitian to identify your daily goals for nutrients listed on the label. Shopping  Avoid buying canned,  pre-made, or processed foods. These foods tend to be high in fat, sodium, and added sugar.  Shop around the outside edge of the grocery store. This is where you will most often find fresh fruits and vegetables, bulk grains, fresh meats, and fresh dairy. Cooking  Use low-heat cooking methods, such as baking, instead of high-heat cooking methods like deep frying.  Cook using healthy oils, such as olive, canola, or sunflower oil.  Avoid cooking with butter, cream, or high-fat meats. Meal planning  Eat meals and snacks regularly, preferably at the same times every day. Avoid going long periods of time without eating.  Eat foods that are high in fiber, such as fresh fruits, vegetables, beans, and whole grains. Talk with your dietitian about how many servings of carbs you can eat at each meal.  Eat 4-6 oz (112-168 g) of lean protein each day, such as lean meat, chicken, fish, eggs, or tofu. One ounce (oz) of lean protein is equal to: ? 1 oz (28 g) of meat, chicken, or fish. ? 1 egg. ?  cup (62 g) of tofu.  Eat some foods each day that contain healthy fats, such as avocado, nuts, seeds, and fish.   What foods should I eat? Fruits Berries. Apples. Oranges. Peaches. Apricots. Plums. Grapes. Mango. Papaya. Pomegranate. Kiwi. Cherries. Vegetables Lettuce. Spinach. Leafy greens, including kale, chard, collard greens, and mustard greens. Beets. Cauliflower. Cabbage. Broccoli. Carrots. Green beans. Tomatoes. Peppers. Onions. Cucumbers. Brussels sprouts. Grains Whole grains, such as whole-wheat or whole-grain bread, crackers, tortillas, cereal, and pasta. Unsweetened oatmeal. Quinoa. Brown or wild rice. Meats and other proteins Seafood. Poultry without skin. Lean cuts of poultry and beef. Tofu. Nuts. Seeds. Dairy Low-fat or fat-free dairy products such as milk, yogurt, and cheese. The items listed above may not be a complete list of foods and beverages you can eat. Contact a dietitian for more  information. What foods should I avoid? Fruits Fruits canned with syrup. Vegetables Canned vegetables. Frozen vegetables with butter or cream sauce. Grains Refined white flour and flour products such as bread, pasta, snack foods, and cereals. Avoid all processed foods. Meats and other proteins Fatty cuts of meat. Poultry with skin. Breaded or fried meats. Processed meat. Avoid saturated fats. Dairy Full-fat yogurt, cheese, or milk. Beverages Sweetened drinks, such as soda or iced tea. The items listed above may not be a complete list of foods and beverages you should avoid. Contact a dietitian for more information. Questions to ask a health care provider  Do I need to meet with a diabetes educator?  Do I need to meet with a dietitian?  What number can I call if I have questions?  When are the best times to check my blood glucose? Where to find more information:  American Diabetes Association: diabetes.org  Academy of Nutrition and Dietetics: www.eatright.[employer] of Diabetes and Digestive and Kidney Diseases: CarFlippers.tn  Association of Diabetes  Care and Education Specialists: www.diabeteseducator.org Summary  It is important to have healthy eating habits because your blood sugar (glucose) levels are greatly affected by what you eat and drink.  A healthy meal plan will help you control your blood glucose and maintain a healthy lifestyle.  Your health care provider may recommend that you work with a dietitian to make a meal plan that is best for you.  Keep in mind that carbohydrates (carbs) and alcohol have immediate effects on your blood glucose levels. It is important to count carbs and to use alcohol carefully. This information is not intended to replace advice given to you by your health care provider. Make sure you discuss any questions you have with your health care provider. Document Revised: 01/14/2019 Document Reviewed: 01/14/2019 Elsevier  Patient Education  2021 Elsevier Inc.    Diabetes Mellitus and Exercise Exercising regularly is important for overall health, especially for people who have diabetes mellitus. Exercising is not only about losing weight. It has many other health benefits, such as increasing muscle strength and bone density and reducing body fat and stress. This leads to improved fitness, flexibility, and endurance, all of which result in better overall health. What are the benefits of exercise if I have diabetes? Exercise has many benefits for people with diabetes. They include:  Helping to lower and control blood sugar (glucose).  Helping the body to respond better to the hormone insulin by improving insulin sensitivity.  Reducing how much insulin the body needs.  Lowering the risk for heart disease by: ? Lowering "bad" cholesterol and triglyceride levels. ? Increasing "good" cholesterol levels. ? Lowering blood pressure. ? Lowering blood glucose levels. What is my activity plan? Your health care provider or certified diabetes educator can help you make a plan for the type and frequency of exercise that works for you. This is called your activity plan. Be sure to:  Get at least 150 minutes of medium-intensity or high-intensity exercise each week. Exercises may include brisk walking, biking, or water aerobics.  Do stretching and strengthening exercises, such as yoga or weight lifting, at least 2 times a week.  Spread out your activity over at least 3 days of the week.  Get some form of physical activity each day. ? Do not go more than 2 days in a row without some kind of physical activity. ? Avoid being inactive for more than 90 minutes at a time. Take frequent breaks to walk or stretch.  Choose exercises or activities that you enjoy. Set realistic goals.  Start slowly and gradually increase your exercise intensity over time.   How do I manage my diabetes during exercise? Monitor your blood  glucose  Check your blood glucose before and after exercising. If your blood glucose is: ? 240 mg/dL (50.3 mmol/L) or higher before you exercise, check your urine for ketones. These are chemicals created by the liver. If you have ketones in your urine, do not exercise until your blood glucose returns to normal. ? 100 mg/dL (5.6 mmol/L) or lower, eat a snack containing 15-20 grams of carbohydrate. Check your blood glucose 15 minutes after the snack to make sure that your glucose level is above 100 mg/dL (5.6 mmol/L) before you start your exercise.  Know the symptoms of low blood glucose (hypoglycemia) and how to treat it. Your risk for hypoglycemia increases during and after exercise. Follow these tips and your health care provider's instructions  Keep a carbohydrate snack that is fast-acting for use before, during, and  after exercise to help prevent or treat hypoglycemia.  Avoid injecting insulin into areas of the body that are going to be exercised. For example, avoid injecting insulin into: ? Your arms, when you are about to play tennis. ? Your legs, when you are about to go jogging.  Keep records of your exercise habits. Doing this can help you and your health care provider adjust your diabetes management plan as needed. Write down: ? Food that you eat before and after you exercise. ? Blood glucose levels before and after you exercise. ? The type and amount of exercise you have done.  Work with your health care provider when you start a new exercise or activity. He or she may need to: ? Make sure that the activity is safe for you. ? Adjust your insulin, other medicines, and food that you eat.  Drink plenty of water while you exercise. This prevents loss of water (dehydration) and problems caused by a lot of heat in the body (heat stroke).   Where to find more information  American Diabetes Association: www.diabetes.org Summary  Exercising regularly is important for overall health,  especially for people who have diabetes mellitus.  Exercising has many health benefits. It increases muscle strength and bone density and reduces body fat and stress. It also lowers and controls blood glucose.  Your health care provider or certified diabetes educator can help you make an activity plan for the type and frequency of exercise that works for you.  Work with your health care provider to make sure any new activity is safe for you. Also work with your health care provider to adjust your insulin, other medicines, and the food you eat. This information is not intended to replace advice given to you by your health care provider. Make sure you discuss any questions you have with your health care provider. Document Revised: 11/04/2018 Document Reviewed: 11/04/2018 Elsevier Patient Education  2021 [employer].

## 2020-03-11 NOTE — Assessment & Plan Note (Signed)
Intermittent issues.Marland Kitchen albuterol prn.

## 2020-03-12 NOTE — Assessment & Plan Note (Signed)
Stable, chronic.  Continue current medication.   On atorvastatin 20 mg daily.

## 2020-03-12 NOTE — Assessment & Plan Note (Signed)
Encouraged exercise, weight loss, healthy eating habits. ? ?

## 2020-03-12 NOTE — Assessment & Plan Note (Signed)
Pt congratulated and steps for maintenance reviewed.

## 2020-03-12 NOTE — Assessment & Plan Note (Signed)
New diagnosis of DM at end of 2021.. started on metfomrin, but did not take. A1C improved now back in prediabetes/well controlled DM range on no medicaiton with lifestyle changes.   Reviewed diabetes care... re-eval in .

## 2020-03-26 ENCOUNTER — Ambulatory Visit: Payer: No Typology Code available for payment source | Admitting: Family Medicine

## 2020-05-18 ENCOUNTER — Other Ambulatory Visit: Payer: Self-pay | Admitting: Family Medicine

## 2020-05-18 DIAGNOSIS — Z1231 Encounter for screening mammogram for malignant neoplasm of breast: Secondary | ICD-10-CM

## 2020-05-27 ENCOUNTER — Telehealth: Payer: Self-pay | Admitting: Family Medicine

## 2020-05-27 DIAGNOSIS — E119 Type 2 diabetes mellitus without complications: Secondary | ICD-10-CM

## 2020-05-27 NOTE — Telephone Encounter (Signed)
-----   Message from Alvina Chou sent at 05/24/2020 11:36 AM EDT ----- Regarding: Lab orders for Wednesday, 4.20.22 Lab orders for a f/u appt

## 2020-06-03 ENCOUNTER — Other Ambulatory Visit: Payer: Self-pay

## 2020-06-03 ENCOUNTER — Ambulatory Visit
Admission: RE | Admit: 2020-06-03 | Discharge: 2020-06-03 | Disposition: A | Discharge status: Home / Self Care | Payer: PRIVATE HEALTH INSURANCE | Source: Ambulatory Visit | Attending: Family Medicine | Admitting: Family Medicine

## 2020-06-03 DIAGNOSIS — Z1231 Encounter for screening mammogram for malignant neoplasm of breast: Secondary | ICD-10-CM | POA: Diagnosis not present

## 2020-06-08 ENCOUNTER — Other Ambulatory Visit: Payer: Self-pay

## 2020-06-09 ENCOUNTER — Other Ambulatory Visit (INDEPENDENT_AMBULATORY_CARE_PROVIDER_SITE_OTHER): Payer: No Typology Code available for payment source

## 2020-06-09 ENCOUNTER — Other Ambulatory Visit: Payer: Self-pay

## 2020-06-09 DIAGNOSIS — E119 Type 2 diabetes mellitus without complications: Secondary | ICD-10-CM

## 2020-06-09 LAB — MICROALBUMIN / CREATININE URINE RATIO
Creatinine,U: 180.9 mg/dL
Microalb Creat Ratio: 1.1 mg/g (ref 0.0–30.0)
Microalb, Ur: 2 mg/dL — ABNORMAL HIGH (ref 0.0–1.9)

## 2020-06-09 LAB — LIPID PANEL
Cholesterol: 127 mg/dL (ref 0–200)
HDL: 35.1 mg/dL — ABNORMAL LOW (ref >39.00)
LDL Cholesterol: 56 mg/dL (ref 0–99)
NonHDL: 91.4
Total CHOL/HDL Ratio: 4
Triglycerides: 178 mg/dL — ABNORMAL HIGH (ref 0.0–149.0)
VLDL: 35.6 mg/dL (ref 0.0–40.0)

## 2020-06-09 LAB — COMPREHENSIVE METABOLIC PANEL
ALT: 15 U/L (ref 0–35)
AST: 16 U/L (ref 0–37)
Albumin: 3.8 g/dL (ref 3.5–5.2)
Alkaline Phosphatase: 42 U/L (ref 39–117)
BUN: 12 mg/dL (ref 6–23)
CO2: 26 mEq/L (ref 19–32)
Calcium: 9.3 mg/dL (ref 8.4–10.5)
Chloride: 105 mEq/L (ref 96–112)
Creatinine, Ser: 0.85 mg/dL (ref 0.40–1.20)
GFR: 83.26 mL/min (ref >60.00)
Glucose, Bld: 104 mg/dL — ABNORMAL HIGH (ref 70–99)
Potassium: 3.8 mEq/L (ref 3.5–5.1)
Sodium: 139 mEq/L (ref 135–145)
Total Bilirubin: 0.2 mg/dL (ref 0.2–1.2)
Total Protein: 6.9 g/dL (ref 6.0–8.3)

## 2020-06-09 LAB — HEMOGLOBIN A1C: Hgb A1c MFr Bld: 6.9 % — ABNORMAL HIGH (ref 4.6–6.5)

## 2020-06-10 NOTE — Progress Notes (Signed)
No critical labs need to be addressed urgently. We will discuss labs in detail at upcoming office visit.   

## 2020-06-11 ENCOUNTER — Other Ambulatory Visit: Payer: Self-pay

## 2020-06-11 ENCOUNTER — Ambulatory Visit (INDEPENDENT_AMBULATORY_CARE_PROVIDER_SITE_OTHER): Payer: No Typology Code available for payment source | Admitting: Family Medicine

## 2020-06-11 VITALS — BP 118/80 | HR 98 | Temp 97.8°F | Ht 61.5 in | Wt 206.8 lb

## 2020-06-11 DIAGNOSIS — R5383 Other fatigue: Secondary | ICD-10-CM

## 2020-06-11 DIAGNOSIS — E1169 Type 2 diabetes mellitus with other specified complication: Secondary | ICD-10-CM | POA: Diagnosis not present

## 2020-06-11 DIAGNOSIS — E119 Type 2 diabetes mellitus without complications: Secondary | ICD-10-CM | POA: Diagnosis not present

## 2020-06-11 DIAGNOSIS — E785 Hyperlipidemia, unspecified: Secondary | ICD-10-CM

## 2020-06-11 DIAGNOSIS — R002 Palpitations: Secondary | ICD-10-CM | POA: Insufficient documentation

## 2020-06-11 MED ORDER — IBUPROFEN 800 MG PO TABS: ORAL_TABLET | ORAL | 3 refills | Status: DC

## 2020-06-11 NOTE — Assessment & Plan Note (Signed)
Likely due to working 2 shifts at work constantly.. plans to stop.  if not improving consider labs re-eval.

## 2020-06-11 NOTE — Assessment & Plan Note (Signed)
LDL at goal on atorvastatin 20 mg daily to every three days.

## 2020-06-11 NOTE — Patient Instructions (Addendum)
Work  on walking 3 times a week.  Smaller portion size each meal. If still hungry.. drink water or add no carbohydrate seconds.  Stop soda and drink more water. Decrease caffeine.  Carbonated water or Zevia instead.  Can take atorvastatin every 3 days.  If fatigue not improving with decreased job duties.

## 2020-06-11 NOTE — Assessment & Plan Note (Signed)
Worsened control.. not focusing on health  in last 3 months.  Work  on walking 3 times a week.  Smaller portion size each meal. If still hungry.. drink water or add no carbohydrate seconds.  Stop soda and drink more water. Decrease caffeine.  Carbonated water or Zevia instead.  Can take atorvastatin every 3 days.   Encouraged exercise, weight loss, healthy eating habits.

## 2020-06-11 NOTE — Assessment & Plan Note (Signed)
Increase water, decrease caffeine.

## 2020-06-11 NOTE — Progress Notes (Signed)
Patient ID: Robyn Barker, female    DOB: 11/29/1975, 45 y.o.   MRN: [id number]  This visit was conducted in person.  BP 118/80   Pulse 98   Temp 97.8 F (36.6 C) (Temporal)   Ht 5' 1.5" (1.562 m)   Wt 206 lb 12 oz (93.8 kg)   LMP 06/08/2020 (Exact Date)   SpO2 99%   BMI 38.43 kg/m    CC:  Chief Complaint  Patient presents with  . Follow-up    3 month- DM     Subjective:   HPI: Robyn Barker is a 45 y.o. female presenting on 06/11/2020 for Follow-up (3 month- DM )   Diabetes:   Lab Results  Component Value Date   HGBA1C 6.9 (H) 06/09/2020  Using medications without difficulties: Hypoglycemic episodes: Hyperglycemic episodes: Feet problems: Blood Sugars averaging: eye exam within last year:   She has gained weight back  after stopping BHCG , phentermine and  Wt Readings from Last 3 Encounters:  06/11/20 206 lb 12 oz (93.8 kg)  03/11/20 197 lb (89.4 kg)  12/24/19 189 lb (85.7 kg)    She is very tired... does have heavier menses for 3 days..  Occurring monthly.. last 4 days   Elevated Cholesterol:  LDL at goal < 100. Has not been taking atorvastatin   Lab Results  Component Value Date   CHOL 127 06/09/2020   HDL 35.10 (L) 06/09/2020   LDLCALC 56 06/09/2020   LDLDIRECT 75.0 09/08/2019   TRIG 178.0 (H) 06/09/2020   CHOLHDL 4 06/09/2020   Using medications without problems: Muscle aches:  Diet compliance: Exercise: Other complaints:      Relevant past medical, surgical, family and social history reviewed and updated as indicated. Interim medical history since our last visit reviewed. Allergies and medications reviewed and updated. Outpatient Medications Prior to Visit  Medication Sig Dispense Refill  . amitriptyline (ELAVIL) 25 MG tablet Take 25 mg by mouth at bedtime as needed for sleep.    Marland Kitchen atorvastatin (LIPITOR) 20 MG tablet Take 1 tablet (20 mg total) by mouth daily. 90 tablet 3  . baclofen (LIORESAL) 10 MG tablet Take 10 mg by mouth  daily as needed for muscle spasms.    . blood glucose meter kit and supplies KIT Dispense based on patient and insurance preference. Use up to four times daily as directed. (FOR ICD-9 250.00, 250.01). 1 each 0  . Cholecalciferol (VITAMIN D3) 125 MCG (5000 UT) CAPS Take 1 capsule (5,000 Units total) by mouth daily. 30 capsule 95  . ibuprofen (ADVIL) 800 MG tablet ibuprofen 800 mg tablet  TAKE 1 TABLET BY MOUTH EVERY EIGHT HOURS AS NEEDED FOR PAIN.    Marland Kitchen terbinafine (LAMISIL) 250 MG tablet Take 1 tablet (250 mg total) by mouth daily. (Patient not taking: Reported on 06/11/2020) 84 tablet 0   No facility-administered medications prior to visit.     Per HPI unless specifically indicated in ROS section below Review of Systems  Constitutional: Negative for fatigue and fever.  HENT: Negative for congestion.   Eyes: Negative for pain.  Respiratory: Negative for cough and shortness of breath.   Cardiovascular: Positive for palpitations. Negative for chest pain and leg swelling.  Gastrointestinal: Negative for abdominal pain.  Genitourinary: Negative for dysuria and vaginal bleeding.  Musculoskeletal: Negative for back pain.  Neurological: Negative for syncope, light-headedness and headaches.  Psychiatric/Behavioral: Negative for dysphoric mood.   Objective:  BP 118/80   Pulse 98   Temp  97.8 F (36.6 C) (Temporal)   Ht 5' 1.5" (1.562 m)   Wt 206 lb 12 oz (93.8 kg)   LMP 06/08/2020 (Exact Date)   SpO2 99%   BMI 38.43 kg/m   Wt Readings from Last 3 Encounters:  06/11/20 206 lb 12 oz (93.8 kg)  03/11/20 197 lb (89.4 kg)  12/24/19 189 lb (85.7 kg)      Physical Exam Constitutional:      General: She is not in acute distress.    Appearance: Normal appearance. She is well-developed. She is obese. She is not ill-appearing or toxic-appearing.  HENT:     Head: Normocephalic.     Right Ear: Hearing, tympanic membrane, ear canal and external ear normal. Tympanic membrane is not erythematous,  retracted or bulging.     Left Ear: Hearing, tympanic membrane, ear canal and external ear normal. Tympanic membrane is not erythematous, retracted or bulging.     Nose: No mucosal edema or rhinorrhea.     Right Sinus: No maxillary sinus tenderness or frontal sinus tenderness.     Left Sinus: No maxillary sinus tenderness or frontal sinus tenderness.     Mouth/Throat:     Pharynx: Uvula midline.  Eyes:     General: Lids are normal. Lids are everted, no foreign bodies appreciated.     Conjunctiva/sclera: Conjunctivae normal.     Pupils: Pupils are equal, round, and reactive to light.  Neck:     Thyroid: No thyroid mass or thyromegaly.     Vascular: No carotid bruit.     Trachea: Trachea normal.  Cardiovascular:     Rate and Rhythm: Normal rate and regular rhythm.     Pulses: Normal pulses.     Heart sounds: Normal heart sounds, S1 normal and S2 normal. No murmur heard. No friction rub. No gallop.   Pulmonary:     Effort: Pulmonary effort is normal. No tachypnea or respiratory distress.     Breath sounds: Normal breath sounds. No decreased breath sounds, wheezing, rhonchi or rales.  Abdominal:     General: Bowel sounds are normal.     Palpations: Abdomen is soft.     Tenderness: There is no abdominal tenderness.  Musculoskeletal:     Cervical back: Normal range of motion and neck supple.  Skin:    General: Skin is warm and dry.     Findings: No rash.  Neurological:     Mental Status: She is alert.  Psychiatric:        Mood and Affect: Mood is not anxious or depressed.        Speech: Speech normal.        Behavior: Behavior normal. Behavior is cooperative.        Thought Content: Thought content normal.        Judgment: Judgment normal.       Results for orders placed or performed in visit on 06/09/20  Microalbumin / creatinine urine ratio  Result Value Ref Range   Microalb, Ur 2.0 (H) 0.0 - 1.9 mg/dL   Creatinine,U 180.9 mg/dL   Microalb Creat Ratio 1.1 0.0 - 30.0 mg/g   Comprehensive metabolic panel  Result Value Ref Range   Sodium 139 135 - 145 mEq/L   Potassium 3.8 3.5 - 5.1 mEq/L   Chloride 105 96 - 112 mEq/L   CO2 26 19 - 32 mEq/L   Glucose, Bld 104 (H) 70 - 99 mg/dL   BUN 12 6 - 23 mg/dL   Creatinine, Ser  0.85 0.40 - 1.20 mg/dL   Total Bilirubin 0.2 0.2 - 1.2 mg/dL   Alkaline Phosphatase 42 39 - 117 U/L   AST 16 0 - 37 U/L   ALT 15 0 - 35 U/L   Total Protein 6.9 6.0 - 8.3 g/dL   Albumin 3.8 3.5 - 5.2 g/dL   GFR 83.26 >60.00 mL/min   Calcium 9.3 8.4 - 10.5 mg/dL  Hemoglobin A1c  Result Value Ref Range   Hgb A1c MFr Bld 6.9 (H) 4.6 - 6.5 %  Lipid panel  Result Value Ref Range   Cholesterol 127 0 - 200 mg/dL   Triglycerides 178.0 (H) 0.0 - 149.0 mg/dL   HDL 35.10 (L) >39.00 mg/dL   VLDL 35.6 0.0 - 40.0 mg/dL   LDL Cholesterol 56 0 - 99 mg/dL   Total CHOL/HDL Ratio 4    NonHDL 91.40     This visit occurred during the SARS-CoV-2 public health emergency.  Safety protocols were in place, including screening questions prior to the visit, additional usage of staff PPE, and extensive cleaning of exam room while observing appropriate contact time as indicated for disinfecting solutions.   COVID 19 screen:  No recent travel or known exposure to COVID19 The patient denies respiratory symptoms of COVID 19 at this time. The importance of social distancing was discussed today.   Assessment and Plan Problem List Items Addressed This Visit    Controlled type 2 diabetes mellitus without complication, without long-term current use of insulin ([facility]) - Primary    Worsened control.. not focusing on health  in last 3 months.  Work  on walking 3 times a week.  Smaller portion size each meal. If still hungry.. drink water or add no carbohydrate seconds.  Stop soda and drink more water. Decrease caffeine.  Carbonated water or Zevia instead.  Can take atorvastatin every 3 days.   Encouraged exercise, weight loss, healthy eating habits.        Hyperlipidemia associated with type 2 diabetes mellitus (HCC)    LDL at goal on atorvastatin 20 mg daily to every three days.      Other fatigue    Likely due to working 2 shifts at work constantly.. plans to stop.  if not improving consider labs re-eval.      Palpitations    Increase water, decrease caffeine.            Eliezer Lofts, MD

## 2020-08-17 LAB — HM DIABETES EYE EXAM

## 2020-09-10 ENCOUNTER — Ambulatory Visit: Payer: No Typology Code available for payment source | Admitting: Family Medicine

## 2020-09-10 ENCOUNTER — Other Ambulatory Visit: Payer: Self-pay

## 2020-09-10 ENCOUNTER — Encounter: Payer: Self-pay | Admitting: Family Medicine

## 2020-09-10 ENCOUNTER — Ambulatory Visit (INDEPENDENT_AMBULATORY_CARE_PROVIDER_SITE_OTHER): Payer: No Typology Code available for payment source | Admitting: Family Medicine

## 2020-09-10 VITALS — BP 92/66 | HR 98 | Temp 99.4°F | Ht 61.5 in | Wt 210.8 lb

## 2020-09-10 DIAGNOSIS — E119 Type 2 diabetes mellitus without complications: Secondary | ICD-10-CM

## 2020-09-10 DIAGNOSIS — L209 Atopic dermatitis, unspecified: Secondary | ICD-10-CM | POA: Diagnosis not present

## 2020-09-10 LAB — POCT GLYCOSYLATED HEMOGLOBIN (HGB A1C): Hemoglobin A1C: 6.8 % — AB (ref 4.0–5.6)

## 2020-09-10 MED ORDER — TRIAMCINOLONE ACETONIDE 0.5 % EX CREA: 1.0000 "application " | TOPICAL_CREAM | Freq: Two times a day (BID) | CUTANEOUS | 0 refills | Status: DC

## 2020-09-10 NOTE — Patient Instructions (Addendum)
Apply the cream to the rash 2 times daily x 2 weeks.. call if not improving.  Keep up working on healthy eating and regular exercise.

## 2020-09-10 NOTE — Progress Notes (Addendum)
  Patient ID: Robyn Barker, female    DOB: 10/11/1975, 44 y.o.   MRN: [id number]  This visit was conducted in person.  Ht 5' 1.5" (1.562 m)   BMI 38.43 kg/m   BP 92/66, temp 9.4, O2Sat 97%, Wt 210  CC:  Chief Complaint  Patient presents with   Diabetes    Subjective:   HPI: Robyn Barker is a 44 y.o. female presenting on 09/10/2020 for Diabetes  Diabetes:  HgA1C 6.8 .. diet controlled Using medications without difficulties: she is now starting a new diet her sister does.. reviewed supplements in detail and nutrition guidelines.. no concerns  Hypoglycemic episodes: Hyperglycemic episodes: Feet problems: none Blood Sugars averaging: not checking eye exam within last year: yes  Has  not restarted atorvastatin  every 3 days... she wants to avoid med use.    Has continue to stay away from smoking.  She has noted itchy rash on bilateral forearms.. she uses many cleaners and fragrance body lotions and soaps  Relevant past medical, surgical, family and social history reviewed and updated as indicated. Interim medical history since our last visit reviewed. Allergies and medications reviewed and updated. Outpatient Medications Prior to Visit  Medication Sig Dispense Refill   amitriptyline (ELAVIL) 25 MG tablet Take 25 mg by mouth at bedtime as needed for sleep.     atorvastatin (LIPITOR) 20 MG tablet Take 1 tablet (20 mg total) by mouth daily. 90 tablet 3   baclofen (LIORESAL) 10 MG tablet Take 10 mg by mouth daily as needed for muscle spasms.     blood glucose meter kit and supplies KIT Dispense based on patient and insurance preference. Use up to four times daily as directed. (FOR ICD-9 250.00, 250.01). 1 each 0   Cholecalciferol (VITAMIN D3) 125 MCG (5000 UT) CAPS Take 1 capsule (5,000 Units total) by mouth daily. 30 capsule 95   ibuprofen (ADVIL) 800 MG tablet ibuprofen 800 mg tablet  TAKE 1 TABLET BY MOUTH EVERY EIGHT HOURS AS NEEDED FOR PAIN. 30 tablet 3    terbinafine (LAMISIL) 250 MG tablet Take 1 tablet (250 mg total) by mouth daily. (Patient not taking: Reported on 06/11/2020) 84 tablet 0   No facility-administered medications prior to visit.     Per HPI unless specifically indicated in ROS section below Review of Systems  Constitutional:  Negative for fatigue and fever.  HENT:  Negative for ear pain.   Eyes:  Negative for pain.  Respiratory:  Negative for chest tightness and shortness of breath.   Cardiovascular:  Negative for chest pain, palpitations and leg swelling.  Gastrointestinal:  Negative for abdominal pain.  Genitourinary:  Negative for dysuria.  Objective:  Ht 5' 1.5" (1.562 m)   BMI 38.43 kg/m   Wt Readings from Last 3 Encounters:  06/11/20 206 lb 12 oz (93.8 kg)  03/11/20 197 lb (89.4 kg)  12/24/19 189 lb (85.7 kg)      Physical Exam Constitutional:      General: She is not in acute distress.    Appearance: Normal appearance. She is well-developed. She is not ill-appearing or toxic-appearing.  HENT:     Head: Normocephalic.     Right Ear: Hearing, tympanic membrane, ear canal and external ear normal. Tympanic membrane is not erythematous, retracted or bulging.     Left Ear: Hearing, tympanic membrane, ear canal and external ear normal. Tympanic membrane is not erythematous, retracted or bulging.     Nose: No mucosal edema or rhinorrhea.       Right Sinus: No maxillary sinus tenderness or frontal sinus tenderness.     Left Sinus: No maxillary sinus tenderness or frontal sinus tenderness.     Mouth/Throat:     Pharynx: Uvula midline.  Eyes:     General: Lids are normal. Lids are everted, no foreign bodies appreciated.     Conjunctiva/sclera: Conjunctivae normal.     Pupils: Pupils are equal, round, and reactive to light.  Neck:     Thyroid: No thyroid mass or thyromegaly.     Vascular: No carotid bruit.     Trachea: Trachea normal.  Cardiovascular:     Rate and Rhythm: Normal rate and regular rhythm.      Pulses: Normal pulses.     Heart sounds: Normal heart sounds, S1 normal and S2 normal. No murmur heard.   No friction rub. No gallop.  Pulmonary:     Effort: Pulmonary effort is normal. No tachypnea or respiratory distress.     Breath sounds: Normal breath sounds. No decreased breath sounds, wheezing, rhonchi or rales.  Abdominal:     General: Bowel sounds are normal.     Palpations: Abdomen is soft.     Tenderness: There is no abdominal tenderness.  Musculoskeletal:     Cervical back: Normal range of motion and neck supple.  Skin:    General: Skin is warm and dry.     Findings: No rash.          Comments:  Early rash appears like erythematous dry flaky circles, progressed rash with rubbing is hyperpigmented and thickened skin with flake... mainly on left forearm.  Neurological:     Mental Status: She is alert.  Psychiatric:        Mood and Affect: Mood is not anxious or depressed.        Speech: Speech normal.        Behavior: Behavior normal. Behavior is cooperative.        Thought Content: Thought content normal.        Judgment: Judgment normal.      Results for orders placed or performed in visit on 06/09/20  Microalbumin / creatinine urine ratio  Result Value Ref Range   Microalb, Ur 2.0 (H) 0.0 - 1.9 mg/dL   Creatinine,U 180.9 mg/dL   Microalb Creat Ratio 1.1 0.0 - 30.0 mg/g  Comprehensive metabolic panel  Result Value Ref Range   Sodium 139 135 - 145 mEq/L   Potassium 3.8 3.5 - 5.1 mEq/L   Chloride 105 96 - 112 mEq/L   CO2 26 19 - 32 mEq/L   Glucose, Bld 104 (H) 70 - 99 mg/dL   BUN 12 6 - 23 mg/dL   Creatinine, Ser 0.85 0.40 - 1.20 mg/dL   Total Bilirubin 0.2 0.2 - 1.2 mg/dL   Alkaline Phosphatase 42 39 - 117 U/L   AST 16 0 - 37 U/L   ALT 15 0 - 35 U/L   Total Protein 6.9 6.0 - 8.3 g/dL   Albumin 3.8 3.5 - 5.2 g/dL   GFR 83.26 >60.00 mL/min   Calcium 9.3 8.4 - 10.5 mg/dL  Hemoglobin A1c  Result Value Ref Range   Hgb A1c MFr Bld 6.9 (H) 4.6 - 6.5 %  Lipid  panel  Result Value Ref Range   Cholesterol 127 0 - 200 mg/dL   Triglycerides 178.0 (H) 0.0 - 149.0 mg/dL   HDL 35.10 (L) >39.00 mg/dL   VLDL 35.6 0.0 - 40.0 mg/dL   LDL Cholesterol 56 0 -   99 mg/dL   Total CHOL/HDL Ratio 4    NonHDL 91.40     This visit occurred during the SARS-CoV-2 public health emergency.  Safety protocols were in place, including screening questions prior to the visit, additional usage of staff PPE, and extensive cleaning of exam room while observing appropriate contact time as indicated for disinfecting solutions.   COVID 19 screen:  No recent travel or known exposure to COVID19 The patient denies respiratory symptoms of COVID 19 at this time. The importance of social distancing was discussed today.   Assessment and Plan    Problem List Items Addressed This Visit     Atopic dermatitis    Treat with topical steroid cream BID x 2 week.. reviewed safe use of the medication.       Controlled type 2 diabetes mellitus without complication, without long-term current use of insulin (HCC) - Primary   Relevant Orders   POCT glycosylated hemoglobin (Hb A1C) (Completed)   Meds ordered this encounter  Medications   triamcinolone cream (KENALOG) 0.5 %    Sig: Apply 1 application topically 2 (two) times daily.    Dispense:  30 g    Refill:  0     Eliezer Lofts, MD

## 2020-09-10 NOTE — Assessment & Plan Note (Signed)
Treat with topical steroid cream BID x 2 week.. reviewed safe use of the medication.

## 2020-10-21 ENCOUNTER — Telehealth: Payer: Self-pay

## 2020-10-21 NOTE — Telephone Encounter (Signed)
Noted and agree with dispo. ?

## 2020-10-21 NOTE — Telephone Encounter (Signed)
Unable to reach pt by phone and left v/m requesting pt to cb. 

## 2020-10-21 NOTE — Telephone Encounter (Signed)
[facility] Night - Client Nonclinical Telephone Record AccessNurse Client [facility] Night - Client Client Site [facility] - Night Physician Kerby Nora - MD Contact Type Call Who Is Calling Patient / Member / Family / Caregiver Caller Name Monay Houlton Caller Phone Number [phone] Patient Name Robyn Barker Patient DOB Aug 16, 1975 Call Type Message Only Information Provided Reason for Call Request for General Office Information Initial Comment Caller states she has sx of bronchitis and is requesting meds or does she need to come in? Disp. Time Disposition Final User 10/21/2020 7:20:20 AM General Information Provided Yes Ardelle Anton Call Closed By: Ardelle Anton Transaction Date/Time: 10/21/2020 7:17:32 AM (ET)

## 2020-10-21 NOTE — Telephone Encounter (Signed)
I spoke with pt; symptoms started last night;pt coughed all night long, prod cough with clear phlegm, tight in chest; pt has been SOB but used inhaler and that has helped some, pt is also wheezing and has runny nose; pt took zyrtec;no fever,no chills or body aches, no h/A. Pt said she is requesting inhaler,abx and prednisone rx. Pt does not appear in any distress with breathing right now.Advised pt she needs to be seen for eval and possible CXR.pt voiced understanding and is going to [facility]. Sending note to DR Ermalene Searing and Lupita Leash CMA.

## 2020-12-16 ENCOUNTER — Ambulatory Visit: Payer: No Typology Code available for payment source | Admitting: Family Medicine

## 2021-03-11 ENCOUNTER — Ambulatory Visit: Payer: No Typology Code available for payment source | Admitting: Family Medicine
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
## 2021-04-25 LAB — HM DIABETES FOOT EXAM

## 2021-05-10 ENCOUNTER — Ambulatory Visit (INDEPENDENT_AMBULATORY_CARE_PROVIDER_SITE_OTHER): Payer: No Typology Code available for payment source | Admitting: Family Medicine

## 2021-05-10 ENCOUNTER — Other Ambulatory Visit: Payer: Self-pay

## 2021-05-10 VITALS — BP 128/82 | HR 78 | Temp 98.0°F | Resp 16 | Ht 61.5 in | Wt 214.2 lb

## 2021-05-10 DIAGNOSIS — F411 Generalized anxiety disorder: Secondary | ICD-10-CM

## 2021-05-10 DIAGNOSIS — E559 Vitamin D deficiency, unspecified: Secondary | ICD-10-CM

## 2021-05-10 DIAGNOSIS — E785 Hyperlipidemia, unspecified: Secondary | ICD-10-CM

## 2021-05-10 DIAGNOSIS — E1169 Type 2 diabetes mellitus with other specified complication: Secondary | ICD-10-CM | POA: Diagnosis not present

## 2021-05-10 DIAGNOSIS — F320 Major depressive disorder, single episode, mild: Secondary | ICD-10-CM

## 2021-05-10 DIAGNOSIS — E119 Type 2 diabetes mellitus without complications: Secondary | ICD-10-CM | POA: Diagnosis not present

## 2021-05-10 DIAGNOSIS — E661 Drug-induced obesity: Secondary | ICD-10-CM

## 2021-05-10 DIAGNOSIS — Z6839 Body mass index (BMI) 39.0-39.9, adult: Secondary | ICD-10-CM

## 2021-05-10 LAB — COMPREHENSIVE METABOLIC PANEL
ALT: 11 U/L (ref 0–35)
AST: 12 U/L (ref 0–37)
Albumin: 4 g/dL (ref 3.5–5.2)
Alkaline Phosphatase: 39 U/L (ref 39–117)
BUN: 12 mg/dL (ref 6–23)
CO2: 25 mEq/L (ref 19–32)
Calcium: 8.8 mg/dL (ref 8.4–10.5)
Chloride: 104 mEq/L (ref 96–112)
Creatinine, Ser: 0.83 mg/dL (ref 0.40–1.20)
GFR: 85.13 mL/min (ref >60.00)
Glucose, Bld: 112 mg/dL — ABNORMAL HIGH (ref 70–99)
Potassium: 4.2 mEq/L (ref 3.5–5.1)
Sodium: 136 mEq/L (ref 135–145)
Total Bilirubin: 0.2 mg/dL (ref 0.2–1.2)
Total Protein: 6.7 g/dL (ref 6.0–8.3)

## 2021-05-10 LAB — LIPID PANEL
Cholesterol: 155 mg/dL (ref 0–200)
HDL: 43 mg/dL (ref >39.00)
LDL Cholesterol: 76 mg/dL (ref 0–99)
NonHDL: 112.26
Total CHOL/HDL Ratio: 4
Triglycerides: 179 mg/dL — ABNORMAL HIGH (ref 0.0–149.0)
VLDL: 35.8 mg/dL (ref 0.0–40.0)

## 2021-05-10 LAB — VITAMIN D 25 HYDROXY (VIT D DEFICIENCY, FRACTURES): VITD: 11.49 ng/mL — ABNORMAL LOW (ref 30.00–100.00)

## 2021-05-10 LAB — HEMOGLOBIN A1C: Hgb A1c MFr Bld: 7.4 % — ABNORMAL HIGH (ref 4.6–6.5)

## 2021-05-10 MED ORDER — BACLOFEN 10 MG PO TABS: 10.0000 mg | ORAL_TABLET | Freq: Every day | ORAL | 0 refills | Status: DC | PRN

## 2021-05-10 MED ORDER — ALBUTEROL SULFATE HFA 108 (90 BASE) MCG/ACT IN AERS: 2.0000 | INHALATION_SPRAY | Freq: Four times a day (QID) | RESPIRATORY_TRACT | 1 refills | Status: DC | PRN

## 2021-05-10 NOTE — Assessment & Plan Note (Signed)
Chronic ? ?Due for reevaluation ?

## 2021-05-10 NOTE — Assessment & Plan Note (Signed)
Chronic, due for reevaluation 
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
## 2021-05-10 NOTE — Assessment & Plan Note (Addendum)
Chronic  ?due for reevaluation ?

## 2021-05-10 NOTE — Patient Instructions (Signed)
Get back on track with healthy eating, heart healthy diet. Find 10 minutes a day for a short walk or activity in sunshine. ? Call to set up appointment for counseling. ? Please stop at the lab to have labs drawn. ? ?

## 2021-05-10 NOTE — Assessment & Plan Note (Signed)
Acute ? ?Discussed treatment options in detail.  She is not interested in medication at this time.  I have referred her to counseling.  We discussed stress reduction and relaxation techniques.  She has communicated with her workplace regarding her feelings of her job being overwhelming.  It appears they have listened and have made adjustments for her. ?

## 2021-05-10 NOTE — Assessment & Plan Note (Signed)
Chronic, inadequate control ? ?She will get back on track with healthy eating and will work on regular exercise.  Currently her poor control of mood is contributing to difficulty and weight loss. ?

## 2021-05-17 ENCOUNTER — Telehealth: Payer: Self-pay

## 2021-05-17 ENCOUNTER — Telehealth: Payer: Self-pay | Admitting: Family Medicine

## 2021-05-17 ENCOUNTER — Encounter: Payer: Self-pay | Admitting: Family Medicine

## 2021-05-17 NOTE — Telephone Encounter (Signed)
? ?   Patient is returning call, hasn't looked on my chart, please call back ? ? ?05/17/2021 12:30 PM EDT Back to Top  ?  ?Left message for Ms. Basilio to return my call in regards to her lab results  ? Damita Lack, CMA  ?05/16/2021  9:33 AM EDT   ?  ?Left message for Ms. Christiana to return my call in regards to her lab results. I also advised she can also review  her results on her MyChart.   ? ?  ?

## 2021-05-17 NOTE — Telephone Encounter (Signed)
Replied to patient by MyChart.  If she does not respond in the next 24 hours please call with comments. ?

## 2021-05-17 NOTE — Telephone Encounter (Signed)
Pt called in and wanted to let Dr. Ermalene Searing know that her HR has been up over 100 consistently since she was here for her OV.  ?Also, pt states that she changed her mind and would like to get an RX for her anxiety.  ?Please advise  ?

## 2021-05-17 NOTE — Telephone Encounter (Signed)
Spoke with Otelia Santee and advised her lab results showed that your diabetes has worsened as expected, but her cholesterol remains at goal!  Keep working on getting back on track with healthy eating, low-carb diet and regular exercise as we discussed at today's appointment.  We will reevaluate the diabetes in 3 months.  Patient states understanding.  ?

## 2021-05-18 NOTE — Telephone Encounter (Signed)
I spoke with Robyn Barker and ask her to sign in to her MyChart to review the message Dr. Ermalene Searing sent to her last night.  She agreed to do that and will respond that way to Dr. Daphine Deutscher questions.  ?

## 2021-06-06 ENCOUNTER — Other Ambulatory Visit: Payer: Self-pay | Admitting: Family Medicine

## 2021-06-06 NOTE — Telephone Encounter (Signed)
Last office visit 05/10/2021 for DM.  Last refilled 05/10/2021 for #30 with no refills.  CPE scheduled 08/16/2021. ?

## 2021-06-14 ENCOUNTER — Ambulatory Visit (INDEPENDENT_AMBULATORY_CARE_PROVIDER_SITE_OTHER): Payer: No Typology Code available for payment source | Admitting: Family Medicine

## 2021-06-14 ENCOUNTER — Encounter: Payer: Self-pay | Admitting: Family Medicine

## 2021-06-14 VITALS — BP 128/70 | HR 87 | Temp 98.6°F | Resp 16 | Ht 61.5 in | Wt 209.5 lb

## 2021-06-14 DIAGNOSIS — F320 Major depressive disorder, single episode, mild: Secondary | ICD-10-CM | POA: Diagnosis not present

## 2021-06-14 DIAGNOSIS — F99 Mental disorder, not otherwise specified: Secondary | ICD-10-CM

## 2021-06-14 DIAGNOSIS — F410 Panic disorder [episodic paroxysmal anxiety] without agoraphobia: Secondary | ICD-10-CM

## 2021-06-14 DIAGNOSIS — F411 Generalized anxiety disorder: Secondary | ICD-10-CM | POA: Diagnosis not present

## 2021-06-14 DIAGNOSIS — R002 Palpitations: Secondary | ICD-10-CM | POA: Diagnosis not present

## 2021-06-14 DIAGNOSIS — F5105 Insomnia due to other mental disorder: Secondary | ICD-10-CM

## 2021-06-14 MED ORDER — SERTRALINE HCL 25 MG PO TABS: 25.0000 mg | ORAL_TABLET | Freq: Every day | ORAL | 3 refills | Status: DC

## 2021-06-14 MED ORDER — HYDROXYZINE HCL 10 MG PO TABS: 10.0000 mg | ORAL_TABLET | Freq: Every day | ORAL | 0 refills | Status: DC | PRN

## 2021-06-14 NOTE — Patient Instructions (Signed)
Can use hydroxyzine as needed daily for panic attack. ? Start sertraline 25 mg daily at bedtime. ? Call to set up counseling. ? Plan leave from work until 06/21/2021. ? ?

## 2021-06-14 NOTE — Progress Notes (Signed)
? ? Patient ID: Robyn Barker, female    DOB: 10-10-1975, 46 y.o.   MRN: [id number] ? ?This visit was conducted in person. ? ?  06/14/2021  ? 11:45 AM 05/10/2021  ?  8:47 AM 09/10/2020  ? 10:29 AM  ?Vitals with BMI  ?Height 5' 1.5" 5' 1.5" 5' 1.5"  ?Weight 209 lbs 8 oz 214 lbs 3 oz 210 lbs 12 oz  ?BMI 38.95 39.82 39.18  ?Systolic 287 681 92  ?Diastolic 70 82 66  ?Pulse 87 78 98  ?  ? ? ?CC: Palpitations and mood changes. ? ?Subjective:  ? ?HPI: ?Robyn Barker is a 46 y.o. female presenting on 06/14/2021 for  Palpitations and mood changes. ? ? She has noted recent  increase  heart rate off and on... associated with times of stress, panic. ? She has had increase in stress associated with work. Feels anxious in last several  months. Over-whelemd with responsibilities at work. ? Increased irritability. ? ?Trouble falling asleep, hard to calm down.  Waking frequently at night. ? Decreased energy, over eating. ?Trouble concentrating. Fidgity.  Excessive worry and fear. ? Having panic attacks daily at work... associated with palpitations and SOB.Marland Kitchen improves when calm down breathing. ? PHQ9:20 ?GAD7: 18 ? ?  06/14/2021  ? 12:23 PM 06/14/2021  ? 11:51 AM 05/10/2021  ?  9:21 AM  ?Depression screen PHQ 2/9  ?Decreased Interest 3 0 0  ?Down, Depressed, Hopeless 3  1  ?PHQ - 2 Score 6 0 1  ?Altered sleeping 3  1  ?Tired, decreased energy 3  2  ?Change in appetite 3  1  ?Feeling bad or failure about yourself  1  0  ?Trouble concentrating 2  0  ?Moving slowly or fidgety/restless 2  1  ?Suicidal thoughts 0  0  ?PHQ-9 Score 20  6  ?Difficult doing work/chores Very difficult  Somewhat difficult  ? ? ?  06/14/2021  ? 12:23 PM 05/10/2021  ?  9:22 AM  ?GAD 7 : Generalized Anxiety Score  ?Nervous, Anxious, on Edge 3 1  ?Control/stop worrying 3 3  ?Worry too much - different things 3 3  ?Trouble relaxing 3 2  ?Restless 2 2  ?Easily annoyed or irritable 3 2  ?Afraid - awful might happen 1 1  ?Total GAD 7 Score 18 14  ?Anxiety Difficulty Very  difficult Somewhat difficult  ? ? ? ? ? She does not drink enough water and drinks some caffeine. ? She feels it is due to increased stress , anxiety and depression. ? ?  She plans to call to set up counseling. ? ? ? ?   ? ?Relevant past medical, surgical, family and social history reviewed and updated as indicated. Interim medical history since our last visit reviewed. ?Allergies and medications reviewed and updated. ?Outpatient Medications Prior to Visit  ?Medication Sig Dispense Refill  ? albuterol (VENTOLIN HFA) 108 (90 Base) MCG/ACT inhaler Inhale 2 puffs into the lungs every 6 (six) hours as needed for wheezing or shortness of breath. 8 g 1  ? amitriptyline (ELAVIL) 25 MG tablet Take 25 mg by mouth at bedtime as needed for sleep. (Patient not taking: Reported on 09/10/2020)    ? atorvastatin (LIPITOR) 20 MG tablet Take 1 tablet (20 mg total) by mouth daily. (Patient not taking: Reported on 09/10/2020) 90 tablet 3  ? baclofen (LIORESAL) 10 MG tablet TAKE 1 TABLET BY MOUTH DAILY AS NEEDED FOR MUSCLE SPASMS. 30 tablet 0  ? blood glucose  meter kit and supplies KIT Dispense based on patient and insurance preference. Use up to four times daily as directed. (FOR ICD-9 250.00, 250.01). 1 each 0  ? Cholecalciferol (VITAMIN D3) 125 MCG (5000 UT) CAPS Take 1 capsule (5,000 Units total) by mouth daily. (Patient not taking: Reported on 09/10/2020) 30 capsule 95  ? ibuprofen (ADVIL) 800 MG tablet ibuprofen 800 mg tablet ? TAKE 1 TABLET BY MOUTH EVERY EIGHT HOURS AS NEEDED FOR PAIN. 30 tablet 3  ? triamcinolone cream (KENALOG) 0.5 % Apply 1 application topically 2 (two) times daily. 30 g 0  ? ?No facility-administered medications prior to visit.  ?  ? ?Per HPI unless specifically indicated in ROS section below ?Review of Systems  ?Constitutional:  Positive for fatigue. Negative for fever.  ?HENT:  Negative for congestion.   ?Eyes:  Negative for pain.  ?Respiratory:  Negative for cough and shortness of breath.   ?Cardiovascular:   Positive for palpitations. Negative for chest pain and leg swelling.  ?Gastrointestinal:  Negative for abdominal pain.  ?Genitourinary:  Negative for dysuria and vaginal bleeding.  ?Musculoskeletal:  Negative for back pain.  ?Neurological:  Negative for syncope, light-headedness and headaches.  ?Psychiatric/Behavioral:  Positive for agitation, dysphoric mood and sleep disturbance. Negative for hallucinations, self-injury and suicidal ideas. The patient is nervous/anxious.   ?Objective:  ?There were no vitals taken for this visit.  ?Wt Readings from Last 3 Encounters:  ?05/10/21 214 lb 3.2 oz (97.2 kg)  ?09/10/20 210 lb 12 oz (95.6 kg)  ?06/11/20 206 lb 12 oz (93.8 kg)  ?  ?  ?Physical Exam ?   ?Results for orders placed or performed in visit on 05/10/21  ?Hemoglobin A1c  ?Result Value Ref Range  ? Hgb A1c MFr Bld 7.4 (H) 4.6 - 6.5 %  ?Lipid panel  ?Result Value Ref Range  ? Cholesterol 155 0 - 200 mg/dL  ? Triglycerides 179.0 (H) 0.0 - 149.0 mg/dL  ? HDL 43.00 >39.00 mg/dL  ? VLDL 35.8 0.0 - 40.0 mg/dL  ? LDL Cholesterol 76 0 - 99 mg/dL  ? Total CHOL/HDL Ratio 4   ? NonHDL 112.26   ?Comprehensive metabolic panel  ?Result Value Ref Range  ? Sodium 136 135 - 145 mEq/L  ? Potassium 4.2 3.5 - 5.1 mEq/L  ? Chloride 104 96 - 112 mEq/L  ? CO2 25 19 - 32 mEq/L  ? Glucose, Bld 112 (H) 70 - 99 mg/dL  ? BUN 12 6 - 23 mg/dL  ? Creatinine, Ser 0.83 0.40 - 1.20 mg/dL  ? Total Bilirubin 0.2 0.2 - 1.2 mg/dL  ? Alkaline Phosphatase 39 39 - 117 U/L  ? AST 12 0 - 37 U/L  ? ALT 11 0 - 35 U/L  ? Total Protein 6.7 6.0 - 8.3 g/dL  ? Albumin 4.0 3.5 - 5.2 g/dL  ? GFR 85.13 >60.00 mL/min  ? Calcium 8.8 8.4 - 10.5 mg/dL  ?VITAMIN D 25 Hydroxy (Vit-D Deficiency, Fractures)  ?Result Value Ref Range  ? VITD 11.49 (L) 30.00 - 100.00 ng/mL  ?HM DIABETES FOOT EXAM  ?Result Value Ref Range  ? HM Diabetic Foot Exam done   ? ? ?This visit occurred during the SARS-CoV-2 public health emergency.  Safety protocols were in place, including screening  questions prior to the visit, additional usage of staff PPE, and extensive cleaning of exam room while observing appropriate contact time as indicated for disinfecting solutions.  ? ?COVID 19 screen:  No recent travel or known exposure  to [place] ?The patient denies respiratory symptoms of COVID 19 at this time. ?The importance of social distancing was discussed today.  ? ?Assessment and Plan ?Problem List Items Addressed This Visit   ? ? GAD (generalized anxiety disorder)  ? Relevant Medications  ? sertraline (ZOLOFT) 25 MG tablet  ? hydrOXYzine (ATARAX) 10 MG tablet  ? Insomnia due to other mental disorder  ? MDD (major depressive disorder), single episode, mild ([place])  ?  .ed for panic attacks.  We discussed working on stress reduction and relaxation techniques.  We will take her out of work until May 5 given possible side effects and time to get used to her new medications.  This will also give her time to start counseling.  Was given to patient for work. ? ?  ?  ? Relevant Medications  ? sertraline (ZOLOFT) 25 MG tablet  ? hydrOXYzine (ATARAX) 10 MG tablet  ? Palpitations - Primary  ?  Acute, Poor control  ?Recent lab evaluation has been normal, if this persists following improvement in anxiety we will move ahead with EKG and further lab evaluation. ?Urged her to increase her water intake and decrease or stop caffeine.  She will continue to avoid decongestants. ? ?  ?  ? Panic attacks  ? Relevant Medications  ? sertraline (ZOLOFT) 25 MG tablet  ? hydrOXYzine (ATARAX) 10 MG tablet  ? ?Meds ordered this encounter  ?Medications  ? sertraline (ZOLOFT) 25 MG tablet  ?  Sig: Take 1 tablet (25 mg total) by mouth daily.  ?  Dispense:  30 tablet  ?  Refill:  3  ? hydrOXYzine (ATARAX) 10 MG tablet  ?  Sig: Take 1 tablet (10 mg total) by mouth daily as needed for anxiety.  ?  Dispense:  30 tablet  ?  Refill:  0  ? ? ?  ? ?Eliezer Lofts, MD  ? ?

## 2021-06-15 DIAGNOSIS — F410 Panic disorder [episodic paroxysmal anxiety] without agoraphobia: Secondary | ICD-10-CM | POA: Insufficient documentation

## 2021-06-15 DIAGNOSIS — F5105 Insomnia due to other mental disorder: Secondary | ICD-10-CM | POA: Insufficient documentation

## 2021-06-15 NOTE — Assessment & Plan Note (Signed)
Acute, Poor control  ?Recent lab evaluation has been normal, if this persists following improvement in anxiety we will move ahead with EKG and further lab evaluation. ?Urged her to increase her water intake and decrease or stop caffeine.  She will continue to avoid decongestants. ?

## 2021-06-15 NOTE — Assessment & Plan Note (Signed)
.  ed for panic attacks.  We discussed working on stress reduction and relaxation techniques.  We will take her out of work until May 5 given possible side effects and time to get used to her new medications.  This will also give her time to start counseling.  Was given to patient for work. ?

## 2021-06-16 ENCOUNTER — Other Ambulatory Visit: Payer: Self-pay | Admitting: Family Medicine

## 2021-06-16 NOTE — Telephone Encounter (Signed)
Is this okay to refill ? ? ?06/11/20 last filled ?06/12/21 last ov ?07/12/21 next ov  ?

## 2021-07-12 ENCOUNTER — Ambulatory Visit: Payer: No Typology Code available for payment source | Admitting: Family Medicine

## 2021-07-15 ENCOUNTER — Encounter: Payer: Self-pay | Admitting: Family Medicine

## 2021-07-15 ENCOUNTER — Ambulatory Visit (INDEPENDENT_AMBULATORY_CARE_PROVIDER_SITE_OTHER): Payer: No Typology Code available for payment source | Admitting: Family Medicine

## 2021-07-15 VITALS — BP 90/60 | HR 95 | Temp 98.7°F | Ht 61.5 in | Wt 208.6 lb

## 2021-07-15 DIAGNOSIS — Z1231 Encounter for screening mammogram for malignant neoplasm of breast: Secondary | ICD-10-CM

## 2021-07-15 DIAGNOSIS — F320 Major depressive disorder, single episode, mild: Secondary | ICD-10-CM

## 2021-07-15 DIAGNOSIS — F411 Generalized anxiety disorder: Secondary | ICD-10-CM

## 2021-07-15 MED ORDER — VENLAFAXINE HCL ER 37.5 MG PO CP24: 37.5000 mg | ORAL_CAPSULE | Freq: Every day | ORAL | 3 refills | Status: DC

## 2021-07-15 NOTE — Progress Notes (Signed)
Patient ID: Robyn Barker, female    DOB: [birth year], 46 y.o.   MRN: [id number]  This visit was conducted in person.  BP 90/60   Pulse 95   Temp 98.7 F (37.1 C) (Oral)   Ht 5' 1.5" (1.562 m)   Wt 208 lb 9 oz (94.6 kg)   LMP 07/10/2021   SpO2 98%   BMI 38.77 kg/m    CC:  Chief Complaint  Patient presents with   Follow-up    Mood    Subjective:   HPI: Robyn Barker is a 46 y.o. female presenting on 07/15/2021 for Follow-up (Mood)   At last OV treated  for GAD, MDD and chronic insomnia Recommended stress reduction, relaxation. Started on sertraline 25 mg daily and hydroxyzine    She took int for 5 days .. cause severe headache.  She has had less panic attacks... has not needed hydroxyzine.   PHQ9: 3  GAD7: 6  Work has  decreased excessive work load. She has been back  for 2-3 weeks. She is handling stress better.   Palpitations at last OV felt to likely be due to poor control MDD, GAD  She has been increasing water  and decreasing.    Relevant past medical, surgical, family and social history reviewed and updated as indicated. Interim medical history since our last visit reviewed. Allergies and medications reviewed and updated. Outpatient Medications Prior to Visit  Medication Sig Dispense Refill   albuterol (VENTOLIN HFA) 108 (90 Base) MCG/ACT inhaler Inhale 2 puffs into the lungs every 6 (six) hours as needed for wheezing or shortness of breath. 8 g 1   baclofen (LIORESAL) 10 MG tablet TAKE 1 TABLET BY MOUTH DAILY AS NEEDED FOR MUSCLE SPASMS. 30 tablet 0   blood glucose meter kit and supplies KIT Dispense based on patient and insurance preference. Use up to four times daily as directed. (FOR ICD-9 250.00, 250.01). 1 each 0   Cholecalciferol (VITAMIN D3) 125 MCG (5000 UT) CAPS Take 1 capsule (5,000 Units total) by mouth daily. 30 capsule 95   hydrOXYzine (ATARAX) 10 MG tablet Take 1 tablet (10 mg total) by mouth daily as needed for anxiety. 30 tablet 0    ibuprofen (ADVIL) 800 MG tablet TAKE 1 TABLET BY MOUTH EVERY 8 HOURS AS NEEDED FOR PAIN 30 tablet 3   sertraline (ZOLOFT) 25 MG tablet Take 1 tablet (25 mg total) by mouth daily. 30 tablet 3   triamcinolone cream (KENALOG) 0.5 % Apply 1 application topically 2 (two) times daily. 30 g 0   No facility-administered medications prior to visit.     Per HPI unless specifically indicated in ROS section below Review of Systems  Constitutional:  Negative for fatigue and fever.  HENT:  Negative for congestion.   Eyes:  Negative for pain.  Respiratory:  Negative for cough and shortness of breath.   Cardiovascular:  Negative for chest pain, palpitations and leg swelling.  Gastrointestinal:  Negative for abdominal pain.  Genitourinary:  Negative for dysuria and vaginal bleeding.  Musculoskeletal:  Negative for back pain.  Neurological:  Negative for syncope, light-headedness and headaches.  Psychiatric/Behavioral:  Negative for dysphoric mood.   Objective:  BP 90/60   Pulse 95   Temp 98.7 F (37.1 C) (Oral)   Ht 5' 1.5" (1.562 m)   Wt 208 lb 9 oz (94.6 kg)   LMP 07/10/2021   SpO2 98%   BMI 38.77 kg/m   Wt Readings from Last 3 Encounters:  07/15/21 208 lb 9 oz (94.6 kg)  06/14/21 209 lb 8 oz (95 kg)  05/10/21 214 lb 3.2 oz (97.2 kg)      Physical Exam Constitutional:      General: She is not in acute distress.    Appearance: Normal appearance. She is well-developed. She is not ill-appearing or toxic-appearing.  HENT:     Head: Normocephalic.     Right Ear: Hearing, tympanic membrane, ear canal and external ear normal. Tympanic membrane is not erythematous, retracted or bulging.     Left Ear: Hearing, tympanic membrane, ear canal and external ear normal. Tympanic membrane is not erythematous, retracted or bulging.     Nose: No mucosal edema or rhinorrhea.     Right Sinus: No maxillary sinus tenderness or frontal sinus tenderness.     Left Sinus: No maxillary sinus tenderness or  frontal sinus tenderness.     Mouth/Throat:     Pharynx: Uvula midline.  Eyes:     General: Lids are normal. Lids are everted, no foreign bodies appreciated.     Conjunctiva/sclera: Conjunctivae normal.     Pupils: Pupils are equal, round, and reactive to light.  Neck:     Thyroid: No thyroid mass or thyromegaly.     Vascular: No carotid bruit.     Trachea: Trachea normal.  Cardiovascular:     Rate and Rhythm: Normal rate and regular rhythm.     Pulses: Normal pulses.     Heart sounds: Normal heart sounds, S1 normal and S2 normal. No murmur heard.   No friction rub. No gallop.  Pulmonary:     Effort: Pulmonary effort is normal. No tachypnea or respiratory distress.     Breath sounds: Normal breath sounds. No decreased breath sounds, wheezing, rhonchi or rales.  Abdominal:     General: Bowel sounds are normal.     Palpations: Abdomen is soft.     Tenderness: There is no abdominal tenderness.  Musculoskeletal:     Cervical back: Normal range of motion and neck supple.  Skin:    General: Skin is warm and dry.     Findings: No rash.  Neurological:     Mental Status: She is alert.  Psychiatric:        Mood and Affect: Mood is not anxious or depressed.        Speech: Speech normal.        Behavior: Behavior normal. Behavior is cooperative.        Thought Content: Thought content normal.        Judgment: Judgment normal.      Results for orders placed or performed in visit on 05/10/21  Hemoglobin A1c  Result Value Ref Range   Hgb A1c MFr Bld 7.4 (H) 4.6 - 6.5 %  Lipid panel  Result Value Ref Range   Cholesterol 155 0 - 200 mg/dL   Triglycerides 179.0 (H) 0.0 - 149.0 mg/dL   HDL 43.00 >39.00 mg/dL   VLDL 35.8 0.0 - 40.0 mg/dL   LDL Cholesterol 76 0 - 99 mg/dL   Total CHOL/HDL Ratio 4    NonHDL 112.26   Comprehensive metabolic panel  Result Value Ref Range   Sodium 136 135 - 145 mEq/L   Potassium 4.2 3.5 - 5.1 mEq/L   Chloride 104 96 - 112 mEq/L   CO2 25 19 - 32 mEq/L    Glucose, Bld 112 (H) 70 - 99 mg/dL   BUN 12 6 - 23 mg/dL   Creatinine, Ser  0.83 0.40 - 1.20 mg/dL   Total Bilirubin 0.2 0.2 - 1.2 mg/dL   Alkaline Phosphatase 39 39 - 117 U/L   AST 12 0 - 37 U/L   ALT 11 0 - 35 U/L   Total Protein 6.7 6.0 - 8.3 g/dL   Albumin 4.0 3.5 - 5.2 g/dL   GFR 85.13 >60.00 mL/min   Calcium 8.8 8.4 - 10.5 mg/dL  VITAMIN D 25 Hydroxy (Vit-D Deficiency, Fractures)  Result Value Ref Range   VITD 11.49 (L) 30.00 - 100.00 ng/mL  HM DIABETES FOOT EXAM  Result Value Ref Range   HM Diabetic Foot Exam done      COVID 19 screen:  No recent travel or known exposure to COVID19 The patient denies respiratory symptoms of COVID 19 at this time. The importance of social distancing was discussed today.   Assessment and Plan    Problem List Items Addressed This Visit     GAD (generalized anxiety disorder)    Chronic, improved control  She has not had to use the hydroxyzine for panic attacks.       Relevant Medications   venlafaxine XR (EFFEXOR XR) 37.5 MG 24 hr capsule   MDD (major depressive disorder), single episode, mild (HCC)    Chronic, improved control  She has had significant improvement despite not tolerating sertraline.  She has not had to use hydroxyzine yet.  Her work has decreased her previously overwhelming schedule and this is helped tremendously.  She feels she has more doable expectations.  She does still feel that she has some improvement needed for her overall mood.  She would like to try different medication.  We will start venlafaxine low-dose 37.5 mg daily if she tolerates this she can increase it to 75 mg daily after a week.  She states today that she will probably stay at the lower dose given her feelings about taking medication. We will follow-up in 1 month for repeat evaluation.  No suicidal ideation or homicidal ideation       Relevant Medications   venlafaxine XR (EFFEXOR XR) 37.5 MG 24 hr capsule   Other Visit Diagnoses      Encounter for screening mammogram for malignant neoplasm of breast    -  Primary   Relevant Orders   MM DIGITAL SCREENING BILATERAL      Meds ordered this encounter  Medications   venlafaxine XR (EFFEXOR XR) 37.5 MG 24 hr capsule    Sig: Take 1 capsule (37.5 mg total) by mouth daily with breakfast.    Dispense:  30 capsule    Refill:  3     Eliezer Lofts, MD

## 2021-07-15 NOTE — Assessment & Plan Note (Signed)
Chronic, improved control  She has not had to use the hydroxyzine for panic attacks.

## 2021-07-15 NOTE — Assessment & Plan Note (Signed)
Chronic, improved control  She has had significant improvement despite not tolerating sertraline.  She has not had to use hydroxyzine yet.  Her work has decreased her previously overwhelming schedule and this is helped tremendously.  She feels she has more doable expectations.  She does still feel that she has some improvement needed for her overall mood.  She would like to try different medication.  We will start venlafaxine low-dose 37.5 mg daily if she tolerates this she can increase it to 75 mg daily after a week.  She states today that she will probably stay at the lower dose given her feelings about taking medication. We will follow-up in 1 month for repeat evaluation.  No suicidal ideation or homicidal ideation

## 2021-07-15 NOTE — Patient Instructions (Signed)
Start venlafaxine 37.5 mg daily.  Can use hydroxyzine prn panic attacks.

## 2021-07-25 ENCOUNTER — Telehealth: Payer: Self-pay | Admitting: Family Medicine

## 2021-07-25 DIAGNOSIS — E119 Type 2 diabetes mellitus without complications: Secondary | ICD-10-CM

## 2021-07-25 DIAGNOSIS — E559 Vitamin D deficiency, unspecified: Secondary | ICD-10-CM

## 2021-07-25 NOTE — Telephone Encounter (Signed)
-----   Message from Alvina Chou sent at 07/20/2021  3:37 PM EDT ----- Regarding: Lab orders for Friday 6.16.23 Patient is scheduled for CPX labs, please order future labs, Thanks , Camelia Eng

## 2021-08-05 ENCOUNTER — Other Ambulatory Visit: Payer: No Typology Code available for payment source

## 2021-08-10 ENCOUNTER — Other Ambulatory Visit: Payer: Self-pay | Admitting: Family Medicine

## 2021-08-10 NOTE — Telephone Encounter (Signed)
Last office visit 07/15/21 follow up mood.  Last refilled 06/14/21 for #30 with no refills.  CPE scheduled 08/16/2021.

## 2021-08-16 ENCOUNTER — Ambulatory Visit (INDEPENDENT_AMBULATORY_CARE_PROVIDER_SITE_OTHER): Payer: No Typology Code available for payment source | Admitting: Family Medicine

## 2021-08-16 ENCOUNTER — Encounter: Payer: Self-pay | Admitting: Family Medicine

## 2021-08-16 VITALS — BP 116/68 | HR 72 | Temp 98.4°F | Ht 61.5 in | Wt 210.6 lb

## 2021-08-16 DIAGNOSIS — Z1211 Encounter for screening for malignant neoplasm of colon: Secondary | ICD-10-CM

## 2021-08-16 DIAGNOSIS — Z0000 Encounter for general adult medical examination without abnormal findings: Secondary | ICD-10-CM | POA: Diagnosis not present

## 2021-08-16 DIAGNOSIS — E559 Vitamin D deficiency, unspecified: Secondary | ICD-10-CM

## 2021-08-16 DIAGNOSIS — E119 Type 2 diabetes mellitus without complications: Secondary | ICD-10-CM | POA: Diagnosis not present

## 2021-08-16 DIAGNOSIS — E782 Mixed hyperlipidemia: Secondary | ICD-10-CM | POA: Diagnosis not present

## 2021-08-16 DIAGNOSIS — H6121 Impacted cerumen, right ear: Secondary | ICD-10-CM | POA: Insufficient documentation

## 2021-08-16 LAB — LIPID PANEL
Cholesterol: 169 mg/dL (ref 0–200)
HDL: 38.7 mg/dL — ABNORMAL LOW (ref >39.00)
NonHDL: 130.02
Total CHOL/HDL Ratio: 4
Triglycerides: 201 mg/dL — ABNORMAL HIGH (ref 0.0–149.0)
VLDL: 40.2 mg/dL — ABNORMAL HIGH (ref 0.0–40.0)

## 2021-08-16 LAB — MICROALBUMIN / CREATININE URINE RATIO
Creatinine,U: 169.5 mg/dL
Microalb Creat Ratio: 0.7 mg/g (ref 0.0–30.0)
Microalb, Ur: 1.1 mg/dL (ref 0.0–1.9)

## 2021-08-16 LAB — COMPREHENSIVE METABOLIC PANEL
ALT: 12 U/L (ref 0–35)
AST: 12 U/L (ref 0–37)
Albumin: 3.9 g/dL (ref 3.5–5.2)
Alkaline Phosphatase: 38 U/L — ABNORMAL LOW (ref 39–117)
BUN: 10 mg/dL (ref 6–23)
CO2: 25 mEq/L (ref 19–32)
Calcium: 9.3 mg/dL (ref 8.4–10.5)
Chloride: 104 mEq/L (ref 96–112)
Creatinine, Ser: 0.83 mg/dL (ref 0.40–1.20)
GFR: 84.97 mL/min (ref >60.00)
Glucose, Bld: 120 mg/dL — ABNORMAL HIGH (ref 70–99)
Potassium: 4.1 mEq/L (ref 3.5–5.1)
Sodium: 138 mEq/L (ref 135–145)
Total Bilirubin: 0.3 mg/dL (ref 0.2–1.2)
Total Protein: 6.4 g/dL (ref 6.0–8.3)

## 2021-08-16 LAB — HEMOGLOBIN A1C: Hgb A1c MFr Bld: 7.3 % — ABNORMAL HIGH (ref 4.6–6.5)

## 2021-08-16 LAB — VITAMIN D 25 HYDROXY (VIT D DEFICIENCY, FRACTURES): VITD: 13.45 ng/mL — ABNORMAL LOW (ref 30.00–100.00)

## 2021-08-16 LAB — LDL CHOLESTEROL, DIRECT: Direct LDL: 100 mg/dL

## 2021-08-16 MED ORDER — VITAMIN D (ERGOCALCIFEROL) 1.25 MG (50000 UNIT) PO CAPS: 50000.0000 [IU] | ORAL_CAPSULE | ORAL | 0 refills | Status: DC

## 2021-08-16 NOTE — Progress Notes (Signed)
Patient ID: Robyn Barker, female    DOB: February 18, 1976, 46 y.o.   MRN: [id number]  This visit was conducted in person.  BP 116/68   Pulse 72   Temp 98.4 F (36.9 C) (Oral)   Ht 5' 1.5" (1.562 m)   Wt 210 lb 9 oz (95.5 kg)   LMP 08/02/2021   SpO2 95%   BMI 39.14 kg/m    CC:  Chief Complaint  Patient presents with   Annual Exam    Subjective:   HPI: [name] is a 46 y.o. female presenting on 08/16/2021 for Annual Exam   Diabetes:  Due for re-eval. Lab Results  Component Value Date   HGBA1C 7.4 (H) 05/10/2021  Using medications without difficulties: Hypoglycemic episodes: Hyperglycemic episodes: Feet problems: no ulcers Blood Sugars averaging: not checking eye exam within last year:  yes.Marland Kitchen scheduled  Elevated Cholesterol: Due for re-eval Using medications without problems: Muscle aches:  Diet compliance: Exercise: Other complaints:   Obesity Body mass index is 39.14 kg/m. Wt Readings from Last 3 Encounters:  08/16/21 210 lb 9 oz (95.5 kg)  07/15/21 208 lb 9 oz (94.6 kg)  06/14/21 209 lb 8 oz (95 kg)         Relevant past medical, surgical, family and social history reviewed and updated as indicated. Interim medical history since our last visit reviewed. Allergies and medications reviewed and updated. Outpatient Medications Prior to Visit  Medication Sig Dispense Refill   albuterol (VENTOLIN HFA) 108 (90 Base) MCG/ACT inhaler Inhale 2 puffs into the lungs every 6 (six) hours as needed for wheezing or shortness of breath. 8 g 1   baclofen (LIORESAL) 10 MG tablet TAKE 1 TABLET BY MOUTH DAILY AS NEEDED FOR MUSCLE SPASMS. 30 tablet 0   blood glucose meter kit and supplies KIT Dispense based on patient and insurance preference. Use up to four times daily as directed. (FOR ICD-9 250.00, 250.01). 1 each 0   Cholecalciferol (VITAMIN D3) 125 MCG (5000 UT) CAPS Take 1 capsule (5,000 Units total) by mouth daily. 30 capsule 95   hydrOXYzine (ATARAX) 10 MG  tablet TAKE 1 TABLET BY MOUTH DAILY AS NEEDED FOR ANXIETY. 30 tablet 0   ibuprofen (ADVIL) 800 MG tablet TAKE 1 TABLET BY MOUTH EVERY 8 HOURS AS NEEDED FOR PAIN 30 tablet 3   venlafaxine XR (EFFEXOR XR) 37.5 MG 24 hr capsule Take 1 capsule (37.5 mg total) by mouth daily with breakfast. 30 capsule 3   triamcinolone cream (KENALOG) 0.5 % Apply 1 application topically 2 (two) times daily. 30 g 0   No facility-administered medications prior to visit.     Per HPI unless specifically indicated in ROS section below Review of Systems  Constitutional:  Negative for fatigue and fever.  HENT:  Negative for congestion.   Eyes:  Negative for pain.  Respiratory:  Negative for cough and shortness of breath.   Cardiovascular:  Negative for chest pain, palpitations and leg swelling.  Gastrointestinal:  Negative for abdominal pain.  Genitourinary:  Negative for dysuria and vaginal bleeding.  Musculoskeletal:  Negative for back pain.  Neurological:  Negative for syncope, light-headedness and headaches.  Psychiatric/Behavioral:  Negative for dysphoric mood.    Objective:  BP 116/68   Pulse 72   Temp 98.4 F (36.9 C) (Oral)   Ht 5' 1.5" (1.562 m)   Wt 210 lb 9 oz (95.5 kg)   LMP 08/02/2021   SpO2 95%   BMI 39.14 kg/m  Wt Readings from Last 3 Encounters:  08/16/21 210 lb 9 oz (95.5 kg)  07/15/21 208 lb 9 oz (94.6 kg)  06/14/21 209 lb 8 oz (95 kg)      Physical Exam Constitutional:      General: She is not in acute distress.    Appearance: Normal appearance. She is well-developed. She is not ill-appearing or toxic-appearing.  HENT:     Head: Normocephalic.     Right Ear: Hearing, tympanic membrane, ear canal and external ear normal. There is impacted cerumen. Tympanic membrane is not erythematous, retracted or bulging.     Left Ear: Hearing, tympanic membrane, ear canal and external ear normal. There is impacted cerumen. Tympanic membrane is not erythematous, retracted or bulging.     Ears:      Comments: TMs clear following cerumen impaction removal    Nose: No mucosal edema or rhinorrhea.     Right Sinus: No maxillary sinus tenderness or frontal sinus tenderness.     Left Sinus: No maxillary sinus tenderness or frontal sinus tenderness.     Mouth/Throat:     Pharynx: Uvula midline.  Eyes:     General: Lids are normal. Lids are everted, no foreign bodies appreciated.     Conjunctiva/sclera: Conjunctivae normal.     Pupils: Pupils are equal, round, and reactive to light.  Neck:     Thyroid: No thyroid mass or thyromegaly.     Vascular: No carotid bruit.     Trachea: Trachea normal.  Cardiovascular:     Rate and Rhythm: Normal rate and regular rhythm.     Pulses: Normal pulses.     Heart sounds: Normal heart sounds, S1 normal and S2 normal. No murmur heard.    No friction rub. No gallop.  Pulmonary:     Effort: Pulmonary effort is normal. No tachypnea or respiratory distress.     Breath sounds: Normal breath sounds. No decreased breath sounds, wheezing, rhonchi or rales.  Abdominal:     General: Bowel sounds are normal.     Palpations: Abdomen is soft.     Tenderness: There is no abdominal tenderness.  Musculoskeletal:     Cervical back: Normal range of motion and neck supple.  Skin:    General: Skin is warm and dry.     Findings: No rash.  Neurological:     Mental Status: She is alert.  Psychiatric:        Mood and Affect: Mood is not anxious or depressed.        Speech: Speech normal.        Behavior: Behavior normal. Behavior is cooperative.        Thought Content: Thought content normal.        Judgment: Judgment normal.       Results for orders placed or performed in visit on 05/10/21  Hemoglobin A1c  Result Value Ref Range   Hgb A1c MFr Bld 7.4 (H) 4.6 - 6.5 %  Lipid panel  Result Value Ref Range   Cholesterol 155 0 - 200 mg/dL   Triglycerides 179.0 (H) 0.0 - 149.0 mg/dL   HDL 43.00 >39.00 mg/dL   VLDL 35.8 0.0 - 40.0 mg/dL   LDL Cholesterol 76 0  - 99 mg/dL   Total CHOL/HDL Ratio 4    NonHDL 112.26   Comprehensive metabolic panel  Result Value Ref Range   Sodium 136 135 - 145 mEq/L   Potassium 4.2 3.5 - 5.1 mEq/L   Chloride 104 96 -  112 mEq/L   CO2 25 19 - 32 mEq/L   Glucose, Bld 112 (H) 70 - 99 mg/dL   BUN 12 6 - 23 mg/dL   Creatinine, Ser 0.83 0.40 - 1.20 mg/dL   Total Bilirubin 0.2 0.2 - 1.2 mg/dL   Alkaline Phosphatase 39 39 - 117 U/L   AST 12 0 - 37 U/L   ALT 11 0 - 35 U/L   Total Protein 6.7 6.0 - 8.3 g/dL   Albumin 4.0 3.5 - 5.2 g/dL   GFR 85.13 >60.00 mL/min   Calcium 8.8 8.4 - 10.5 mg/dL  VITAMIN D 25 Hydroxy (Vit-D Deficiency, Fractures)  Result Value Ref Range   VITD 11.49 (L) 30.00 - 100.00 ng/mL  HM DIABETES FOOT EXAM  Result Value Ref Range   HM Diabetic Foot Exam done      COVID 19 screen:  No recent travel or known exposure to COVID19 The patient denies respiratory symptoms of COVID 19 at this time. The importance of social distancing was discussed today.   Assessment and Plan   The patient's preventative maintenance and recommended screening tests for an annual wellness exam were reviewed in full today. Brought up to date unless services declined.  Counselled on the importance of diet, exercise, and its role in overall health and mortality. The patient's FH and SH was reviewed, including their home life, tobacco status, and drug and alcohol status.   Vaccines: Discussed COVID, refused. OTW Uptodate Pap/DVE:  2020 neg Pap and HPV Mammo:  pending Bone Density: not indicated Colon:  referral for colonoscopy placed. Smoking Status:  nonsmoker.Marland Kitchen occ vaping ETOH/ drug use: none.none  Urine microglobulin pending  Problem List Items Addressed This Visit     Controlled type 2 diabetes mellitus without complication, without long-term current use of insulin ([place])   Impacted cerumen of right ear    Cerumen impaction removal via irrigation Performed by :  Hedy Camara CMA Consent for procedure  obtained verbally. Bilateral ears lavaged/ irrigated with warm water gently. Pt tolerated procedure well, with no complications. After procedure ears clear of cerumen impaction, with minimal ear canal irritation and redness, no bleeding. TM intact and symptoms improved.        Vitamin D deficiency   Other Visit Diagnoses     Routine general medical examination at a health care facility    -  Primary   Mixed hyperlipidemia       Colon cancer screening       Relevant Orders   Ambulatory referral to Gastroenterology      Meds ordered this encounter  Medications   Vitamin D, Ergocalciferol, (DRISDOL) 1.25 MG (50000 UNIT) CAPS capsule    Sig: Take 1 capsule (50,000 Units total) by mouth every 7 (seven) days.    Dispense:  12 capsule    Refill:  0   Orders Placed This Encounter  Procedures   LDL cholesterol, direct   Ambulatory referral to Gastroenterology    Referral Priority:   Routine    Referral Type:   Consultation    Referral Reason:   Specialty Services Required    Number of Visits Requested:   1      Eliezer Lofts, MD

## 2021-08-19 ENCOUNTER — Ambulatory Visit: Payer: No Typology Code available for payment source | Admitting: Family Medicine

## 2021-09-01 ENCOUNTER — Other Ambulatory Visit: Payer: Self-pay | Admitting: Family Medicine

## 2021-11-10 ENCOUNTER — Encounter: Payer: Self-pay | Admitting: Gastroenterology

## 2021-11-10 ENCOUNTER — Ambulatory Visit (AMBULATORY_SURGERY_CENTER): Payer: No Typology Code available for payment source

## 2021-11-10 VITALS — Ht 61.5 in | Wt 211.0 lb

## 2021-11-10 DIAGNOSIS — Z1211 Encounter for screening for malignant neoplasm of colon: Secondary | ICD-10-CM

## 2021-11-10 MED ORDER — NA SULFATE-K SULFATE-MG SULF 17.5-3.13-1.6 GM/177ML PO SOLN: 1.0000 | Freq: Once | ORAL | 0 refills | Status: AC

## 2021-11-10 NOTE — Progress Notes (Signed)
No egg or soy allergy known to patient  No issues known to pt with past sedation with any surgeries or procedures Patient denies ever being told they had issues or difficulty with intubation  No FH of Malignant Hyperthermia Pt is not on diet pills Pt is not on home 02  Pt is not on blood thinners  Pt denies issues with constipation  No A fib or A flutter Have any cardiac testing pending--NO Pt instructed to use [email or web address] or GoodRx for a price reduction on prep  [employer] card given to patient during PV appt;

## 2021-11-14 ENCOUNTER — Ambulatory Visit (AMBULATORY_SURGERY_CENTER): Payer: No Typology Code available for payment source | Admitting: Gastroenterology

## 2021-11-14 ENCOUNTER — Encounter: Payer: Self-pay | Admitting: Gastroenterology

## 2021-11-14 VITALS — BP 101/72 | HR 80 | Temp 98.8°F | Resp 17 | Ht 61.5 in | Wt 211.0 lb

## 2021-11-14 DIAGNOSIS — Z1211 Encounter for screening for malignant neoplasm of colon: Secondary | ICD-10-CM

## 2021-11-14 MED ORDER — SODIUM CHLORIDE 0.9 % IV SOLN: 500.0000 mL | Freq: Once | INTRAVENOUS | Status: DC

## 2021-11-14 NOTE — Patient Instructions (Signed)
   Handout on diverticulosis given to you today    YOU HAD AN ENDOSCOPIC PROCEDURE TODAY AT THE [facility]:   Refer to the procedure report that was given to you for any specific questions about what was found during the examination.  If the procedure report does not answer your questions, please call your gastroenterologist to clarify.  If you requested that your care partner not be given the details of your procedure findings, then the procedure report has been included in a sealed envelope for you to review at your convenience later.  YOU SHOULD EXPECT: Some feelings of bloating in the abdomen. Passage of more gas than usual.  Walking can help get rid of the air that was put into your GI tract during the procedure and reduce the bloating. If you had a lower endoscopy (such as a colonoscopy or flexible sigmoidoscopy) you may notice spotting of blood in your stool or on the toilet paper. If you underwent a bowel prep for your procedure, you may not have a normal bowel movement for a few days.  Please Note:  You might notice some irritation and congestion in your nose or some drainage.  This is from the oxygen used during your procedure.  There is no need for concern and it should clear up in a day or so.  SYMPTOMS TO REPORT IMMEDIATELY:  Following lower endoscopy (colonoscopy or flexible sigmoidoscopy):  Excessive amounts of blood in the stool  Significant tenderness or worsening of abdominal pains  Swelling of the abdomen that is new, acute  Fever of 100F or higher   For urgent or emergent issues, a gastroenterologist can be reached at any hour by calling [phone]. Do not use MyChart messaging for urgent concerns.    DIET:  We do recommend a small meal at first, but then you may proceed to your regular diet.  Drink plenty of fluids but you should avoid alcoholic beverages for 24 hours.  ACTIVITY:  You should plan to take it easy for the rest of today and you should  NOT DRIVE or use heavy machinery until tomorrow (because of the sedation medicines used during the test).    FOLLOW UP: Our staff will call the number listed on your records the next business day following your procedure.  We will call around 7:15- 8:00 am to check on you and address any questions or concerns that you may have regarding the information given to you following your procedure. If we do not reach you, we will leave a message.     If any biopsies were taken you will be contacted by phone or by letter within the next 1-3 weeks.  Please call us at [phone] if you have not heard about the biopsies in 3 weeks.    SIGNATURES/CONFIDENTIALITY: You and/or your care partner have signed paperwork which will be entered into your electronic medical record.  These signatures attest to the fact that that the information above on your After Visit Summary has been reviewed and is understood.  Full responsibility of the confidentiality of this discharge information lies with you and/or your care-partner. 

## 2021-11-14 NOTE — Progress Notes (Signed)
[facility] History and Physical   Primary Care Physician:  Jinny Sanders, MD   Reason for Procedure:   Colon cancer screening  Plan:    Screening colonoscopy     HPI: Robyn Barker is a 46 y.o. female undergoing initial average risk screening colonoscopy.  She has no family history of colon cancer and no chronic GI symptoms.    Past Medical History:  Diagnosis Date   Anxiety    on meds   Asthma    inhaler PRN   Depression    on meds   Ectopic pregnancy 05/20/2016   Genital herpes    High cholesterol    diet controlled- no meds   Seasonal allergies     Past Surgical History:  Procedure Laterality Date   DIAGNOSTIC LAPAROSCOPY WITH REMOVAL OF ECTOPIC PREGNANCY N/A 05/15/2015   Procedure: DIAGNOSTIC LAPAROSCOPY WITH REMOVAL OF ECTOPIC PREGNANCY;  Surgeon: Donnamae Jude, MD;  Location: [facility];  Service: Gynecology;  Laterality: N/A;   DIAGNOSTIC LAPAROSCOPY WITH REMOVAL OF ECTOPIC PREGNANCY Bilateral 05/20/2016   Procedure: DIAGNOSTIC LAPAROSCOPY;  Surgeon: Osborne Oman, MD;  Location: [facility];  Service: Gynecology;  Laterality: Bilateral;   OVARIAN CYST REMOVAL  2003   SALPINGECTOMY Right 05/15/2015   UNILATERAL SALPINGECTOMY Left 05/20/2016   Procedure: UNILATERAL SALPINGECTOMY;  Surgeon: Osborne Oman, MD;  Location: [facility];  Service: Gynecology;  Laterality: Left;   WISDOM TOOTH EXTRACTION      Prior to Admission medications   Medication Sig Start Date End Date Taking? Authorizing Provider  albuterol (VENTOLIN HFA) 108 (90 Base) MCG/ACT inhaler INHALE 2 PUFFS BY MOUTH EVERY 6 HOURS AS NEEDED FOR WHEEZE OR SHORTNESS OF BREATH 09/01/21  Yes Bedsole, Amy E, MD  baclofen (LIORESAL) 10 MG tablet TAKE 1 TABLET BY MOUTH DAILY AS NEEDED FOR MUSCLE SPASMS. 06/07/21   Bedsole, Amy E, MD  blood glucose meter kit and supplies KIT Dispense based on patient and insurance preference. Use up to four times daily as directed. (FOR ICD-9 250.00, 250.01). Patient  not taking: Reported on 11/10/2021 09/22/19   Elby Beck, FNP  hydrOXYzine (ATARAX) 10 MG tablet TAKE 1 TABLET BY MOUTH DAILY AS NEEDED FOR ANXIETY. Patient not taking: Reported on 11/10/2021 08/15/21   Jinny Sanders, MD  ibuprofen (ADVIL) 800 MG tablet TAKE 1 TABLET BY MOUTH EVERY 8 HOURS AS NEEDED FOR PAIN 06/16/21   Jinny Sanders, MD  venlafaxine XR (EFFEXOR XR) 37.5 MG 24 hr capsule Take 1 capsule (37.5 mg total) by mouth daily with breakfast. Patient not taking: Reported on 11/14/2021 07/15/21   Jinny Sanders, MD  Vitamin D, Ergocalciferol, (DRISDOL) 1.25 MG (50000 UNIT) CAPS capsule Take 1 capsule (50,000 Units total) by mouth every 7 (seven) days. Patient not taking: Reported on 11/14/2021 08/16/21   Jinny Sanders, MD    Current Outpatient Medications  Medication Sig Dispense Refill   albuterol (VENTOLIN HFA) 108 (90 Base) MCG/ACT inhaler INHALE 2 PUFFS BY MOUTH EVERY 6 HOURS AS NEEDED FOR WHEEZE OR SHORTNESS OF BREATH 8.5 each 2   baclofen (LIORESAL) 10 MG tablet TAKE 1 TABLET BY MOUTH DAILY AS NEEDED FOR MUSCLE SPASMS. 30 tablet 0   blood glucose meter kit and supplies KIT Dispense based on patient and insurance preference. Use up to four times daily as directed. (FOR ICD-9 250.00, 250.01). (Patient not taking: Reported on 11/10/2021) 1 each 0   hydrOXYzine (ATARAX) 10 MG tablet TAKE 1 TABLET BY MOUTH DAILY AS  NEEDED FOR ANXIETY. (Patient not taking: Reported on 11/10/2021) 30 tablet 0   ibuprofen (ADVIL) 800 MG tablet TAKE 1 TABLET BY MOUTH EVERY 8 HOURS AS NEEDED FOR PAIN 30 tablet 3   venlafaxine XR (EFFEXOR XR) 37.5 MG 24 hr capsule Take 1 capsule (37.5 mg total) by mouth daily with breakfast. (Patient not taking: Reported on 11/14/2021) 30 capsule 3   Vitamin D, Ergocalciferol, (DRISDOL) 1.25 MG (50000 UNIT) CAPS capsule Take 1 capsule (50,000 Units total) by mouth every 7 (seven) days. (Patient not taking: Reported on 11/14/2021) 12 capsule 0   Current Facility-Administered  Medications  Medication Dose Route Frequency Provider Last Rate Last Admin   0.9 %  sodium chloride infusion  500 mL Intravenous Once Daryel November, MD        Allergies as of 11/14/2021 - Review Complete 11/14/2021  Allergen Reaction Noted   No known allergies  06/04/2019    Family History  Problem Relation Age of Onset   Thyroid disease Mother    Diabetes Father    Hypertension Father    Hyperlipidemia Father    Learning disabilities Father    Heart disease Father 25       MI   Leukemia Father 49   Breast cancer Maternal Aunt    Colon polyps Neg Hx    Colon cancer Neg Hx    Esophageal cancer Neg Hx    Stomach cancer Neg Hx    Rectal cancer Neg Hx     Social History   Socioeconomic History   Marital status: Single    Spouse name: Not on file   Number of children: 3   Years of education: Not on file   Highest education level: Not on file  Occupational History   Not on file  Tobacco Use   Smoking status: Former    Packs/day: 0.00    Years: 0.00    Total pack years: 0.00    Types: Cigarettes    Quit date: 02/29/2020    Years since quitting: 1.7   Smokeless tobacco: Never  Vaping Use   Vaping Use: Every day  Substance and Sexual Activity   Alcohol use: Not Currently    Comment: occasionally   Drug use: Not Currently   Sexual activity: Not Currently    Birth control/protection: None  Other Topics Concern   Not on file  Social History Narrative   Not on file   Social Determinants of Health   Financial Resource Strain: Not on file  Food Insecurity: Not on file  Transportation Needs: Not on file  Physical Activity: Not on file  Stress: Not on file  Social Connections: Not on file  Intimate Partner Violence: Not on file    Review of Systems:  All other review of systems negative except as mentioned in the HPI.  Physical Exam: Vital signs BP 130/75 (BP Location: Right Arm, Patient Position: Sitting, Cuff Size: Normal)   Pulse 89   Temp 98.8 F  (37.1 C) (Temporal)   Ht 5' 1.5" (1.562 m)   Wt 211 lb (95.7 kg)   SpO2 94%   BMI 39.22 kg/m   General:   Alert,  Well-developed, well-nourished, pleasant and cooperative in NAD Airway:  Mallampati 3 Lungs:  Clear throughout to auscultation.   Heart:  Regular rate and rhythm; no murmurs, clicks, rubs,  or gallops. Abdomen:  Soft, nontender and nondistended. Normal bowel sounds.   Neuro/Psych:  Normal mood and affect. A and O x 3  Leslye Puccini E. Candis Schatz, MD [facility]

## 2021-11-14 NOTE — Progress Notes (Signed)
Report to PACU, RN, vss, BBS= Clear.  

## 2021-11-14 NOTE — Progress Notes (Signed)
Vitals-JF  Pt's states no medical or surgical changes since previsit or office visit.  

## 2021-11-14 NOTE — Op Note (Signed)
[place] Patient Name: Robyn Barker Procedure Date: 11/14/2021 9:59 AM MRN: [id number] Endoscopist: Nicki Reaper E. Candis Schatz , MD Age: 46 Referring MD:  Date of Birth: 1975/12/14 Gender: Female Account #: [id number] Procedure:                Colonoscopy Indications:              Screening for colorectal malignant neoplasm, This                            is the patient's first colonoscopy Medicines:                Monitored Anesthesia Care Procedure:                Pre-Anesthesia Assessment:                           - Prior to the procedure, a History and Physical                            was performed, and patient medications and                            allergies were reviewed. The patient's tolerance of                            previous anesthesia was also reviewed. The risks                            and benefits of the procedure and the sedation                            options and risks were discussed with the patient.                            All questions were answered, and informed consent                            was obtained. Prior Anticoagulants: The patient has                            taken no previous anticoagulant or antiplatelet                            agents. ASA Grade Assessment: II - A patient with                            mild systemic disease. After reviewing the risks                            and benefits, the patient was deemed in                            satisfactory condition to undergo the procedure.  After obtaining informed consent, the colonoscope                            was passed under direct vision. Throughout the                            procedure, the patient's blood pressure, pulse, and                            oxygen saturations were monitored continuously. The                            CF [id number] was introduced through the anus                            and advanced to  the the cecum, identified by                            appendiceal orifice and ileocecal valve. The                            colonoscopy was performed without difficulty. The                            patient tolerated the procedure well. The quality                            of the bowel preparation was excellent. The                            ileocecal valve, appendiceal orifice, and rectum                            were photographed. The bowel preparation used was                            GoLYTELY via split dose instruction. Scope In: 10:07:55 AM Scope Out: 10:21:56 AM Scope Withdrawal Time: 0 hours 9 minutes 32 seconds  Total Procedure Duration: 0 hours 14 minutes 1 second  Findings:                 The perianal and digital rectal examinations were                            normal. Pertinent negatives include normal                            sphincter tone and no palpable rectal lesions.                           Multiple small and large-mouthed diverticula were                            found in the sigmoid colon, descending colon and  ascending colon.                           The exam was otherwise normal throughout the                            examined colon.                           The retroflexed view of the distal rectum and anal                            verge was normal and showed no anal or rectal                            abnormalities. Complications:            No immediate complications. Estimated Blood Loss:     Estimated blood loss: none. Impression:               - Diverticulosis in the sigmoid colon, in the                            descending colon and in the ascending colon.                           - The distal rectum and anal verge are normal on                            retroflexion view.                           - No specimens collected. Recommendation:           - Patient has a contact number available for                             emergencies. The signs and symptoms of potential                            delayed complications were discussed with the                            patient. Return to normal activities tomorrow.                            Written discharge instructions were provided to the                            patient.                           - Resume previous diet.                           - Continue present medications.                           -  Repeat colonoscopy in 10 years for screening                            purposes. Alejandro Gamel E. Candis Schatz, MD 11/14/2021 10:27:34 AM This report has been signed electronically.

## 2021-11-15 ENCOUNTER — Telehealth: Payer: Self-pay | Admitting: Gastroenterology

## 2021-11-15 ENCOUNTER — Telehealth: Payer: Self-pay

## 2021-11-15 NOTE — Telephone Encounter (Signed)
Inbound call from patient stating that she needs a work note fax to her job. Patient stated that she forgot to ask for one yesterday after her procedure. Please advise.  Fax:  [phone]

## 2021-11-15 NOTE — Telephone Encounter (Signed)
PT needed a work note for her Colonoscopy procedure day. Sent to her via mychart and faxed to [phone].

## 2021-11-15 NOTE — Telephone Encounter (Signed)
  Follow up Call-     11/14/2021    9:23 AM 11/14/2021    9:12 AM  Call back number  Post procedure Call Back phone  # [phone]   Permission to leave phone message  Yes     Patient questions:  Do you have a fever, pain , or abdominal swelling? No. Pain Score  0 *  Have you tolerated food without any problems? Yes.    Have you been able to return to your normal activities? Yes.    Do you have any questions about your discharge instructions: Diet   No. Medications  No. Follow up visit  No.  Do you have questions or concerns about your Care? No.  Actions: * If pain score is 4 or above: No action needed, pain <4.

## 2021-11-24 ENCOUNTER — Ambulatory Visit (INDEPENDENT_AMBULATORY_CARE_PROVIDER_SITE_OTHER): Payer: No Typology Code available for payment source | Admitting: Family Medicine

## 2021-11-24 ENCOUNTER — Encounter: Payer: Self-pay | Admitting: Family Medicine

## 2021-11-24 VITALS — BP 124/82 | HR 85 | Temp 98.4°F | Ht 61.5 in | Wt 210.0 lb

## 2021-11-24 DIAGNOSIS — F411 Generalized anxiety disorder: Secondary | ICD-10-CM | POA: Diagnosis not present

## 2021-11-24 DIAGNOSIS — F99 Mental disorder, not otherwise specified: Secondary | ICD-10-CM

## 2021-11-24 DIAGNOSIS — F320 Major depressive disorder, single episode, mild: Secondary | ICD-10-CM

## 2021-11-24 DIAGNOSIS — F5105 Insomnia due to other mental disorder: Secondary | ICD-10-CM | POA: Diagnosis not present

## 2021-11-24 DIAGNOSIS — F410 Panic disorder [episodic paroxysmal anxiety] without agoraphobia: Secondary | ICD-10-CM

## 2021-11-24 NOTE — Patient Instructions (Addendum)
Start Allergra, Xyzal  or Zyrtec at bedtime.  Can try Flonase 2 sprays per nostril. Restart venlafaxine 37.5 mg daily.  Use atarax as needed for panic attack. Call to set up counselor.  Remain out of work until next [place].. plan return  to work 10/17.

## 2021-11-24 NOTE — Progress Notes (Signed)
Patient ID: Robyn Barker, female    DOB: [birth year], 46 y.o.   MRN: [id number]  This visit was conducted in person.  BP 124/82   Pulse 85   Temp 98.4 F (36.9 C) (Temporal)   Ht 5' 1.5" (1.562 m)   Wt 210 lb (95.3 kg)   SpO2 97%   BMI 39.04 kg/m    CC:  Chief Complaint  Patient presents with   Anxiety    Subjective:   HPI: Robyn Barker is a 46 y.o. female presenting on 11/24/2021 for Anxiety   GAD/MDD, chronic insomnia: Today she reports she was doing well overall until yesterday. She was feeling better so she stopped taking the venlafaxine 37.5 mg daily.   There are new issues with management, new people.   She becomes  overwhelmed when increasing hours, management changing schedule willy/nilly.  Yesterday she had  a panic attack ... Chest pain, SOB, palpitations.  Atarax  and deep breathing, leaving work helped calm her down.    She has started having nasal congestion, runny nose.. feels it could be allergies.  Plans to get Claritin.     11/24/2021   10:46 AM 07/15/2021   12:46 PM 06/14/2021   12:23 PM  Depression screen PHQ 2/9  Decreased Interest 0 1 3  Down, Depressed, Hopeless 1 0 3  PHQ - 2 Score _0 Altered sleeping 0 1 3  Tired, decreased energy 0 1 3  Change in appetite 0 0 3  Feeling bad or failure about yourself  0 0 1  Trouble concentrating 0 0 2  Moving slowly or fidgety/restless 0 0 2  Suicidal thoughts 0 0 0  PHQ-9 Score _1 Difficult doing work/chores   Very difficult      11/24/2021   10:47 AM 07/15/2021   12:46 PM 06/14/2021   12:23 PM 05/10/2021    9:22 AM  GAD 7 : Generalized Anxiety Score  Nervous, Anxious, on Edge _2 Control/stop worrying _3 Worry too much - different things _4 Trouble relaxing _5 Restless _6 Easily annoyed or irritable _7 Afraid - awful might happen 1 0 1 1  Total GAD 7 Score _8 Anxiety Difficulty   Very difficult Somewhat difficult           Relevant past medical, surgical, family and social history reviewed and updated as indicated. Interim medical history since our last visit reviewed. Allergies and medications reviewed and updated. Outpatient Medications Prior to Visit  Medication Sig Dispense Refill   albuterol (VENTOLIN HFA) 108 (90 Base) MCG/ACT inhaler INHALE 2 PUFFS BY MOUTH EVERY 6 HOURS AS NEEDED FOR WHEEZE OR SHORTNESS OF BREATH 8.5 each 2   baclofen (LIORESAL) 10 MG tablet TAKE 1 TABLET BY MOUTH DAILY AS NEEDED FOR MUSCLE SPASMS. 30 tablet 0   hydrOXYzine (ATARAX) 10 MG tablet TAKE 1 TABLET BY MOUTH DAILY AS NEEDED FOR ANXIETY. 30 tablet 0   ibuprofen (ADVIL) 800 MG tablet TAKE 1 TABLET BY MOUTH EVERY 8 HOURS AS NEEDED FOR PAIN 30 tablet 3   Vitamin D, Ergocalciferol, (DRISDOL) 1.25 MG (50000 UNIT) CAPS capsule Take 1 capsule (50,000 Units total) by mouth every 7 (seven) days. 12 capsule 0   blood glucose meter kit and supplies KIT Dispense based on patient and insurance preference. Use up to  four times daily as directed. (FOR ICD-9 250.00, 250.01). (Patient not taking: Reported on 11/10/2021) 1 each 0   venlafaxine XR (EFFEXOR XR) 37.5 MG 24 hr capsule Take 1 capsule (37.5 mg total) by mouth daily with breakfast. (Patient not taking: Reported on 11/14/2021) 30 capsule 3   No facility-administered medications prior to visit.     Per HPI unless specifically indicated in ROS section below Review of Systems Objective:  BP 124/82   Pulse 85   Temp 98.4 F (36.9 C) (Temporal)   Ht 5' 1.5" (1.562 m)   Wt 210 lb (95.3 kg)   SpO2 97%   BMI 39.04 kg/m   Wt Readings from Last 3 Encounters:  11/24/21 210 lb (95.3 kg)  11/14/21 211 lb (95.7 kg)  11/10/21 211 lb (95.7 kg)      Physical Exam Constitutional:      General: She is not in acute distress.    Appearance: Normal appearance. She is well-developed. She is not ill-appearing or toxic-appearing.  HENT:     Head: Normocephalic.     Right Ear: Hearing, tympanic  membrane, ear canal and external ear normal. Tympanic membrane is not erythematous, retracted or bulging.     Left Ear: Hearing, tympanic membrane, ear canal and external ear normal. Tympanic membrane is not erythematous, retracted or bulging.     Nose: No mucosal edema or rhinorrhea.     Right Sinus: No maxillary sinus tenderness or frontal sinus tenderness.     Left Sinus: No maxillary sinus tenderness or frontal sinus tenderness.     Mouth/Throat:     Pharynx: Uvula midline.  Eyes:     General: Lids are normal. Lids are everted, no foreign bodies appreciated.     Conjunctiva/sclera: Conjunctivae normal.     Pupils: Pupils are equal, round, and reactive to light.  Neck:     Thyroid: No thyroid mass or thyromegaly.     Vascular: No carotid bruit.     Trachea: Trachea normal.  Cardiovascular:     Rate and Rhythm: Normal rate and regular rhythm.     Pulses: Normal pulses.     Heart sounds: Normal heart sounds, S1 normal and S2 normal. No murmur heard.    No friction rub. No gallop.  Pulmonary:     Effort: Pulmonary effort is normal. No tachypnea or respiratory distress.     Breath sounds: Normal breath sounds. No decreased breath sounds, wheezing, rhonchi or rales.  Abdominal:     General: Bowel sounds are normal.     Palpations: Abdomen is soft.     Tenderness: There is no abdominal tenderness.  Musculoskeletal:     Cervical back: Normal range of motion and neck supple.  Skin:    General: Skin is warm and dry.     Findings: No rash.  Neurological:     Mental Status: She is alert.  Psychiatric:        Attention and Perception: Attention normal.        Mood and Affect: Mood is anxious. Mood is not depressed. Affect is labile.        Speech: Speech normal.        Behavior: Behavior normal. Behavior is cooperative.        Thought Content: Thought content normal.        Cognition and Memory: Cognition normal.        Judgment: Judgment normal.       Results for orders placed or  performed in visit  on 08/16/21  VITAMIN D 25 Hydroxy (Vit-D Deficiency, Fractures)  Result Value Ref Range   VITD 13.45 (L) 30.00 - 100.00 ng/mL  Microalbumin / creatinine urine ratio  Result Value Ref Range   Microalb, Ur 1.1 0.0 - 1.9 mg/dL   Creatinine,U 169.5 mg/dL   Microalb Creat Ratio 0.7 0.0 - 30.0 mg/g  Comprehensive metabolic panel  Result Value Ref Range   Sodium 138 135 - 145 mEq/L   Potassium 4.1 3.5 - 5.1 mEq/L   Chloride 104 96 - 112 mEq/L   CO2 25 19 - 32 mEq/L   Glucose, Bld 120 (H) 70 - 99 mg/dL   BUN 10 6 - 23 mg/dL   Creatinine, Ser 0.83 0.40 - 1.20 mg/dL   Total Bilirubin 0.3 0.2 - 1.2 mg/dL   Alkaline Phosphatase 38 (L) 39 - 117 U/L   AST 12 0 - 37 U/L   ALT 12 0 - 35 U/L   Total Protein 6.4 6.0 - 8.3 g/dL   Albumin 3.9 3.5 - 5.2 g/dL   GFR 84.97 >60.00 mL/min   Calcium 9.3 8.4 - 10.5 mg/dL  Lipid panel  Result Value Ref Range   Cholesterol 169 0 - 200 mg/dL   Triglycerides 201.0 (H) 0.0 - 149.0 mg/dL   HDL 38.70 (L) >39.00 mg/dL   VLDL 40.2 (H) 0.0 - 40.0 mg/dL   Total CHOL/HDL Ratio 4    NonHDL 130.02   Hemoglobin A1c  Result Value Ref Range   Hgb A1c MFr Bld 7.3 (H) 4.6 - 6.5 %  LDL cholesterol, direct  Result Value Ref Range   Direct LDL 100.0 mg/dL     COVID 19 screen:  No recent travel or known exposure to COVID19 The patient denies respiratory symptoms of COVID 19 at this time. The importance of social distancing was discussed today.   Assessment and Plan Problem List Items Addressed This Visit     GAD (generalized anxiety disorder)   Relevant Orders   Ambulatory referral to Psychology   Insomnia due to other mental disorder   Relevant Orders   Ambulatory referral to Psychology   MDD (major depressive disorder), single episode, mild ([place]) - Primary   Relevant Orders   Ambulatory referral to Psychology   Panic attacks   Relevant Orders   Ambulatory referral to Psychology   Situational panic attacks and anxiety worsened by  changes at work.  I encouraged her to restart venlafaxine 37.5 mg p.o. daily.  She will Continue to use Atarax as needed for panic attack as well as behavioral techniques such as deep breathing and relaxation.  I have placed a referral to psychology for her to move forward with counseling given situational nature of anxiety and panic attacks. She will remain out of work until follow-up at the next appointment with plan to return to work on December 06, 2021. We discussed going forward she should probably have FMLA papers completed for possible intermittent leave as needed for panic attacks.  Eliezer Lofts, MD

## 2021-12-02 ENCOUNTER — Ambulatory Visit: Payer: No Typology Code available for payment source | Admitting: Family Medicine

## 2021-12-02 ENCOUNTER — Ambulatory Visit (INDEPENDENT_AMBULATORY_CARE_PROVIDER_SITE_OTHER): Payer: No Typology Code available for payment source | Admitting: Family Medicine

## 2021-12-02 VITALS — BP 128/78 | HR 96 | Temp 97.3°F | Ht 61.5 in | Wt 212.4 lb

## 2021-12-02 DIAGNOSIS — F411 Generalized anxiety disorder: Secondary | ICD-10-CM | POA: Diagnosis not present

## 2021-12-02 DIAGNOSIS — J4541 Moderate persistent asthma with (acute) exacerbation: Secondary | ICD-10-CM

## 2021-12-02 MED ORDER — IPRATROPIUM BROMIDE 0.02 % IN SOLN
0.5000 mg | Freq: Once | RESPIRATORY_TRACT | Status: AC
  Administered 2021-12-02: 0.5 mg via RESPIRATORY_TRACT

## 2021-12-02 MED ORDER — METHYLPREDNISOLONE ACETATE 40 MG/ML IJ SUSP
40.0000 mg | Freq: Once | INTRAMUSCULAR | Status: AC
  Administered 2021-12-02: 40 mg via INTRAMUSCULAR

## 2021-12-02 MED ORDER — ALBUTEROL SULFATE (2.5 MG/3ML) 0.083% IN NEBU
5.0000 mg | INHALATION_SOLUTION | Freq: Once | RESPIRATORY_TRACT | Status: AC
  Administered 2021-12-02: 5 mg via RESPIRATORY_TRACT

## 2021-12-02 NOTE — Progress Notes (Signed)
Patient ID: Robyn Barker, female    DOB: 09/17/1975, 46 y.o.   MRN: [id number]  This visit was conducted in person.  BP 128/78   Pulse 96   Temp (!) 97.3 F (36.3 C) (Temporal)   Ht 5' 1.5" (1.562 m)   Wt 212 lb 6 oz (96.3 kg)   LMP 11/23/2021 (Approximate)   SpO2 97%   BMI 39.48 kg/m    CC:  Chief Complaint  Patient presents with   Follow-up    Mood   Shortness of Breath    Saw UC this morning and had a breathing treatment.     Subjective:   HPI: Robyn Barker is a 46 y.o. female presenting on 12/02/2021 for Follow-up (Mood) and Shortness of Breath (Saw UC this morning and had a breathing treatment. )  At last OV 10/5.Marland Kitchen see for worsening anxiety and panic attacks.  Restarted venlafaxine 37.5 mg daily,  Using atarax as needed... Referred to counselor.  FMLA with plan to return to work 10/17.   Worsened  of runny nose, cough, SOB,  no fever. Worsened in last 3 days. Hx of mild intermittent asthma.  Using albuterol  prn. Seen at urgent Care this AM for wheeze and SOB... Dx with asthma exacerbation bronchitits.. treated with albuterol neb and started on azithromycin and prednisone.    Started on claritin 11/24/2021.   No clear SE to venlafaxine.   Neg COVID test.   No further panic attacks.   She feels ready to return to work on 10/17 for mood but breathing is poor.   Relevant past medical, surgical, family and social history reviewed and updated as indicated. Interim medical history since our last visit reviewed. Allergies and medications reviewed and updated. Outpatient Medications Prior to Visit  Medication Sig Dispense Refill   albuterol (PROVENTIL) (2.5 MG/3ML) 0.083% nebulizer solution Inhale into the lungs.     albuterol (VENTOLIN HFA) 108 (90 Base) MCG/ACT inhaler INHALE 2 PUFFS BY MOUTH EVERY 6 HOURS AS NEEDED FOR WHEEZE OR SHORTNESS OF BREATH 8.5 each 2   azithromycin (ZITHROMAX) 250 MG tablet Take 250 mg by mouth daily. Until 12/04/21      baclofen (LIORESAL) 10 MG tablet TAKE 1 TABLET BY MOUTH DAILY AS NEEDED FOR MUSCLE SPASMS. 30 tablet 0   hydrOXYzine (ATARAX) 10 MG tablet TAKE 1 TABLET BY MOUTH DAILY AS NEEDED FOR ANXIETY. 30 tablet 0   ibuprofen (ADVIL) 800 MG tablet TAKE 1 TABLET BY MOUTH EVERY 8 HOURS AS NEEDED FOR PAIN 30 tablet 3   ipratropium-albuterol (DUONEB) 0.5-2.5 (3) MG/3ML SOLN Inhale into the lungs.     predniSONE (DELTASONE) 20 MG tablet Take 60 mg by mouth daily. For 5 days, starting 12/02/21     venlafaxine XR (EFFEXOR XR) 37.5 MG 24 hr capsule Take 1 capsule (37.5 mg total) by mouth daily with breakfast. 30 capsule 3   Vitamin D, Ergocalciferol, (DRISDOL) 1.25 MG (50000 UNIT) CAPS capsule Take 1 capsule (50,000 Units total) by mouth every 7 (seven) days. 12 capsule 0   No facility-administered medications prior to visit.     Per HPI unless specifically indicated in ROS section below Review of Systems  Constitutional:  Negative for fatigue and fever.  HENT:  Positive for congestion.   Eyes:  Negative for pain.  Respiratory:  Positive for cough and shortness of breath.   Cardiovascular:  Negative for chest pain, palpitations and leg swelling.  Gastrointestinal:  Negative for abdominal pain.  Genitourinary:  Negative  for dysuria and vaginal bleeding.  Musculoskeletal:  Negative for back pain.  Neurological:  Negative for syncope, light-headedness and headaches.  Psychiatric/Behavioral:  Negative for dysphoric mood.    Objective:  BP 128/78   Pulse 96   Temp (!) 97.3 F (36.3 C) (Temporal)   Ht 5' 1.5" (1.562 m)   Wt 212 lb 6 oz (96.3 kg)   LMP 11/23/2021 (Approximate)   SpO2 97%   BMI 39.48 kg/m   Wt Readings from Last 3 Encounters:  12/02/21 212 lb 6 oz (96.3 kg)  11/24/21 210 lb (95.3 kg)  11/14/21 211 lb (95.7 kg)      Physical Exam Constitutional:      General: She is not in acute distress.    Appearance: Normal appearance. She is well-developed. She is not ill-appearing or  toxic-appearing.  HENT:     Head: Normocephalic.     Right Ear: Hearing, tympanic membrane, ear canal and external ear normal. Tympanic membrane is not erythematous, retracted or bulging.     Left Ear: Hearing, tympanic membrane, ear canal and external ear normal. Tympanic membrane is not erythematous, retracted or bulging.     Nose: No mucosal edema or rhinorrhea.     Right Sinus: No maxillary sinus tenderness or frontal sinus tenderness.     Left Sinus: No maxillary sinus tenderness or frontal sinus tenderness.     Mouth/Throat:     Pharynx: Uvula midline.  Eyes:     General: Lids are normal. Lids are everted, no foreign bodies appreciated.     Conjunctiva/sclera: Conjunctivae normal.     Pupils: Pupils are equal, round, and reactive to light.  Neck:     Thyroid: No thyroid mass or thyromegaly.     Vascular: No carotid bruit.     Trachea: Trachea normal.  Cardiovascular:     Rate and Rhythm: Normal rate and regular rhythm.     Pulses: Normal pulses.     Heart sounds: Normal heart sounds, S1 normal and S2 normal. No murmur heard.    No friction rub. No gallop.  Pulmonary:     Effort: Pulmonary effort is normal. Tachypnea present. No respiratory distress.     Breath sounds: Decreased air movement present. Examination of the right-upper field reveals wheezing. Examination of the left-upper field reveals wheezing. Examination of the right-middle field reveals wheezing. Examination of the left-middle field reveals wheezing. Examination of the right-lower field reveals wheezing. Examination of the left-lower field reveals wheezing. Wheezing present. No decreased breath sounds, rhonchi or rales.  Abdominal:     General: Bowel sounds are normal.     Palpations: Abdomen is soft.     Tenderness: There is no abdominal tenderness.  Musculoskeletal:     Cervical back: Normal range of motion and neck supple.  Skin:    General: Skin is warm and dry.     Findings: No rash.  Neurological:      Mental Status: She is alert.  Psychiatric:        Mood and Affect: Mood is depressed. Mood is not anxious.        Speech: Speech normal.        Behavior: Behavior normal. Behavior is cooperative.        Thought Content: Thought content normal.        Judgment: Judgment normal.       Results for orders placed or performed in visit on 08/16/21  VITAMIN D 25 Hydroxy (Vit-D Deficiency, Fractures)  Result Value Ref Range  VITD 13.45 (L) 30.00 - 100.00 ng/mL  Microalbumin / creatinine urine ratio  Result Value Ref Range   Microalb, Ur 1.1 0.0 - 1.9 mg/dL   Creatinine,U 557.3 mg/dL   Microalb Creat Ratio 0.7 0.0 - 30.0 mg/g  Comprehensive metabolic panel  Result Value Ref Range   Sodium 138 135 - 145 mEq/L   Potassium 4.1 3.5 - 5.1 mEq/L   Chloride 104 96 - 112 mEq/L   CO2 25 19 - 32 mEq/L   Glucose, Bld 120 (H) 70 - 99 mg/dL   BUN 10 6 - 23 mg/dL   Creatinine, Ser 2.20 0.40 - 1.20 mg/dL   Total Bilirubin 0.3 0.2 - 1.2 mg/dL   Alkaline Phosphatase 38 (L) 39 - 117 U/L   AST 12 0 - 37 U/L   ALT 12 0 - 35 U/L   Total Protein 6.4 6.0 - 8.3 g/dL   Albumin 3.9 3.5 - 5.2 g/dL   GFR 25.42 >70.62 mL/min   Calcium 9.3 8.4 - 10.5 mg/dL  Lipid panel  Result Value Ref Range   Cholesterol 169 0 - 200 mg/dL   Triglycerides 376.2 (H) 0.0 - 149.0 mg/dL   HDL 83.15 (L) >17.61 mg/dL   VLDL 60.7 (H) 0.0 - 37.1 mg/dL   Total CHOL/HDL Ratio 4    NonHDL 130.02   Hemoglobin A1c  Result Value Ref Range   Hgb A1c MFr Bld 7.3 (H) 4.6 - 6.5 %  LDL cholesterol, direct  Result Value Ref Range   Direct LDL 100.0 mg/dL     COVID 19 screen:  No recent travel or known exposure to COVID19 The patient denies respiratory symptoms of COVID 19 at this time. The importance of social distancing was discussed today.   Assessment and Plan    Problem List Items Addressed This Visit     GAD (generalized anxiety disorder)    Chronic, stable  Restarted venlafaxine 37.5 mg daily,  Using atarax as  needed... Referred to counselor.  FMLA with plan to return to work 10/17      Moderate persistent asthma with exacerbation - Primary    Acute, minimal improvement with initiation of prednisone and azithromycin.  Given Depo-Medrol injection at office visit today as well as DuoNeb nebulizer treatment.  Following nebulizer treatment she was able to breathe more easily with less wheezing. She will continue albuterol as needed at home.  ER and return precautions provided.      Relevant Medications   albuterol (PROVENTIL) (2.5 MG/3ML) 0.083% nebulizer solution   Other Relevant Orders   For home use only DME Nebulizer machine   Meds ordered this encounter  Medications   methylPREDNISolone acetate (DEPO-MEDROL) injection 40 mg   ipratropium (ATROVENT) nebulizer solution 0.5 mg   albuterol (PROVENTIL) (2.5 MG/3ML) 0.083% nebulizer solution 5 mg     Kerby Nora, MD

## 2021-12-05 ENCOUNTER — Telehealth: Payer: Self-pay | Admitting: Family Medicine

## 2021-12-05 DIAGNOSIS — Z0279 Encounter for issue of other medical certificate: Secondary | ICD-10-CM

## 2021-12-05 NOTE — Telephone Encounter (Signed)
Patient dropped of FMLA and short term disability forms.  These have been placed in your inbox for completion and signing.  Patient has multiple parts of the forms that need to be filled out and signed.  Patient would like a phone call when the forms are complete.  FMLA form must be turned in to employer by patient.  Short term disability form can be faxed, then copy given to patient once everything has been signed by patient.

## 2021-12-05 NOTE — Telephone Encounter (Signed)
Patient walked in and dropped off FMLA and STD paperwork. Placed them in your box. She would like a call when they are done. Thank  you!

## 2021-12-06 ENCOUNTER — Ambulatory Visit (INDEPENDENT_AMBULATORY_CARE_PROVIDER_SITE_OTHER): Payer: No Typology Code available for payment source | Admitting: Family Medicine

## 2021-12-06 ENCOUNTER — Encounter: Payer: Self-pay | Admitting: Family Medicine

## 2021-12-06 DIAGNOSIS — S22019D Unspecified fracture of first thoracic vertebra, subsequent encounter for fracture with routine healing: Secondary | ICD-10-CM

## 2021-12-06 DIAGNOSIS — S32049D Unspecified fracture of fourth lumbar vertebra, subsequent encounter for fracture with routine healing: Secondary | ICD-10-CM

## 2021-12-06 DIAGNOSIS — R350 Frequency of micturition: Secondary | ICD-10-CM | POA: Insufficient documentation

## 2021-12-06 DIAGNOSIS — F411 Generalized anxiety disorder: Secondary | ICD-10-CM

## 2021-12-06 DIAGNOSIS — S060X1A Concussion with loss of consciousness of 30 minutes or less, initial encounter: Secondary | ICD-10-CM | POA: Insufficient documentation

## 2021-12-06 DIAGNOSIS — S060X1D Concussion with loss of consciousness of 30 minutes or less, subsequent encounter: Secondary | ICD-10-CM

## 2021-12-06 LAB — POC URINALSYSI DIPSTICK (AUTOMATED)
Bilirubin, UA: NEGATIVE
Glucose, UA: NEGATIVE
Ketones, UA: NEGATIVE
Leukocytes, UA: NEGATIVE
Nitrite, UA: NEGATIVE
Protein, UA: NEGATIVE
Spec Grav, UA: 1.025 (ref 1.010–1.025)
Urobilinogen, UA: 0.2 E.U./dL
pH, UA: 5.5 (ref 5.0–8.0)

## 2021-12-06 MED ORDER — CLONAZEPAM 0.5 MG PO TABS: 0.5000 mg | ORAL_TABLET | Freq: Two times a day (BID) | ORAL | 0 refills | Status: DC | PRN

## 2021-12-06 NOTE — Assessment & Plan Note (Signed)
Acute, she has had some mild urge incontinence in the past and this has significantly worsened possibly secondary to medication she is received versus stress.  This is not clearly associated with total loss of control of urine which would be concerning in setting of vertebral fracture.  I did encourage her to tell the neurosurgeon about her symptoms.  Will check urinalysis today to rule out infection.
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
## 2021-12-06 NOTE — Assessment & Plan Note (Signed)
Acute No red flags except for unusual increase in urinary frequency and urge.  She will call to set up appointment with neurosurgery again today as she did not hear back with from them yet for an appointment when she called yesterday.  She will let me know if she is having difficulty making this appointment and I will assist her with the referral.  I reviewed ER precautions including urinary incontinence, perineal numbness, fever and lower extremity weakness.  She voiced understanding.

## 2021-12-06 NOTE — Patient Instructions (Addendum)
Use clonazepam at night to help with muscle spasm and anxiety.  Use tylenol for pain. Use heat and gentle stretching  for back pain.  Call  Neurosurgery where you have been referred for ASAP appointment.  Call me if you have trouble making this appointment.  Total brain rest for likely concussion Go to the emergency room if you have numbness in your groin, lower extremity weakness or complete loss of urine control.

## 2021-12-06 NOTE — Assessment & Plan Note (Signed)
Mild headache likely due to concussion.  Encouraged total brain rest.  Advance as tolerated.  She can also use Tylenol as needed for headache pain from hematoma.

## 2021-12-06 NOTE — Addendum Note (Signed)
Addended by: Carter Kitten on: 12/06/2021 09:52 AM   Modules accepted: Orders
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
## 2021-12-07 NOTE — Telephone Encounter (Signed)
I spoke to Robyn Barker over the phone to notify her that she has quite a few pages of information to fill out for both of her FMLA forms.  She will either come by this afternoon or tomorrow morning to fill them out.  The forms are in my desk in the yellow (need to sign) folder.

## 2021-12-12 NOTE — Telephone Encounter (Signed)
Patient came by office filled out forms. Patient given copy.  No further action needed at this time.

## 2022-01-07 DIAGNOSIS — J4541 Moderate persistent asthma with (acute) exacerbation: Secondary | ICD-10-CM | POA: Insufficient documentation

## 2022-01-07 DIAGNOSIS — J4540 Moderate persistent asthma, uncomplicated: Secondary | ICD-10-CM | POA: Insufficient documentation

## 2022-01-07 NOTE — Assessment & Plan Note (Signed)
Chronic, stable  Restarted venlafaxine 37.5 mg daily,  Using atarax as needed... Referred to counselor.  FMLA with plan to return to work 10/17

## 2022-01-07 NOTE — Assessment & Plan Note (Signed)
Acute, minimal improvement with initiation of prednisone and azithromycin.  Given Depo-Medrol injection at office visit today as well as DuoNeb nebulizer treatment.  Following nebulizer treatment she was able to breathe more easily with less wheezing. She will continue albuterol as needed at home.  ER and return precautions provided.

## 2022-01-17 ENCOUNTER — Telehealth: Payer: Self-pay | Admitting: Family Medicine

## 2022-01-17 ENCOUNTER — Other Ambulatory Visit: Payer: Self-pay | Admitting: Family Medicine

## 2022-01-17 DIAGNOSIS — Z1231 Encounter for screening mammogram for malignant neoplasm of breast: Secondary | ICD-10-CM

## 2022-01-17 NOTE — Telephone Encounter (Signed)
Noted  
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
## 2022-02-23 ENCOUNTER — Telehealth: Payer: Self-pay | Admitting: Family Medicine

## 2022-02-23 NOTE — Telephone Encounter (Signed)
Patient came in and dropped off disability paperwork for PCP to fill out and has been placed in PCP folder. Call back number [phone].

## 2022-02-24 NOTE — Telephone Encounter (Signed)
Paperwork placed in provider box °

## 2022-03-01 NOTE — Telephone Encounter (Signed)
Pt called to ask for status of disability ppw? Pt stated its a fax # on ppw where it can be faxed to once completed by [facility]. Call back # [phone]
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
## 2022-03-01 NOTE — Telephone Encounter (Signed)
Left message for Robyn Barker that paperwork is still in process.  Will likely be ready by the end of the week.

## 2022-03-01 NOTE — Telephone Encounter (Signed)
This paperwork is still in process.  Will likely be ready by end of week. I put it in my outbox to send to Anda Kraft to begin completing, I will likely finish it Thursday or Friday.

## 2022-03-02 DIAGNOSIS — Z0279 Encounter for issue of other medical certificate: Secondary | ICD-10-CM

## 2022-03-02 NOTE — Telephone Encounter (Signed)
Completed to the best of my ability and in my outbox!

## 2022-03-03 NOTE — Telephone Encounter (Signed)
Completed form has been faxed to fax# [phone].  Received fax confirmation.  Copies made for billing, scanning, patient, and myself.  Sent msg via mychart to see how patient would like to receive her copy.

## 2022-03-13 ENCOUNTER — Ambulatory Visit
Admission: RE | Admit: 2022-03-13 | Discharge: 2022-03-13 | Disposition: A | Discharge status: Home / Self Care | Payer: No Typology Code available for payment source | Source: Ambulatory Visit | Attending: Family Medicine | Admitting: Family Medicine

## 2022-03-13 DIAGNOSIS — Z1231 Encounter for screening mammogram for malignant neoplasm of breast: Secondary | ICD-10-CM | POA: Insufficient documentation

## 2022-03-16 ENCOUNTER — Ambulatory Visit (INDEPENDENT_AMBULATORY_CARE_PROVIDER_SITE_OTHER): Payer: No Typology Code available for payment source | Admitting: Family Medicine

## 2022-03-16 ENCOUNTER — Encounter: Payer: Self-pay | Admitting: Family Medicine

## 2022-03-16 DIAGNOSIS — S32049D Unspecified fracture of fourth lumbar vertebra, subsequent encounter for fracture with routine healing: Secondary | ICD-10-CM

## 2022-03-16 DIAGNOSIS — S22019D Unspecified fracture of first thoracic vertebra, subsequent encounter for fracture with routine healing: Secondary | ICD-10-CM

## 2022-03-16 DIAGNOSIS — F411 Generalized anxiety disorder: Secondary | ICD-10-CM

## 2022-03-16 DIAGNOSIS — S060X1D Concussion with loss of consciousness of 30 minutes or less, subsequent encounter: Secondary | ICD-10-CM
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
## 2022-03-16 NOTE — Patient Instructions (Signed)
Cleared to return back to work on 03/31/22.

## 2022-03-23 ENCOUNTER — Other Ambulatory Visit (HOSPITAL_COMMUNITY)
Admission: RE | Admit: 2022-03-23 | Discharge: 2022-03-23 | Disposition: A | Discharge status: Home / Self Care | Payer: PRIVATE HEALTH INSURANCE | Source: Ambulatory Visit | Attending: Obstetrics and Gynecology | Admitting: Obstetrics and Gynecology

## 2022-03-23 ENCOUNTER — Ambulatory Visit (INDEPENDENT_AMBULATORY_CARE_PROVIDER_SITE_OTHER): Payer: No Typology Code available for payment source | Admitting: Obstetrics and Gynecology

## 2022-03-23 VITALS — BP 125/86 | HR 101 | Ht 61.5 in | Wt 215.6 lb

## 2022-03-23 DIAGNOSIS — Z124 Encounter for screening for malignant neoplasm of cervix: Secondary | ICD-10-CM

## 2022-03-23 DIAGNOSIS — Z01419 Encounter for gynecological examination (general) (routine) without abnormal findings: Secondary | ICD-10-CM | POA: Diagnosis present

## 2022-03-23 NOTE — Progress Notes (Signed)
Obstetrics and Gynecology Annual Patient Evaluation  Appointment Date: 03/23/2022  OBGYN Clinic: [facility]  Primary Care Provider: Eliezer Lofts E  Chief Complaint:  Chief Complaint  Patient presents with   Annual Exam    History of Present Illness: Robyn Barker is a 47 y.o. African-American [phone] (Patient's last menstrual period was 03/22/2022.), seen for the above chief complaint.   Vaginal odor and she wonders if she has BV. No menopausal s/s.    Review of Systems: Pertinent items noted in HPI and remainder of comprehensive ROS otherwise negative.    Patient Active Problem List   Diagnosis Date Noted   Moderate persistent asthma with exacerbation 01/07/2022   Urinary frequency 12/06/2021   Closed fracture of fourth lumbar vertebra with routine healing 12/06/2021   Closed fracture of first thoracic vertebra with routine healing 12/06/2021   Concussion wth loss of consciousness of 30 minutes or less 12/06/2021   Impacted cerumen of right ear 08/16/2021   Panic attacks 06/15/2021   Insomnia due to other mental disorder 06/15/2021   MDD (major depressive disorder), single episode, mild ([place]) 05/10/2021   GAD (generalized anxiety disorder) 05/10/2021   Atopic dermatitis 09/10/2020   Palpitations 06/11/2020   Hyperlipidemia associated with type 2 diabetes mellitus ([place]) 06/11/2020   Controlled type 2 diabetes mellitus without complication, without long-term current use of insulin ([place]) 03/11/2020   Mild intermittent asthma 03/11/2020   Class 2 drug-induced obesity with serious comorbidity and body mass index (BMI) of 39.0 to 39.9 in adult 06/13/2019   Vitamin D deficiency 06/13/2019   Quit smoking 06/13/2019    Past Medical History:  Past Medical History:  Diagnosis Date   Anxiety    on meds   Asthma    inhaler PRN   Depression    on meds   Ectopic pregnancy 05/20/2016   Genital herpes    High cholesterol    diet controlled-  no meds   Seasonal allergies     Past Surgical History:  Past Surgical History:  Procedure Laterality Date   DIAGNOSTIC LAPAROSCOPY WITH REMOVAL OF ECTOPIC PREGNANCY N/A 05/15/2015   Procedure: DIAGNOSTIC LAPAROSCOPY WITH REMOVAL OF ECTOPIC PREGNANCY;  Surgeon: Donnamae Jude, MD;  Location: [facility];  Service: Gynecology;  Laterality: N/A;   DIAGNOSTIC LAPAROSCOPY WITH REMOVAL OF ECTOPIC PREGNANCY Bilateral 05/20/2016   Procedure: DIAGNOSTIC LAPAROSCOPY;  Surgeon: Osborne Oman, MD;  Location: [facility];  Service: Gynecology;  Laterality: Bilateral;   OVARIAN CYST REMOVAL  2003   SALPINGECTOMY Right 05/15/2015   UNILATERAL SALPINGECTOMY Left 05/20/2016   Procedure: UNILATERAL SALPINGECTOMY;  Surgeon: Osborne Oman, MD;  Location: [facility];  Service: Gynecology;  Laterality: Left;   WISDOM TOOTH EXTRACTION      Past Obstetrical History:  OB History  Gravida Para Term Preterm AB Living  5 3 3   1 3   SAB IAB Ectopic Multiple Live Births      1        # Outcome Date GA Lbr Len/2nd Weight Sex Delivery Anes PTL Lv  5 Gravida           4 Ectopic 2017          3 Term      Vag-Spont     2 Term      Vag-Spont     1 Term      Vag-Spont       Past Gynecological History: As per HPI. Periods: qmonth, regular, <1wk, no particularly  heavy or painful History of Pap Smear(s): Yes.   Last pap 2021, which was negative She is currently using  bilateral salpingectomy  for contraception.   Social History:  Social History   Socioeconomic History   Marital status: Single    Spouse name: Not on file   Number of children: 3   Years of education: Not on file   Highest education level: Not on file  Occupational History   Not on file  Tobacco Use   Smoking status: Former    Packs/day: 0.00    Years: 0.00    Total pack years: 0.00    Types: Cigarettes    Quit date: 02/29/2020    Years since quitting: 2.0   Smokeless tobacco: Never  Vaping Use   Vaping Use: Every day  Substance and Sexual  Activity   Alcohol use: Not Currently    Comment: occasionally   Drug use: Not Currently   Sexual activity: Not Currently    Birth control/protection: None  Other Topics Concern   Not on file  Social History Narrative   Not on file   Social Determinants of Health   Financial Resource Strain: Not on file  Food Insecurity: Not on file  Transportation Needs: Not on file  Physical Activity: Not on file  Stress: Not on file  Social Connections: Not on file  Intimate Partner Violence: Not on file    Family History:  Family History  Problem Relation Age of Onset   Thyroid disease Mother    Diabetes Father    Hypertension Father    Hyperlipidemia Father    Learning disabilities Father    Heart disease Father 74       MI   Leukemia Father 7   Breast cancer Maternal Aunt    Colon polyps Neg Hx    Colon cancer Neg Hx    Esophageal cancer Neg Hx    Stomach cancer Neg Hx    Rectal cancer Neg Hx     Health Maintenance:  Mammogram(s): Yes.   Date: January 2024  Medications [name] had no medications administered during this visit. Current Outpatient Medications  Medication Sig Dispense Refill   albuterol (PROVENTIL) (2.5 MG/3ML) 0.083% nebulizer solution Inhale into the lungs.     albuterol (VENTOLIN HFA) 108 (90 Base) MCG/ACT inhaler INHALE 2 PUFFS BY MOUTH EVERY 6 HOURS AS NEEDED FOR WHEEZE OR SHORTNESS OF BREATH 8.5 each 2   azithromycin (ZITHROMAX) 250 MG tablet Take 250 mg by mouth daily. Until 12/04/21     clonazePAM (KLONOPIN) 0.5 MG tablet Take 1 tablet (0.5 mg total) by mouth 2 (two) times daily as needed for anxiety. 15 tablet 0   hydrOXYzine (ATARAX) 10 MG tablet TAKE 1 TABLET BY MOUTH DAILY AS NEEDED FOR ANXIETY. 30 tablet 0   ibuprofen (ADVIL) 800 MG tablet TAKE 1 TABLET BY MOUTH EVERY 8 HOURS AS NEEDED FOR PAIN 30 tablet 3   venlafaxine XR (EFFEXOR XR) 37.5 MG 24 hr capsule Take 1 capsule (37.5 mg total) by mouth daily with breakfast. 30 capsule 3    Vitamin D, Ergocalciferol, (DRISDOL) 1.25 MG (50000 UNIT) CAPS capsule Take 1 capsule (50,000 Units total) by mouth every 7 (seven) days. 12 capsule 0   ipratropium-albuterol (DUONEB) 0.5-2.5 (3) MG/3ML SOLN Inhale into the lungs.     No current facility-administered medications for this visit.    Allergies No known allergies   Physical Exam:  BP 125/86   Pulse (!) 101  Ht 5' 1.5" (1.562 m)   Wt 215 lb 9.6 oz (97.8 kg)   LMP 03/22/2022   BMI 40.08 kg/m  Body mass index is 40.08 kg/m. General appearance: Well nourished, well developed female in no acute distress.  Neck:  Supple, normal appearance, and no thyromegaly  Cardiovascular: normal s1 and s2.  No murmurs, rubs or gallops. Respiratory:  Clear to auscultation bilateral. Normal respiratory effort Abdomen: positive bowel sounds and no masses, hernias; diffusely non tender to palpation, non distended Neuro/Psych:  Normal mood and affect.  Skin:  Warm and dry.  Lymphatic:  No inguinal lymphadenopathy.   Pelvic exam: is limited by body habitus EGBUS: within normal limits Vagina: within normal limits. Menstrual blood removed Cervix: normal appearing cervix without tenderness, discharge or lesions. Uterus:  nonenlarged and non tender Adnexa:  normal adnexa Rectovaginal: deferred  Laboratory: none  Radiology: none  Assessment: patient doing well  Plan:  1. Well woman exam with routine gynecological exam Routine care. Self swab in two weeks to check for BV - Cytology - PAP( )  2. Cervical cancer screening - Cytology - PAP( )  No orders of the defined types were placed in this encounter.   RTC PRN  Durene Romans MD Attending [facility] [employer] [facility])

## 2022-03-23 NOTE — Progress Notes (Signed)
CC: Annual  Denies any concerns  Pt stating that her period started yesterday and has somewhat of a heavy flow today.   STI Screening: Yes per patient

## 2022-03-26 ENCOUNTER — Encounter (HOSPITAL_COMMUNITY): Payer: Self-pay | Admitting: Emergency Medicine

## 2022-03-26 ENCOUNTER — Other Ambulatory Visit: Payer: Self-pay

## 2022-03-26 ENCOUNTER — Emergency Department (HOSPITAL_COMMUNITY)
Admission: EM | Admit: 2022-03-26 | Discharge: 2022-03-27 | Disposition: A | Discharge status: Home / Self Care | Payer: PRIVATE HEALTH INSURANCE | Attending: Emergency Medicine | Admitting: Emergency Medicine

## 2022-03-26 ENCOUNTER — Emergency Department (HOSPITAL_COMMUNITY): Payer: PRIVATE HEALTH INSURANCE

## 2022-03-26 DIAGNOSIS — R000 Tachycardia, unspecified: Secondary | ICD-10-CM | POA: Diagnosis not present

## 2022-03-26 DIAGNOSIS — J45901 Unspecified asthma with (acute) exacerbation: Secondary | ICD-10-CM | POA: Insufficient documentation

## 2022-03-26 DIAGNOSIS — R0602 Shortness of breath: Secondary | ICD-10-CM

## 2022-03-26 DIAGNOSIS — F17200 Nicotine dependence, unspecified, uncomplicated: Secondary | ICD-10-CM | POA: Insufficient documentation

## 2022-03-26 DIAGNOSIS — Z7951 Long term (current) use of inhaled steroids: Secondary | ICD-10-CM | POA: Diagnosis not present

## 2022-03-26 DIAGNOSIS — Z1152 Encounter for screening for COVID-19: Secondary | ICD-10-CM | POA: Diagnosis not present

## 2022-03-26 LAB — CBC WITH DIFFERENTIAL/PLATELET
Abs Immature Granulocytes: 0.03 10*3/uL (ref 0.00–0.07)
Basophils Absolute: 0.1 10*3/uL (ref 0.0–0.1)
Basophils Relative: 1 %
Eosinophils Absolute: 0.4 10*3/uL (ref 0.0–0.5)
Eosinophils Relative: 5 %
HCT: 35.7 % — ABNORMAL LOW (ref 36.0–46.0)
Hemoglobin: 11.2 g/dL — ABNORMAL LOW (ref 12.0–15.0)
Immature Granulocytes: 0 %
Lymphocytes Relative: 19 %
Lymphs Abs: 1.8 10*3/uL (ref 0.7–4.0)
MCH: 27.8 pg (ref 26.0–34.0)
MCHC: 31.4 g/dL (ref 30.0–36.0)
MCV: 88.6 fL (ref 80.0–100.0)
Monocytes Absolute: 0.6 10*3/uL (ref 0.1–1.0)
Monocytes Relative: 7 %
Neutro Abs: 6.6 10*3/uL (ref 1.7–7.7)
Neutrophils Relative %: 68 %
Platelets: 314 10*3/uL (ref 150–400)
RBC: 4.03 MIL/uL (ref 3.87–5.11)
RDW: 13.7 % (ref 11.5–15.5)
WBC: 9.5 10*3/uL (ref 4.0–10.5)
nRBC: 0 % (ref 0.0–0.2)

## 2022-03-26 LAB — RESP PANEL BY RT-PCR (RSV, FLU A&B, COVID)  RVPGX2
Influenza A by PCR: NEGATIVE
Influenza B by PCR: NEGATIVE
Resp Syncytial Virus by PCR: NEGATIVE
SARS Coronavirus 2 by RT PCR: NEGATIVE

## 2022-03-26 LAB — BASIC METABOLIC PANEL
Anion gap: 10 (ref 5–15)
BUN: 7 mg/dL (ref 6–20)
CO2: 23 mmol/L (ref 22–32)
Calcium: 8.9 mg/dL (ref 8.9–10.3)
Chloride: 102 mmol/L (ref 98–111)
Creatinine, Ser: 0.94 mg/dL (ref 0.44–1.00)
GFR, Estimated: >60 mL/min (ref >60)
Glucose, Bld: 124 mg/dL — ABNORMAL HIGH (ref 70–99)
Potassium: 3.9 mmol/L (ref 3.5–5.1)
Sodium: 135 mmol/L (ref 135–145)

## 2022-03-26 LAB — HCG, QUANTITATIVE, PREGNANCY: hCG, Beta Chain, Quant, S: 1 m[IU]/mL (ref <5)

## 2022-03-26 LAB — TROPONIN I (HIGH SENSITIVITY): Troponin I (High Sensitivity): 4 ng/L (ref <18)

## 2022-03-26 MED ORDER — METHYLPREDNISOLONE SODIUM SUCC 125 MG IJ SOLR
125.0000 mg | Freq: Once | INTRAMUSCULAR | Status: AC
  Administered 2022-03-26: 125 mg via INTRAVENOUS
  Filled 2022-03-26: qty 2

## 2022-03-26 MED ORDER — PREDNISONE 10 MG PO TABS: 40.0000 mg | ORAL_TABLET | Freq: Every day | ORAL | 0 refills | Status: AC

## 2022-03-26 MED ORDER — IPRATROPIUM-ALBUTEROL 0.5-2.5 (3) MG/3ML IN SOLN
9.0000 mL | Freq: Once | RESPIRATORY_TRACT | Status: AC
  Administered 2022-03-26: 9 mL via RESPIRATORY_TRACT
  Filled 2022-03-26: qty 9

## 2022-03-26 MED ORDER — ACETAMINOPHEN 500 MG PO TABS
1000.0000 mg | ORAL_TABLET | Freq: Once | ORAL | Status: AC
  Administered 2022-03-26: 1000 mg via ORAL
  Filled 2022-03-26: qty 2

## 2022-03-26 MED ORDER — ALBUTEROL SULFATE HFA 108 (90 BASE) MCG/ACT IN AERS
2.0000 | INHALATION_SPRAY | Freq: Once | RESPIRATORY_TRACT | Status: AC
  Administered 2022-03-26: 2 via RESPIRATORY_TRACT
  Filled 2022-03-26: qty 6.7

## 2022-03-26 MED ORDER — IPRATROPIUM-ALBUTEROL 0.5-2.5 (3) MG/3ML IN SOLN: 3.0000 mL | Freq: Once | RESPIRATORY_TRACT | Status: DC

## 2022-03-26 MED ORDER — LACTATED RINGERS IV BOLUS
1000.0000 mL | Freq: Once | INTRAVENOUS | Status: AC
  Administered 2022-03-26: 1000 mL via INTRAVENOUS

## 2022-03-26 MED ORDER — IPRATROPIUM-ALBUTEROL 0.5-2.5 (3) MG/3ML IN SOLN: 3.0000 mL | RESPIRATORY_TRACT | 0 refills | Status: DC | PRN

## 2022-03-26 MED ORDER — KETOROLAC TROMETHAMINE 15 MG/ML IJ SOLN
15.0000 mg | Freq: Once | INTRAMUSCULAR | Status: AC
  Administered 2022-03-26: 15 mg via INTRAVENOUS
  Filled 2022-03-26: qty 1

## 2022-03-26 NOTE — ED Provider Notes (Signed)
[facility] Provider Note   CSN: [id number] Arrival date & time: 03/26/22  1954     History  Chief Complaint  Patient presents with   Shortness of Breath    Robyn Barker is a 47 y.o. female.  Smoker with PMH of asthma, HLD, depression, anxiety presenting with shortness of breath with productive cough over the past 2 days.  Patient denies smoking for the past 2 years however she has been vaping.  She thought she had allergies earlier this week with sneezing and congestion and clear rhinorrhea however over the past 2 days she has developed cough productive of greenish sputum with increasing shortness of breath and wheezing.  She does not have any breathing treatments at home she can use.  She has not been on any antibiotics or steroids recently.  She denies any leg pain or swelling.  She denies any active chest pain.  She only complains of some mild substernal chest discomfort after coughing fits.  She denies any history of PE or DVT or MI.  She has had no vomiting or diarrhea.   Shortness of Breath      Home Medications Prior to Admission medications   Medication Sig Start Date End Date Taking? Authorizing Provider  predniSONE (DELTASONE) 10 MG tablet Take 4 tablets (40 mg total) by mouth daily for 4 days. 03/27/22 03/31/22 Yes Elgie Congo, MD  albuterol (PROVENTIL) (2.5 MG/3ML) 0.083% nebulizer solution Inhale into the lungs. 12/02/21 12/02/22  [provider]  albuterol (VENTOLIN HFA) 108 (90 Base) MCG/ACT inhaler INHALE 2 PUFFS BY MOUTH EVERY 6 HOURS AS NEEDED FOR WHEEZE OR SHORTNESS OF BREATH 09/01/21   Bedsole, Amy E, MD  azithromycin (ZITHROMAX) 250 MG tablet Take 250 mg by mouth daily. Until 12/04/21 12/02/21   [provider]  clonazePAM (KLONOPIN) 0.5 MG tablet Take 1 tablet (0.5 mg total) by mouth 2 (two) times daily as needed for anxiety. 12/06/21   Bedsole, Amy E, MD  hydrOXYzine (ATARAX) 10 MG tablet TAKE  1 TABLET BY MOUTH DAILY AS NEEDED FOR ANXIETY. 08/15/21   Bedsole, Amy E, MD  ibuprofen (ADVIL) 800 MG tablet TAKE 1 TABLET BY MOUTH EVERY 8 HOURS AS NEEDED FOR PAIN Patient taking differently: Take 800 mg by mouth every 8 (eight) hours as needed for mild pain. 06/16/21   Bedsole, Amy E, MD  ipratropium-albuterol (DUONEB) 0.5-2.5 (3) MG/3ML SOLN Inhale 3 mLs into the lungs every 4 (four) hours as needed. 03/26/22 03/26/23  Elgie Congo, MD  venlafaxine XR (EFFEXOR XR) 37.5 MG 24 hr capsule Take 1 capsule (37.5 mg total) by mouth daily with breakfast. 07/15/21   Bedsole, Amy E, MD  Vitamin D, Ergocalciferol, (DRISDOL) 1.25 MG (50000 UNIT) CAPS capsule Take 1 capsule (50,000 Units total) by mouth every 7 (seven) days. 08/16/21   Jinny Sanders, MD      Allergies    No known allergies    Review of Systems   Review of Systems  Respiratory:  Positive for shortness of breath.     Physical Exam Updated Vital Signs BP 139/79 (BP Location: Right Arm)   Pulse 100   Temp 99.4 F (37.4 C) (Oral)   Resp (!) 24   Ht 5' 1.5" (1.562 m)   Wt 97.5 kg   LMP 03/22/2022   SpO2 100%   BMI 39.97 kg/m  Physical Exam Constitutional: Alert and oriented.  Nontoxic Eyes: Conjunctivae are normal. ENT  Nose: + congestion.      Mouth/Throat: Mucous membranes are moist.      Neck: No stridor. Cardiovascular: S1, S2, mildly tachycardic, regular rhythm, warm well-perfused Respiratory: Mildly tachypneic, diffuse end expiratory wheezing, good aeration throughout, 100% on RA Gastrointestinal: Soft and nontender.  Musculoskeletal: Normal range of motion in all extremities.      Right lower leg: No tenderness or edema.      Left lower leg: No tenderness or edema. Neurologic: Normal speech and language.  Moving all extremities equally.  Sensation grossly intact.  No gross focal neurologic deficits are appreciated. Skin: Skin is warm, dry and intact. No rash noted. Psychiatric: Mood and affect are normal.  Speech and behavior are normal.  ED Results / Procedures / Treatments   Labs (all labs ordered are listed, but only abnormal results are displayed) Labs Reviewed  BASIC METABOLIC PANEL - Abnormal; Notable for the following components:      Result Value   Glucose, Bld 124 (*)    All other components within normal limits  CBC WITH DIFFERENTIAL/PLATELET - Abnormal; Notable for the following components:   Hemoglobin 11.2 (*)    HCT 35.7 (*)    All other components within normal limits  RESP PANEL BY RT-PCR (RSV, FLU A&B, COVID)  RVPGX2  HCG, QUANTITATIVE, PREGNANCY  TROPONIN I (HIGH SENSITIVITY)    EKG EKG Interpretation  Date/Time:  Sunday March 26 2022 20:03:08 EST Ventricular Rate:  96 PR Interval:  126 QRS Duration: 68 QT Interval:  328 QTC Calculation: 414 R Axis:   84 Text Interpretation: Normal sinus rhythm Nonspecific ST and T wave abnormality Abnormal ECG When compared with ECG of 25-May-2007 11:38, PREVIOUS ECG IS PRESENT Confirmed by Georgina Snell [phone]) on 03/26/2022 8:47:17 PM  Radiology DG Chest 2 View  Result Date: 03/26/2022 CLINICAL DATA:  Dyspnea, shortness of breath, cough, and congestion. EXAM: CHEST - 2 VIEW COMPARISON:  11/09/2013. FINDINGS: The heart size and mediastinal contours are within normal limits. Both lungs are clear. No acute osseous abnormality. IMPRESSION: No active cardiopulmonary disease. Electronically Signed   By: Brett Fairy M.D.   On: 03/26/2022 21:19    Procedures .Critical Care  Performed by: Elgie Congo, MD Authorized by: Elgie Congo, MD   Critical care provider statement:    Critical care time (minutes):  30   Critical care was necessary to treat or prevent imminent or life-threatening deterioration of the following conditions:  Respiratory failure   Critical care was time spent personally by me on the following activities:  Development of treatment plan with patient or surrogate, discussions with consultants,  evaluation of patient's response to treatment, examination of patient, ordering and review of laboratory studies, ordering and review of radiographic studies, ordering and performing treatments and interventions, pulse oximetry, re-evaluation of patient's condition, review of old charts and obtaining history from patient or surrogate     Medications Ordered in ED Medications  albuterol (VENTOLIN HFA) 108 (90 Base) MCG/ACT inhaler 2 puff (has no administration in time range)  acetaminophen (TYLENOL) tablet 1,000 mg (has no administration in time range)  methylPREDNISolone sodium succinate (SOLU-MEDROL) 125 mg/2 mL injection 125 mg (125 mg Intravenous Given 03/26/22 2235)  ketorolac (TORADOL) 15 MG/ML injection 15 mg (15 mg Intravenous Given 03/26/22 2237)  ipratropium-albuterol (DUONEB) 0.5-2.5 (3) MG/3ML nebulizer solution 9 mL (9 mLs Nebulization Given 03/26/22 2223)  lactated ringers bolus 1,000 mL (0 mLs Intravenous Stopped 03/26/22 2347)    ED Course/ Medical Decision Making/ A&P  Clinical Course as of 03/26/22 2355  Nancy Fetter Mar 26, 2022  2326 Reassessed patient, she has significantly improved airway movement after 3 DuoNebs and Solu-Medrol.  She is mildly wheezy still on the right middle lobe but she feels like her symptoms have improved.  She is denying any further shortness of breath.  She is mildly tachycardic up to 120 with movement which I suspect is likely secondary to albuterol.  Labs are still pending.  CBC resulted, normal white blood cell count 9.5.  Mild anemia hemoglobin 11.2. [VB]  2352 Reassessed patient heart rate 105.  Resting comfortably satting 98% on room air with improved wheezing.  She would like to go home.  Discharging with 4 days of prednisone and take-home albuterol inhaler and advised close follow-up with PCP and strict return precautions.  Her remaining labs were unremarkable.  Creatinine 0.94.  COVID RSV and flu all negative.  I see troponin 4 and reassuring. [VB]    Clinical  Course User Index [VB] Elgie Congo, MD                            Medical Decision Making  KAYSON BULLIS is a 47 y.o. female.  Smoker with PMH of asthma, HLD, depression, anxiety presenting with shortness of breath with productive cough over the past 2 days.   Patient's presentation seems most consistent with reactive airway disease exacerbation in the setting of vaping as well as possible URI versus bacterial pneumonia among multiple other etiologies.  Chest x-ray obtained and reviewed by me, no consolidation concerning for pneumonia, no pneumothorax.  Plan to obtain basic labs, RVP, troponin due to nonspecific EKG changes on initial EKG however denying any active chest pain at rest, HEART score 3, low suspicion for ACS, high sensitive troponin 4 and reassuring, and DuoNebs with IV Solu-Medrol and Toradol with IV fluids.  Reassessed patient, she has significantly improved airway movement after 3 DuoNebs and Solu-Medrol.  She is mildly wheezy still on the right middle lobe but she feels like her symptoms have improved.  She is denying any further shortness of breath.  She is mildly tachycardic up to 120 with movement which I suspect is likely secondary to albuterol.  Labs are still pending.  CBC resulted, normal white blood cell count 9.5.  Mild anemia hemoglobin 11.2. [VB]  Reassessed patient heart rate 105.  Resting comfortably satting 98% on room air with improved wheezing.  She would like to go home.  Discharging with 4 days of prednisone and take-home albuterol inhaler and advised close follow-up with PCP and strict return precautions.  Her remaining labs were unremarkable.  Creatinine 0.94.  COVID RSV and flu all negative.  I see troponin 4 and reassuring. [VB]   Amount and/or Complexity of Data Reviewed Labs: ordered. Radiology: ordered.  Risk OTC drugs. Prescription drug management.    Final Clinical Impression(s) / ED Diagnoses Final diagnoses:  Exacerbation of  asthma, unspecified asthma severity, unspecified whether persistent  Shortness of breath    Rx / DC Orders ED Discharge Orders          Ordered    predniSONE (DELTASONE) 10 MG tablet  Daily        03/26/22 2317    ipratropium-albuterol (DUONEB) 0.5-2.5 (3) MG/3ML SOLN  Every 4 hours PRN        03/26/22 2317              Elgie Congo, MD  03/26/22 2355  

## 2022-03-26 NOTE — Discharge Instructions (Addendum)
You were seen in the emergency department today for an asthma exacerbation or attack. You were given breathing treatments and your first dose of steroids here. You were prescribed additional steroids (prednisone) to take during the following 4 days to help your symptoms improve. Please take this as directed. Stop vaping and smoking.  Continue to use your albuterol (2-4 puffs) every 4-6 hours as needed and continue to take any other asthma medication that you use.   Please make an appointment with your primary care doctor within one week to assure improvement or resolution in symptoms.  Asthma can get worse after it gets better, sometimes suddenly. If your breathing worsens and does not improve with your inhaler, chest pain worsens, shortness of breath worsens or you need to use your inhaler more than 2 times every 4 hours, you should return to the emergency department.

## 2022-03-26 NOTE — ED Triage Notes (Signed)
Pt c/o shortness of breath at rest and productive cough for approx 2 days. Hx of asthma and endorses relief with rescue inhaler.

## 2022-03-27 ENCOUNTER — Telehealth: Payer: Self-pay | Admitting: Family Medicine

## 2022-03-27 LAB — CYTOLOGY - PAP
Chlamydia: NEGATIVE
Comment: NEGATIVE
Comment: NEGATIVE
Comment: NEGATIVE
Comment: NORMAL
Diagnosis: NEGATIVE
High risk HPV: NEGATIVE
Neisseria Gonorrhea: NEGATIVE
Trichomonas: NEGATIVE

## 2022-03-27 NOTE — Telephone Encounter (Signed)
We have not seen patient since 03/16/22.  She was seen in ER.  I am not sure we can provide work note if we didn't see her.

## 2022-03-27 NOTE — Telephone Encounter (Signed)
Pt came in to the office wanting to see if she can pick up a return to work note for this Friday.  Pt stated that she would like to pick it up on Wednesday and be called once it is ready for pick-up.

## 2022-03-28 ENCOUNTER — Other Ambulatory Visit: Payer: Self-pay | Admitting: Family Medicine

## 2022-03-28 NOTE — Telephone Encounter (Signed)
Transition Care Management Unsuccessful Follow-up Telephone Call  Date of discharge and from where:  [facility] 03-27-22 Dx: Exacerbation of asthma, unspecified asthma severity, unspecified whether persistent  Attempts:  1st Attempt  Reason for unsuccessful TCM follow-up call:  Left voice message   Juanda Crumble LPN [facility] Dial [phone]

## 2022-03-29 NOTE — Telephone Encounter (Signed)
Transition Care Management Follow-up Telephone Call Date of discharge and from where: 03/26/22 [facility] How have you been since you were released from the hospital? Doing much better Any questions or concerns? No  Items Reviewed: Did the pt receive and understand the discharge instructions provided? Yes  Medications obtained and verified? Yes  Other?  NA Any new allergies since your discharge? No  Dietary orders reviewed? No Do you have support at home? Yes   Home Care and Equipment/Supplies: Were home health services ordered? no If so, what is the name of the agency? NA  Has the agency set up a time to come to the patient's home? NA Were any new equipment or medical supplies ordered?  No What is the name of the medical supply agency? NA Were you able to get the supplies/equipment? not applicable Do you have any questions related to the use of the equipment or supplies? No  Functional Questionnaire: (I = Independent and D = Dependent) ADLs: I  Bathing/Dressing- I  Meal Prep- I  Eating- I  Maintaining continence- I  Transferring/Ambulation- I  Managing Meds- I  Follow up appointments reviewed:  PCP Hospital f/u appt confirmed? Yes  Scheduled to see Dr. Diona Browner on 04/04/22 @ 12p. [facility] f/u appt confirmed? No   Are transportation arrangements needed? No  If their condition worsens, is the pt aware to call PCP or go to the Emergency Dept.? Yes Was the patient provided with contact information for the PCP's office or ED? Yes Was to pt encouraged to call back with questions or concerns? Yes

## 2022-04-04 ENCOUNTER — Ambulatory Visit: Payer: No Typology Code available for payment source | Admitting: Family Medicine

## 2022-04-04 ENCOUNTER — Encounter: Payer: Self-pay | Admitting: Family Medicine

## 2022-04-04 VITALS — BP 100/64 | HR 82 | Temp 98.0°F | Ht 61.5 in | Wt 223.0 lb

## 2022-04-04 DIAGNOSIS — J4541 Moderate persistent asthma with (acute) exacerbation: Secondary | ICD-10-CM | POA: Diagnosis not present

## 2022-04-04 DIAGNOSIS — E119 Type 2 diabetes mellitus without complications: Secondary | ICD-10-CM

## 2022-04-04 LAB — POCT GLYCOSYLATED HEMOGLOBIN (HGB A1C): Hemoglobin A1C: 7.6 % — AB (ref 4.0–5.6)

## 2022-04-04 MED ORDER — BUDESONIDE 90 MCG/ACT IN AEPB: INHALATION_SPRAY | RESPIRATORY_TRACT | 11 refills | Status: DC

## 2022-04-04 NOTE — Assessment & Plan Note (Signed)
Acute on chronic issue Acute asthma exacerbation has now resolved status post prednisone.  Given multiple recent exacerbations I have encouraged her to start Flovent 90 mcg inhaled twice daily as a controller and to continue using albuterol as needed either in nebulizer or inhaler form. She will will remain off vaping and cigarette use as this is likely big trigger for her.

## 2022-04-04 NOTE — Progress Notes (Signed)
Patient ID: Robyn Barker, female    DOB: 03-13-1975, 47 y.o.   MRN: [id number]  This visit was conducted in person.  BP 100/64   Pulse 82   Temp 98 F (36.7 C) (Oral)   Ht 5' 1.5" (1.562 m)   Wt 223 lb (101.2 kg)   LMP 03/22/2022   SpO2 95%   BMI 41.45 kg/m    CC:  Chief Complaint  Patient presents with   Asthma    ER follow up asthma exacerbation/SOB    Subjective:   HPI: Robyn Barker is a 47 y.o. female presenting on 04/04/2022 for Asthma (ER follow up asthma exacerbation/SOB)  Moderate persistent asthma: Recent asthma exacerbation took her to the hospital on March 26, 2022.  Reviewed notes in detail. EKG and chest x-ray unremarkable, unremarkable CBC troponin I, negative beta hCG, normal be met, mild anemia COVID, RSV and flu test all negative Treated with Keralac, methylprednisolone, and Combivent nebs Sent home with prednisone, albuterol as needed    She has been vaping.  She denies tobacco use She has now quits vaping.   She reports her SOB is improved. She is back to her basline now.  She is not on an asthma controller but has had to asthma exacerbations and 1 ER visit in the last 6 months..  Diabetes: Recent prednisone use.Marland Kitchen A1C worse compared to 7.3 in 08/2021 Lab Results  Component Value Date   HGBA1C 7.6 (A) 04/04/2022  Using medications without difficulties: Hypoglycemic episodes: Hyperglycemic episodes: Feet problems: Blood Sugars averaging: eye exam within last year:  Wt Readings from Last 3 Encounters:  04/04/22 223 lb (101.2 kg)  03/26/22 215 lb (97.5 kg)  03/23/22 215 lb 9.6 oz (97.8 kg)     Relevant past medical, surgical, family and social history reviewed and updated as indicated. Interim medical history since our last visit reviewed. Allergies and medications reviewed and updated. Outpatient Medications Prior to Visit  Medication Sig Dispense Refill   albuterol (PROVENTIL) (2.5 MG/3ML) 0.083% nebulizer solution Inhale  into the lungs.     albuterol (VENTOLIN HFA) 108 (90 Base) MCG/ACT inhaler INHALE 2 PUFFS BY MOUTH EVERY 6 HOURS AS NEEDED FOR WHEEZE OR SHORTNESS OF BREATH 8.5 each 2   clonazePAM (KLONOPIN) 0.5 MG tablet Take 1 tablet (0.5 mg total) by mouth 2 (two) times daily as needed for anxiety. 15 tablet 0   hydrOXYzine (ATARAX) 10 MG tablet TAKE 1 TABLET BY MOUTH DAILY AS NEEDED FOR ANXIETY. 30 tablet 0   ibuprofen (ADVIL) 800 MG tablet TAKE 1 TABLET BY MOUTH EVERY 8 HOURS AS NEEDED FOR PAIN 30 tablet 3   ipratropium-albuterol (DUONEB) 0.5-2.5 (3) MG/3ML SOLN Inhale 3 mLs into the lungs every 4 (four) hours as needed. 360 mL 0   venlafaxine XR (EFFEXOR XR) 37.5 MG 24 hr capsule Take 1 capsule (37.5 mg total) by mouth daily with breakfast. 30 capsule 3   Vitamin D, Ergocalciferol, (DRISDOL) 1.25 MG (50000 UNIT) CAPS capsule Take 1 capsule (50,000 Units total) by mouth every 7 (seven) days. 12 capsule 0   azithromycin (ZITHROMAX) 250 MG tablet Take 250 mg by mouth daily. Until 12/04/21     No facility-administered medications prior to visit.     Per HPI unless specifically indicated in ROS section below Review of Systems  Constitutional:  Negative for fatigue and fever.  HENT:  Negative for congestion.   Eyes:  Negative for pain.  Respiratory:  Negative for cough and shortness of breath.  Cardiovascular:  Negative for chest pain, palpitations and leg swelling.  Gastrointestinal:  Negative for abdominal pain.  Genitourinary:  Negative for dysuria and vaginal bleeding.  Musculoskeletal:  Negative for back pain.  Neurological:  Negative for syncope, light-headedness and headaches.  Psychiatric/Behavioral:  Negative for dysphoric mood.    Objective:  BP 100/64   Pulse 82   Temp 98 F (36.7 C) (Oral)   Ht 5' 1.5" (1.562 m)   Wt 223 lb (101.2 kg)   LMP 03/22/2022   SpO2 95%   BMI 41.45 kg/m   Wt Readings from Last 3 Encounters:  04/04/22 223 lb (101.2 kg)  03/26/22 215 lb (97.5 kg)  03/23/22  215 lb 9.6 oz (97.8 kg)      Physical Exam Constitutional:      General: She is not in acute distress.    Appearance: Normal appearance. She is well-developed. She is obese. She is not ill-appearing or toxic-appearing.  HENT:     Head: Normocephalic.     Right Ear: Hearing, tympanic membrane, ear canal and external ear normal. Tympanic membrane is not erythematous, retracted or bulging.     Left Ear: Hearing, tympanic membrane, ear canal and external ear normal. Tympanic membrane is not erythematous, retracted or bulging.     Nose: No mucosal edema or rhinorrhea.     Right Sinus: No maxillary sinus tenderness or frontal sinus tenderness.     Left Sinus: No maxillary sinus tenderness or frontal sinus tenderness.     Mouth/Throat:     Pharynx: Uvula midline.  Eyes:     General: Lids are normal. Lids are everted, no foreign bodies appreciated.     Conjunctiva/sclera: Conjunctivae normal.     Pupils: Pupils are equal, round, and reactive to light.  Neck:     Thyroid: No thyroid mass or thyromegaly.     Vascular: No carotid bruit.     Trachea: Trachea normal.  Cardiovascular:     Rate and Rhythm: Normal rate and regular rhythm.     Pulses: Normal pulses.     Heart sounds: Normal heart sounds, S1 normal and S2 normal. No murmur heard.    No friction rub. No gallop.  Pulmonary:     Effort: Pulmonary effort is normal. No tachypnea or respiratory distress.     Breath sounds: Normal breath sounds. No decreased breath sounds, wheezing, rhonchi or rales.  Abdominal:     General: Bowel sounds are normal.     Palpations: Abdomen is soft.     Tenderness: There is no abdominal tenderness.  Musculoskeletal:     Cervical back: Normal range of motion and neck supple.  Skin:    General: Skin is warm and dry.     Findings: No rash.  Neurological:     Mental Status: She is alert.  Psychiatric:        Mood and Affect: Mood is not anxious or depressed.        Speech: Speech normal.         Behavior: Behavior normal. Behavior is cooperative.        Thought Content: Thought content normal.        Judgment: Judgment normal.       Results for orders placed or performed in visit on 04/04/22  POCT glycosylated hemoglobin (Hb A1C)  Result Value Ref Range   Hemoglobin A1C 7.6 (A) 4.0 - 5.6 %   HbA1c POC (<> result, manual entry)     HbA1c, POC (prediabetic range)  HbA1c, POC (controlled diabetic range)      Assessment and Plan  Controlled type 2 diabetes mellitus without complication, without long-term current use of insulin (HCC) Assessment & Plan: Chronic, previously better controlled but has received multiple courses of prednisone as well as injected methylprednisolone in the last 3 months. She will get back on track with low carbohydrate diet and regular exercise.  We did discuss how GLP-1 medications would be ideal treatment for this patient to help both with her diabetes control and her weight management.  She will look into these options and will contact me if she would like to start 1.  Orders: -     POCT glycosylated hemoglobin (Hb A1C)  Moderate persistent asthma with exacerbation Assessment & Plan: Acute on chronic issue Acute asthma exacerbation has now resolved status post prednisone.  Given multiple recent exacerbations I have encouraged her to start Flovent 90 mcg inhaled twice daily as a controller and to continue using albuterol as needed either in nebulizer or inhaler form. She will will remain off vaping and cigarette use as this is likely big trigger for her.   Other orders -     Budesonide; 1 inhalation twice daily  Dispense: 1 each; Refill: 11    Return in about 3 months (around 07/03/2022) for diabetes follow up.   Eliezer Lofts, MD

## 2022-04-04 NOTE — Assessment & Plan Note (Signed)
Chronic, previously better controlled but has received multiple courses of prednisone as well as injected methylprednisolone in the last 3 months. She will get back on track with low carbohydrate diet and regular exercise.  We did discuss how GLP-1 medications would be ideal treatment for this patient to help both with her diabetes control and her weight management.  She will look into these options and will contact me if she would like to start 1.

## 2022-04-04 NOTE — Patient Instructions (Addendum)
Work on low carbohydrate diet. Increase exercsie and work on [employer].  Start budesonide inhaler twice daily to prevent asthma episodes.  Use albuterol inhaler or neb as need for wheeze and shortness of breath.   Look into Mounjaro, Ozempic or  Trulicity.. for inc=surance coverage... if you want to start this  sen d message via Mychart.

## 2022-04-09 ENCOUNTER — Encounter: Payer: Self-pay | Admitting: Family Medicine

## 2022-04-10 ENCOUNTER — Encounter: Payer: Self-pay | Admitting: Family Medicine

## 2022-04-11 MED ORDER — OZEMPIC (0.25 OR 0.5 MG/DOSE) 2 MG/3ML ~~LOC~~ SOPN: 0.2500 mg | PEN_INJECTOR | SUBCUTANEOUS | 11 refills | Status: DC

## 2022-04-18 ENCOUNTER — Encounter: Payer: Self-pay | Admitting: Family Medicine

## 2022-04-20 ENCOUNTER — Encounter: Payer: Self-pay | Admitting: Family Medicine

## 2022-04-22 ENCOUNTER — Other Ambulatory Visit: Payer: Self-pay | Admitting: Family Medicine

## 2022-04-23 NOTE — Telephone Encounter (Signed)
Last office visit 04/04/22 for DM and Asthma.  Last refilled 06/16/21 for #30 with 3 refills.  Next Appt: 07/05/22 for DM.

## 2022-04-25 ENCOUNTER — Ambulatory Visit (INDEPENDENT_AMBULATORY_CARE_PROVIDER_SITE_OTHER): Payer: No Typology Code available for payment source | Admitting: Family Medicine

## 2022-04-25 ENCOUNTER — Encounter: Payer: Self-pay | Admitting: Family Medicine

## 2022-04-25 VITALS — BP 100/78 | HR 96 | Temp 97.4°F | Ht 61.5 in | Wt 217.1 lb

## 2022-04-25 DIAGNOSIS — E119 Type 2 diabetes mellitus without complications: Secondary | ICD-10-CM | POA: Diagnosis not present

## 2022-04-25 DIAGNOSIS — G8929 Other chronic pain: Secondary | ICD-10-CM | POA: Insufficient documentation

## 2022-04-25 DIAGNOSIS — M545 Low back pain, unspecified: Secondary | ICD-10-CM

## 2022-04-25 NOTE — Assessment & Plan Note (Addendum)
Chronic, inadequate control  Has been on starting dose ozempic in the last  2 weeks with significant benefit including  early satiety and decreased appetite.  She has been able to work successfully on modification of her diet, low-carb. She has lost 5 pounds. It is too early for recheck of A1c but fasting blood sugars seem to be gradually trending down.  Given some GI side effect we will hold off on titrating up the dose at this time.  She will follow-up in 2 months as planned for repeat check of A1c.

## 2022-04-25 NOTE — Assessment & Plan Note (Signed)
Chronic, gradually improving with home physical therapy and strengthening exercises.  She feels capable of returning to work with no restrictions.  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 Reviewed safe back hygiene.  Released to return to work full-time without restrictions.
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
## 2022-05-19 ENCOUNTER — Telehealth: Payer: Self-pay | Admitting: Pharmacy Technician

## 2022-05-19 NOTE — Telephone Encounter (Signed)
Patient Advocate Encounter   Received notification that prior authorization for Ozempic (0.25 or 0.5 MG/DOSE) 2MG /3ML pen-injectors is required.   PA submitted on 05/19/2022 [employer] Electronic PA Form Status is pending

## 2022-06-13 ENCOUNTER — Other Ambulatory Visit (HOSPITAL_COMMUNITY): Payer: Self-pay

## 2022-06-13 ENCOUNTER — Encounter: Payer: Self-pay | Admitting: Family Medicine

## 2022-06-13 ENCOUNTER — Telehealth: Payer: Self-pay | Admitting: Family Medicine

## 2022-06-13 NOTE — Telephone Encounter (Signed)
error 

## 2022-06-13 NOTE — Telephone Encounter (Signed)
Patient contacted the office and stated she has spoken with Dr. Ermalene Searing at her last appt about increasing her Ozempic dosage. Said at the time she did not want to increase it but now she feels like she is ready to increase it. Wants to know if this can be done for the next time she goes to pick it up. If needed, please advise [phone].

## 2022-06-13 NOTE — Telephone Encounter (Signed)
Submitted PA via [email or web address]  Prior Auth (EOC) ID: [id number]

## 2022-06-13 NOTE — Telephone Encounter (Signed)
Left message for Robyn Barker that the PA for her Ozempic has been approved.

## 2022-06-13 NOTE — Telephone Encounter (Signed)
Pharmacy Patient Advocate Encounter  Prior Authorization for Franki Monte has been approved by [employer]   PA # [id number] Effective dates: 06/13/2022 through 06/12/2023

## 2022-06-13 NOTE — Telephone Encounter (Signed)
Call Verify current dose is semaglutide 0.25 mg weekly... If so okay to send in prescription for semaglutide 0.5 mg weekly #3 ml, 11 RF

## 2022-06-14 MED ORDER — OZEMPIC (0.25 OR 0.5 MG/DOSE) 2 MG/3ML ~~LOC~~ SOPN: 0.5000 mg | PEN_INJECTOR | SUBCUTANEOUS | 11 refills | Status: DC

## 2022-06-14 NOTE — Addendum Note (Signed)
Addended by: Damita Lack on: 06/14/2022 09:04 AM   Modules accepted: Orders

## 2022-06-14 NOTE — Telephone Encounter (Signed)
Spoke with Robyn Barker and verified her current dose of Ozempic is 0.25 mg.  New Rx sent in for 0.5 mg dose as instructed by Dr. Ermalene Searing.

## 2022-07-05 ENCOUNTER — Ambulatory Visit: Payer: PRIVATE HEALTH INSURANCE | Admitting: Family Medicine

## 2022-07-05 ENCOUNTER — Encounter: Payer: Self-pay | Admitting: Family Medicine

## 2022-07-05 ENCOUNTER — Ambulatory Visit (INDEPENDENT_AMBULATORY_CARE_PROVIDER_SITE_OTHER): Payer: PRIVATE HEALTH INSURANCE | Admitting: Family Medicine

## 2022-07-05 VITALS — BP 110/80 | HR 92 | Temp 97.2°F | Ht 61.5 in | Wt 208.0 lb

## 2022-07-05 DIAGNOSIS — E119 Type 2 diabetes mellitus without complications: Secondary | ICD-10-CM | POA: Diagnosis not present

## 2022-07-05 DIAGNOSIS — Z7984 Long term (current) use of oral hypoglycemic drugs: Secondary | ICD-10-CM | POA: Diagnosis not present

## 2022-07-05 LAB — POCT GLYCOSYLATED HEMOGLOBIN (HGB A1C): Hemoglobin A1C: 6.1 % — AB (ref 4.0–5.6)

## 2022-07-05 NOTE — Progress Notes (Signed)
Patient ID: Robyn Barker, female    DOB: 09-12-1975, 47 y.o.   MRN: [id number]  This visit was conducted in person.  BP 110/80 (BP Location: Left Arm, Patient Position: Sitting, Cuff Size: Normal)   Pulse 92   Temp (!) 97.2 F (36.2 C) (Temporal)   Ht 5' 1.5" (1.562 m)   Wt 208 lb (94.3 kg)   SpO2 98%   BMI 38.66 kg/m    CC:  Chief Complaint  Patient presents with  . Diabetes    Subjective:   HPI: Robyn Barker is a 47 y.o. female presenting on 07/05/2022 for Diabetes  On ozempic  0.5 mg weekly for diabetes control ( has not SE except episode of nausea), increased the dose 06/13/2022  Decreased appetite, staying away from sweets.   No SE such as N/V/C/D no abdominal pain.   FBS 145 Lab Results  Component Value Date   HGBA1C 6.1 (A) 07/05/2022   Wt Readings from Last 3 Encounters:  07/05/22 208 lb (94.3 kg)  04/25/22 217 lb 2 oz (98.5 kg)  04/04/22 223 lb (101.2 kg)   BP Readings from Last 3 Encounters:  07/05/22 110/80  04/25/22 100/78  04/04/22 100/64     Relevant past medical, surgical, family and social history reviewed and updated as indicated. Interim medical history since our last visit reviewed. Allergies and medications reviewed and updated. Outpatient Medications Prior to Visit  Medication Sig Dispense Refill  . albuterol (PROVENTIL) (2.5 MG/3ML) 0.083% nebulizer solution Inhale into the lungs.    Marland Kitchen albuterol (VENTOLIN HFA) 108 (90 Base) MCG/ACT inhaler INHALE 2 PUFFS BY MOUTH EVERY 6 HOURS AS NEEDED FOR WHEEZE OR SHORTNESS OF BREATH 8.5 each 2  . Budesonide 90 MCG/ACT inhaler 1 inhalation twice daily 1 each 11  . clonazePAM (KLONOPIN) 0.5 MG tablet Take 1 tablet (0.5 mg total) by mouth 2 (two) times daily as needed for anxiety. 15 tablet 0  . hydrOXYzine (ATARAX) 10 MG tablet TAKE 1 TABLET BY MOUTH DAILY AS NEEDED FOR ANXIETY. 30 tablet 0  . ibuprofen (ADVIL) 800 MG tablet TAKE 1 TABLET BY MOUTH EVERY 8 HOURS AS NEEDED FOR PAIN 30 tablet 3  .  ipratropium-albuterol (DUONEB) 0.5-2.5 (3) MG/3ML SOLN Inhale 3 mLs into the lungs every 4 (four) hours as needed. 360 mL 0  . Semaglutide,0.25 or 0.5MG /DOS, (OZEMPIC, 0.25 OR 0.5 MG/DOSE,) 2 MG/3ML SOPN Inject 0.5 mg into the skin once a week. 3 mL 11  . venlafaxine XR (EFFEXOR XR) 37.5 MG 24 hr capsule Take 1 capsule (37.5 mg total) by mouth daily with breakfast. 30 capsule 3  . Vitamin D, Ergocalciferol, (DRISDOL) 1.25 MG (50000 UNIT) CAPS capsule Take 1 capsule (50,000 Units total) by mouth every 7 (seven) days. 12 capsule 0   No facility-administered medications prior to visit.     Per HPI unless specifically indicated in ROS section below Review of Systems Objective:  BP 110/80 (BP Location: Left Arm, Patient Position: Sitting, Cuff Size: Normal)   Pulse 92   Temp (!) 97.2 F (36.2 C) (Temporal)   Ht 5' 1.5" (1.562 m)   Wt 208 lb (94.3 kg)   SpO2 98%   BMI 38.66 kg/m   Wt Readings from Last 3 Encounters:  07/05/22 208 lb (94.3 kg)  04/25/22 217 lb 2 oz (98.5 kg)  04/04/22 223 lb (101.2 kg)      Physical Exam Constitutional:      General: She is not in acute distress.  Appearance: Normal appearance. She is well-developed. She is not ill-appearing or toxic-appearing.  HENT:     Head: Normocephalic.     Right Ear: Hearing, tympanic membrane, ear canal and external ear normal. Tympanic membrane is not erythematous, retracted or bulging.     Left Ear: Hearing, tympanic membrane, ear canal and external ear normal. Tympanic membrane is not erythematous, retracted or bulging.     Nose: No mucosal edema or rhinorrhea.     Right Sinus: No maxillary sinus tenderness or frontal sinus tenderness.     Left Sinus: No maxillary sinus tenderness or frontal sinus tenderness.     Mouth/Throat:     Pharynx: Uvula midline.  Eyes:     General: Lids are normal. Lids are everted, no foreign bodies appreciated.     Conjunctiva/sclera: Conjunctivae normal.     Pupils: Pupils are equal, round,  and reactive to light.  Neck:     Thyroid: No thyroid mass or thyromegaly.     Vascular: No carotid bruit.     Trachea: Trachea normal.  Cardiovascular:     Rate and Rhythm: Normal rate and regular rhythm.     Pulses: Normal pulses.     Heart sounds: Normal heart sounds, S1 normal and S2 normal. No murmur heard.    No friction rub. No gallop.  Pulmonary:     Effort: Pulmonary effort is normal. No tachypnea or respiratory distress.     Breath sounds: Normal breath sounds. No decreased breath sounds, wheezing, rhonchi or rales.  Abdominal:     General: Bowel sounds are normal.     Palpations: Abdomen is soft.     Tenderness: There is no abdominal tenderness.  Musculoskeletal:     Cervical back: Normal, normal range of motion and neck supple.     Thoracic back: Normal. No spasms, tenderness or bony tenderness. Normal range of motion.     Lumbar back: Normal. No tenderness or bony tenderness. Normal range of motion. Negative right straight leg raise test and negative left straight leg raise test.  Skin:    General: Skin is warm and dry.     Findings: No rash.  Neurological:     Mental Status: She is alert.  Psychiatric:        Mood and Affect: Mood is not anxious or depressed.        Speech: Speech normal.        Behavior: Behavior normal. Behavior is cooperative.        Thought Content: Thought content normal.        Judgment: Judgment normal.      Results for orders placed or performed in visit on 07/05/22  POCT glycosylated hemoglobin (Hb A1C)  Result Value Ref Range   Hemoglobin A1C 6.1 (A) 4.0 - 5.6 %   HbA1c POC (<> result, manual entry)     HbA1c, POC (prediabetic range)     HbA1c, POC (controlled diabetic range)      Assessment and Plan  Controlled type 2 diabetes mellitus without complication, without long-term current use of insulin (HCC) -     POCT glycosylated hemoglobin (Hb A1C)    No follow-ups on file.   Kerby Nora, MD

## 2022-07-06 NOTE — Assessment & Plan Note (Addendum)
Excellent improvement in A1c with response to GLP-1 medication. On increased dose Ozempic in the last  2 weeks with significant benefit including  early satiety and decreased appetite.  She has been able to work successfully on modification of her diet, low-carb. She has had significant weight loss.  Will continue current dose of Ozempic Ozempic 0.5 mg weekly.  Follow-up in 6 months

## 2022-07-18 ENCOUNTER — Telehealth: Payer: Self-pay

## 2022-07-18 NOTE — Telephone Encounter (Signed)
Okay to set up the appointment as requested

## 2022-07-18 NOTE — Telephone Encounter (Signed)
Patient called to set up PPD test only. Ok to schedule for patient. She states she only needs the test no other ppw needed.

## 2022-07-19 NOTE — Telephone Encounter (Signed)
Please schedule nurse visit for PPD.  (Can't be scheduled on a Thursday).

## 2022-07-19 NOTE — Telephone Encounter (Signed)
LVMTCB and sched, sent mychart message 

## 2022-08-09 ENCOUNTER — Other Ambulatory Visit (HOSPITAL_COMMUNITY): Payer: Self-pay

## 2022-08-18 ENCOUNTER — Encounter: Payer: No Typology Code available for payment source | Admitting: Family Medicine

## 2022-10-31 ENCOUNTER — Emergency Department (HOSPITAL_COMMUNITY)
Admission: EM | Admit: 2022-10-31 | Discharge: 2022-10-31 | Disposition: A | Discharge status: Home / Self Care | Payer: PRIVATE HEALTH INSURANCE | Attending: Emergency Medicine | Admitting: Emergency Medicine

## 2022-10-31 ENCOUNTER — Other Ambulatory Visit: Payer: Self-pay

## 2022-10-31 ENCOUNTER — Emergency Department (HOSPITAL_COMMUNITY): Payer: PRIVATE HEALTH INSURANCE

## 2022-10-31 DIAGNOSIS — R002 Palpitations: Secondary | ICD-10-CM | POA: Diagnosis present

## 2022-10-31 DIAGNOSIS — I471 Supraventricular tachycardia, unspecified: Secondary | ICD-10-CM | POA: Diagnosis not present

## 2022-10-31 LAB — COMPREHENSIVE METABOLIC PANEL
ALT: 33 U/L (ref 0–44)
AST: 43 U/L — ABNORMAL HIGH (ref 15–41)
Albumin: 3.6 g/dL (ref 3.5–5.0)
Alkaline Phosphatase: 47 U/L (ref 38–126)
Anion gap: 12 (ref 5–15)
BUN: 11 mg/dL (ref 6–20)
CO2: 19 mmol/L — ABNORMAL LOW (ref 22–32)
Calcium: 8.5 mg/dL — ABNORMAL LOW (ref 8.9–10.3)
Chloride: 108 mmol/L (ref 98–111)
Creatinine, Ser: 0.98 mg/dL (ref 0.44–1.00)
GFR, Estimated: >60 mL/min (ref >60)
Glucose, Bld: 102 mg/dL — ABNORMAL HIGH (ref 70–99)
Potassium: 3.7 mmol/L (ref 3.5–5.1)
Sodium: 139 mmol/L (ref 135–145)
Total Bilirubin: 0.9 mg/dL (ref 0.3–1.2)
Total Protein: 6.8 g/dL (ref 6.5–8.1)

## 2022-10-31 LAB — CBC WITH DIFFERENTIAL/PLATELET
Abs Immature Granulocytes: 0.02 10*3/uL (ref 0.00–0.07)
Basophils Absolute: 0.1 10*3/uL (ref 0.0–0.1)
Basophils Relative: 1 %
Eosinophils Absolute: 0.6 10*3/uL — ABNORMAL HIGH (ref 0.0–0.5)
Eosinophils Relative: 8 %
HCT: 37.4 % (ref 36.0–46.0)
Hemoglobin: 11.2 g/dL — ABNORMAL LOW (ref 12.0–15.0)
Immature Granulocytes: 0 %
Lymphocytes Relative: 29 %
Lymphs Abs: 2.3 10*3/uL (ref 0.7–4.0)
MCH: 25.3 pg — ABNORMAL LOW (ref 26.0–34.0)
MCHC: 29.9 g/dL — ABNORMAL LOW (ref 30.0–36.0)
MCV: 84.4 fL (ref 80.0–100.0)
Monocytes Absolute: 0.4 10*3/uL (ref 0.1–1.0)
Monocytes Relative: 5 %
Neutro Abs: 4.6 10*3/uL (ref 1.7–7.7)
Neutrophils Relative %: 57 %
Platelets: 351 10*3/uL (ref 150–400)
RBC: 4.43 MIL/uL (ref 3.87–5.11)
RDW: 15 % (ref 11.5–15.5)
WBC: 8 10*3/uL (ref 4.0–10.5)
nRBC: 0 % (ref 0.0–0.2)

## 2022-10-31 LAB — MAGNESIUM: Magnesium: 1.8 mg/dL (ref 1.7–2.4)

## 2022-10-31 MED ORDER — LACTATED RINGERS IV BOLUS
1000.0000 mL | Freq: Once | INTRAVENOUS | Status: AC
  Administered 2022-10-31: 1000 mL via INTRAVENOUS

## 2022-10-31 MED ORDER — METOPROLOL TARTRATE 25 MG PO TABS
12.5000 mg | ORAL_TABLET | ORAL | 0 refills | Status: DC | PRN
Start: 2022-10-31 — End: 2023-06-05

## 2022-10-31 NOTE — ED Provider Notes (Signed)
[place] EMERGENCY DEPARTMENT AT [facility] Provider Note   CSN: [id number] Arrival date & time: 10/31/22  1627     History Chief Complaint  Patient presents with   Tachycardia    HPI Robyn Barker is a 47 y.o. female presenting for chief complaint of palpitations. 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 EMS was called and patient required treatment with 6 mg adenosine for heart rate to 220 and blood pressure 80/40.  Patient was allegedly cool and clammy during this event.  She has had significant symptomatic improvement since treatment with EMS.  The patient reports that this has happened at least once before, approximately six months ago. The patient has not been treated for these episodes in the past, but their doctor advised them to avoid caffeine, which seemed to help. The patient was prescribed hydroxyzine for anxiety but has not used it.  During the episode, the patient experienced shortness of breath and felt winded. The patient denies any current shortness of breath. The patient's medical history includes taking Ozempic once a week and consuming strawberry seed milk daily, as well as a [employer] supplement. The patient has been advised to discontinue all supplements until further evaluation. The patient does not currently see a cardiologist. Currently asymptomatic.  Patient's recorded medical, surgical, social, medication list and allergies were reviewed in the Snapshot window as part of the initial history.   Review of Systems   Review of Systems  Constitutional:  Negative for chills and fever.  HENT:  Negative for ear pain and sore throat.   Eyes:  Negative for pain and visual disturbance.  Respiratory:  Negative for cough and shortness of breath.   Cardiovascular:  Negative for chest pain and palpitations.  Gastrointestinal:  Negative for abdominal pain and  vomiting.  Genitourinary:  Negative for dysuria and hematuria.  Musculoskeletal:  Negative for arthralgias and back pain.  Skin:  Negative for color change and rash.  Neurological:  Negative for seizures and syncope.  All other systems reviewed and are negative.   Physical Exam Updated Vital Signs BP 109/76 (BP Location: Right Arm)   Pulse (!) 119   Temp 98.5 F (36.9 C) (Oral)   Resp 16   Ht 5\' 1"  (1.549 m)   Wt 94 kg   SpO2 100%   BMI 39.16 kg/m  Physical Exam Vitals and nursing note reviewed.  Constitutional:      General: She is not in acute distress.    Appearance: She is well-developed.  HENT:     Head: Normocephalic and atraumatic.  Eyes:     Conjunctiva/sclera: Conjunctivae normal.  Cardiovascular:     Rate and Rhythm: Normal rate and regular rhythm.     Heart sounds: No murmur heard. Pulmonary:     Effort: Pulmonary effort is normal. No respiratory distress.     Breath sounds: Normal breath sounds.  Abdominal:     General: There is no distension.     Palpations: Abdomen is soft.     Tenderness: There is no abdominal tenderness. There is no right CVA tenderness or left CVA tenderness.  Musculoskeletal:        General: No swelling or tenderness. Normal range of motion.     Cervical back: Neck supple.  Skin:    General: Skin is warm and dry.  Neurological:     General: No focal deficit present.  Mental Status: She is alert and oriented to person, place, and time. Mental status is at baseline.     Cranial Nerves: No cranial nerve deficit.      ED Course/ Medical Decision Making/ A&P Clinical Course as of 10/31/22 2052  Tue Oct 31, 2022  1812 Electrolytes ok. HR still elevated.  [CC]    Clinical Course User Index [CC] Glyn Ade, MD    Procedures Procedures   Medications Ordered in ED Medications  lactated ringers bolus 1,000 mL (1,000 mLs Intravenous New Bag/Given 10/31/22 1712)    Medical Decision Making:    Robyn Barker is a  47 y.o. female who presented to the ED today with palpitations detailed above.     Handoff received from EMS.  Patient placed on continuous vitals and telemetry monitoring while in ED which was reviewed periodically.   Complete initial physical exam performed, notably the patient  was slightly tachycardic to 120 otherwise in no acute distress.      Reviewed and confirmed nursing documentation for past medical history, family history, social history.    Initial Assessment:   With the patient's presentation of palpitations, most likely diagnosis is SVT episode likely acute on chronic. Other diagnoses were considered including (but not limited to) ACS, PE, pneumonia, pneumothorax, metabolic disruption as underlying etiologies for her SVT. These are considered less likely due to history of present illness and physical exam findings.   This is most consistent with an acute life/limb threatening illness complicated by underlying chronic conditions. Patient is PERC positive because of heart rate Patient is otherwise without risk factors for pulmonary embolism therefore at low Wells score Concerning ACS, patient has no independent risk factors other than obesity and would otherwise be low hear score (less than 3) Initial Plan:  Considered further working up ACS and pulmonary embolism, however patient is completely asymptomatic at this time.  Shared medical decision making with patient regarding further evaluation for these conditions.  Given her prolonged episode of SVT that required cardioversion, she is very likely to have a positive troponin and a positive D-dimer incidentally without underlying ACS or PE.  Offered performing the studies and appropriate follow-up studies if positive versus observational care for further symptom development and patient was in agreement for observational care on telemetry at this time with plan for returning to emergency department for further care if symptoms  recur. Screening labs including CBC and Metabolic panel to evaluate for infectious or metabolic etiology of disease.  Urinalysis with reflex culture ordered to evaluate for UTI or relevant urologic/nephrologic pathology.  CXR to evaluate for structural/infectious intrathoracic pathology.  EKG to evaluate for cardiac pathology. Objective evaluation as below reviewed with plan for close reassessment  Initial Study Results:   Laboratory  All laboratory results reviewed without evidence of clinically relevant pathology.    EKG EKG was reviewed independently. Rate, rhythm, axis, intervals all examined and without medically relevant abnormality. ST segments without concerns for elevations.    Radiology  All images reviewed independently. Agree with radiology report at this time.   DG Chest Portable 1 View  Result Date: 10/31/2022 CLINICAL DATA:  Shortness of breath, chest pain. EXAM: PORTABLE CHEST 1 VIEW COMPARISON:  March 26, 2022. FINDINGS: The heart size and mediastinal contours are within normal limits. Both lungs are clear. The visualized skeletal structures are unremarkable. IMPRESSION: No active disease. Electronically Signed   By: Lupita Raider M.D.   On: 10/31/2022 18:34     Consults:  Case  discussed with .   Reassessment and Plan:   On reassessment patient remains completely asymptomatic. Patient states that she would like to be discharged given resolution of all symptoms.  Given no focal pathology this is reasonable heart rate has returned to mid 80s after IV fluids likely dehydration given a difficult workday per patient. She will have a as needed metoprolol prescription she can use and will follow-up with cardiology instructed to return to the emergency department if she has to use metoprolol before she meets her cardiology team.   Disposition:  I have considered need for hospitalization, however, considering all of the above, I believe this patient is stable for discharge at  this time.  Patient/family educated about specific return precautions for given chief complaint and symptoms.  Patient/family educated about follow-up with PCP.     Patient/family expressed understanding of return precautions and need for follow-up. Patient spoken to regarding all imaging and laboratory results and appropriate follow up for these results. All education provided in verbal form with additional information in written form. Time was allowed for answering of patient questions. Patient discharged.    Emergency Department Medication Summary:   Medications  lactated ringers bolus 1,000 mL (1,000 mLs Intravenous New Bag/Given 10/31/22 1712)         Clinical Impression: No diagnosis found.   Data Unavailable   Final Clinical Impression(s) / ED Diagnoses Final diagnoses:  None    Rx / DC Orders ED Discharge Orders     None         Glyn Ade, MD 10/31/22 2349

## 2022-10-31 NOTE — ED Triage Notes (Signed)
Pt to the ed from work with a CC of SVT pt was having sob with chest pain. Pt hr was 240 with a low BP. Pt was given a fluid bolus and 6 mg of adenosine. That converted her HR to SR. Pt denies any current cp, dizziness.

## 2022-12-06 ENCOUNTER — Encounter: Payer: Self-pay | Admitting: Cardiology

## 2022-12-06 ENCOUNTER — Ambulatory Visit: Payer: PRIVATE HEALTH INSURANCE | Attending: Cardiology | Admitting: Cardiology

## 2022-12-06 VITALS — BP 108/72 | HR 106 | Resp 16 | Ht 61.0 in | Wt 207.6 lb

## 2022-12-06 DIAGNOSIS — I471 Supraventricular tachycardia, unspecified: Secondary | ICD-10-CM | POA: Diagnosis not present

## 2022-12-06 DIAGNOSIS — R0609 Other forms of dyspnea: Secondary | ICD-10-CM

## 2022-12-06 DIAGNOSIS — E119 Type 2 diabetes mellitus without complications: Secondary | ICD-10-CM | POA: Diagnosis not present

## 2022-12-06 DIAGNOSIS — E7800 Pure hypercholesterolemia, unspecified: Secondary | ICD-10-CM | POA: Diagnosis not present

## 2022-12-06 DIAGNOSIS — R9431 Abnormal electrocardiogram [ECG] [EKG]: Secondary | ICD-10-CM

## 2022-12-06 MED ORDER — ATORVASTATIN CALCIUM 10 MG PO TABS
10.0000 mg | ORAL_TABLET | Freq: Every day | ORAL | 0 refills | Status: DC
Start: 2022-12-06 — End: 2023-07-18

## 2022-12-06 NOTE — Patient Instructions (Signed)
Medication Instructions:  Your physician has recommended you make the following change in your medication:   1) START atorvastatin 10 mg daily  *If you need a refill on your cardiac medications before your next appointment, please call your pharmacy*  Lab Work: None ordered today.  Testing/Procedures: Your physician has requested that you have en exercise stress myoview. For further information please visit [email or web address] Please follow instruction sheet, as given.   Your physician has requested that you have an echocardiogram. Echocardiography is a painless test that uses sound waves to create images of your heart. It provides your doctor with information about the size and shape of your heart and how well your heart's chambers and valves are working. This procedure takes approximately one hour. There are no restrictions for this procedure. Please do NOT wear cologne, perfume, aftershave, or lotions (deodorant is allowed). Please arrive 15 minutes prior to your appointment time.  Follow-Up: At [facility], you and your health needs are our priority.  As part of our continuing mission to provide you with exceptional heart care, we have created designated Provider Care Teams.  These Care Teams include your primary Cardiologist (physician) and Advanced Practice Providers (APPs -  Physician Assistants and Nurse Practitioners) who all work together to provide you with the care you need, when you need it.  Your next appointment:   As needed  The format for your next appointment:   In Person  Provider:   Yates Decamp, MD {  Other Instructions You have been referred to our electrophysiologist, Dr. Nobie Putnam, to discuss SVT ablation. A scheduler will call you to schedule an appointment with him.   Exercise Myoview (Stress Test) Instructions  Please arrive 15 minutes prior to your appointment time for registration and insurance purposes.   The test will take approximately 3 to 4  hours to complete; you may bring reading material.  If someone comes with you to your appointment, they will need to remain in the main lobby due to limited space in the testing area. **If you are pregnant or breastfeeding, please notify the nuclear lab prior to your appointment**   How to prepare for your Myocardial Perfusion Test: Do not eat or drink 3 hours prior to your test, except you may have water. Do not consume products containing caffeine (regular or decaffeinated) 12 hours prior to your test. (ex: coffee, chocolate, sodas, tea). Do bring a list of your current medications with you.  If not listed below, you may take your medications as normal. Do wear comfortable clothes (no dresses or overalls) and walking shoes, tennis shoes preferred (No heels or open toe shoes are allowed). Do NOT wear cologne, perfume, aftershave, or lotions (deodorant is allowed). If these instructions are not followed, your test will have to be rescheduled.   Please report to [address], [address] for your test.  If you have questions or concerns about your appointment, you can call the Nuclear Lab at [phone].   If you cannot keep your appointment, please provide 24 hours notification to the Nuclear Lab, to avoid a possible $50 charge to your account.

## 2022-12-06 NOTE — Progress Notes (Signed)
Cardiology Office Note:  .   Date:  12/06/2022  ID:  Robyn Barker, DOB Mar 30, 1975, MRN [id number] PCP: Excell Seltzer, MD  [facility] Providers Cardiologist:  Yates Decamp, MD    History of Present Illness: .   Robyn Barker is a 47 y.o. Patient activated difficult EMS on 10/31/2022 with chest pain and palpitations, was found to be in SVT at the rate of 280 to 300 bpm with secondary diffuse global ST-T wave changes.  Discussed the use of AI scribe software for clinical note transcription with the patient, who gave verbal consent to proceed.  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 She first noticed these symptoms over a year ago, with heart rates reaching up to 118-120 bpm, particularly after caffeine consumption. The patient managed these symptoms by reducing caffeine intake and increasing water consumption, which seemed to alleviate the symptoms for a while.  However, about three to four weeks ago, the patient experienced a severe episode of heart racing while at work. She described a sensation of her heart "doing circles," with a heart rate recorded at 250 bpm. Attempts to alleviate the symptoms with Tums and aspirin were unsuccessful. Emergency medical services were called, and the patient was taken to the hospital with a blood pressure of 80/40. The patient's heart rate was brought down through a procedure in the ambulance involving an IV medication.  The patient also has a history of smoking, having smoked half a pack a day for over 20 years, but quit two years ago. She briefly tried vaping but stopped due to triggering asthma symptoms. The patient also has a history of high cholesterol, which was previously managed with atorvastatin. However, the patient stopped taking the medication, and recent tests show that her cholesterol levels are high again. The patient is currently on Ozempic for weight  management and has lost weight but wishes to lose more.      Review of Systems  Cardiovascular:  Positive for palpitations. Negative for chest pain, dyspnea on exertion and leg swelling.   Risk Assessment/Calculations:    Lab Results  Component Value Date   CHOL 169 08/16/2021   HDL 38.70 (L) 08/16/2021   LDLCALC 76 05/10/2021   LDLDIRECT 100.0 08/16/2021   TRIG 201.0 (H) 08/16/2021   CHOLHDL 4 08/16/2021   Lab Results  Component Value Date   NA 139 10/31/2022   K 3.7 10/31/2022   CO2 19 (L) 10/31/2022   GLUCOSE 102 (H) 10/31/2022   BUN 11 10/31/2022   CREATININE 0.98 10/31/2022   CALCIUM 8.5 (L) 10/31/2022   GFR 84.97 08/16/2021   GFRNONAA >60 10/31/2022   Lab Results  Component Value Date   WBC 8.0 10/31/2022   HGB 11.2 (L) 10/31/2022   HCT 37.4 10/31/2022   MCV 84.4 10/31/2022   PLT 351 10/31/2022   Physical Exam:   VS:  BP 108/72 (BP Location: Left Arm, Patient Position: Sitting, Cuff Size: Large)   Pulse (!) 106   Resp 16   Ht 5\' 1"  (1.549 m)   Wt 207 lb 9.6 oz (94.2 kg)   SpO2 92%   BMI 39.23 kg/m    Wt Readings from Last 3 Encounters:  12/06/22 207 lb 9.6 oz (94.2 kg)  10/31/22 207 lb 3.7 oz (94 kg)  07/05/22 208 lb (94.3 kg)    Physical Exam Constitutional:      Appearance: She is obese.  Neck:     Vascular: No carotid bruit or JVD.  Cardiovascular:     Rate and Rhythm: Normal rate and regular rhythm.     Pulses: Intact distal pulses.     Heart sounds: Normal heart sounds. No murmur heard.    No gallop.  Pulmonary:     Effort: Pulmonary effort is normal.     Breath sounds: Normal breath sounds.  Abdominal:     General: Bowel sounds are normal.     Palpations: Abdomen is soft.  Musculoskeletal:     Right lower leg: No edema.     Left lower leg: No edema.    Studies Reviewed: Marland Kitchen    EKG:    EKG Interpretation Date/Time:  Wednesday December 06 2022 11:01:01 EDT Ventricular Rate:  90 PR Interval:  130 QRS Duration:  66 QT  Interval:  348 QTC Calculation: 425 R Axis:   60  Text Interpretation: EKG 12/05/2021: Normal sinus rhythm at the rate of 90 bpm, normal axis, no evidence of ischemia, normal EKG.  Compared to 11/30/2022, nonspecific inferior and lateral T wave abnormality not present. Confirmed by Delrae Rend [id number]) on 12/06/2022 11:04:08 AM    EMS run sheet 10/31/2022 SVT termination with adenosine    CXR single view 10/31/2022: The heart size and mediastinal contours are within normal limits. Both lungs are clear. The visualized skeletal structures are unremarkable  ASSESSMENT AND PLAN: .      ICD-10-CM   1. PSVT (paroxysmal supraventricular tachycardia) (HCC)  I47.10 EKG 12-Lead    ECHOCARDIOGRAM COMPLETE    Ambulatory referral to Cardiac Electrophysiology    2. Dyspnea on exertion  R06.09 MYOCARDIAL PERFUSION IMAGING    ECHOCARDIOGRAM COMPLETE    Cardiac Stress Test: Informed Consent Details: Physician/Practitioner Attestation; Transcribe to consent form and obtain patient signature    3. Controlled type 2 diabetes mellitus without complication, without long-term current use of insulin (HCC)  E11.9 MYOCARDIAL PERFUSION IMAGING    Cardiac Stress Test: Informed Consent Details: Physician/Practitioner Attestation; Transcribe to consent form and obtain patient signature    4. Hypercholesteremia  E78.00 atorvastatin (LIPITOR) 10 MG tablet    5. Nonspecific abnormal electrocardiogram (ECG) (EKG)  R94.31 MYOCARDIAL PERFUSION IMAGING    Cardiac Stress Test: Informed Consent Details: Physician/Practitioner Attestation; Transcribe to consent form and obtain patient signature      Assessment and Plan    Supraventricular Tachycardia (SVT) Recent episode of heart rate up to 250 bpm, with ongoing symptoms of intermittent tachycardia. Discussed the pathophysiology of SVT and the need for electrophysiology consultation for potential ablation procedure. -Refer to electrophysiology for SVT ablation  consultation. I am concerned about extreme elevation in HR around 270-290/min and associated CP and dyspnea.  -Order stress test and echocardiogram to further evaluate cardiac function. -Advise patient to take Metoprolol as needed during episodes of tachycardia and perform Valsalva maneuver.  Hyperlipidemia Elevated cholesterol levels, previously controlled with Atorvastatin but medication was discontinued. -Resume Atorvastatin and send prescription to [facility] in [place]. -Advise patient to limit intake of red meats and fried foods.  Weight Management & DM Patient has lost weight with Ozempic and desires further weight loss. -Encourage slow eating and pausing during meals to assess satiety. -Continue Ozempic and anticipate dose adjustment at upcoming primary care appointment.  Prior Tobacco Use History of half-pack per day smoking for 22 years, quit 2 years ago. -Encourage continued abstinence from smoking and vaping.   Signed,  Yates Decamp, MD, [facility] 12/06/2022, 7:04 PM [facility] [phone]  [address] [place], [place] [address] Phone: [phone]. Fax:  [phone]

## 2022-12-22 ENCOUNTER — Telehealth (HOSPITAL_COMMUNITY): Payer: Self-pay | Admitting: *Deleted

## 2022-12-22 NOTE — Telephone Encounter (Signed)
Spoke with patient and she was given detailed instructions about her STRESS TEST on 12/29/22 at 7:15.

## 2022-12-29 ENCOUNTER — Ambulatory Visit (HOSPITAL_BASED_OUTPATIENT_CLINIC_OR_DEPARTMENT_OTHER): Payer: PRIVATE HEALTH INSURANCE

## 2022-12-29 ENCOUNTER — Ambulatory Visit (HOSPITAL_COMMUNITY): Payer: PRIVATE HEALTH INSURANCE | Attending: Cardiology

## 2022-12-29 DIAGNOSIS — E119 Type 2 diabetes mellitus without complications: Secondary | ICD-10-CM

## 2022-12-29 DIAGNOSIS — I471 Supraventricular tachycardia, unspecified: Secondary | ICD-10-CM | POA: Diagnosis present

## 2022-12-29 DIAGNOSIS — R9431 Abnormal electrocardiogram [ECG] [EKG]: Secondary | ICD-10-CM

## 2022-12-29 DIAGNOSIS — R0609 Other forms of dyspnea: Secondary | ICD-10-CM | POA: Insufficient documentation

## 2022-12-29 LAB — MYOCARDIAL PERFUSION IMAGING
Base ST Depression (mm): 0 mm
Estimated workload: 6.1
Exercise duration (min): 4 min
Exercise duration (sec): 15 s
LV dias vol: 58 mL (ref 46–106)
LV sys vol: 25 mL
MPHR: 173 {beats}/min
Nuc Stress EF: 57 %
Peak HR: 162 {beats}/min
Percent HR: 93 %
Rest HR: 74 {beats}/min
Rest Nuclear Isotope Dose: 8.5 mCi
SDS: 2
SRS: 0
SSS: 2
ST Depression (mm): 0 mm
Stress Nuclear Isotope Dose: 25.5 mCi
TID: 1.04

## 2022-12-29 LAB — ECHOCARDIOGRAM COMPLETE
Area-P 1/2: 4.41 cm2
S' Lateral: 2.5 cm

## 2022-12-29 MED ORDER — TECHNETIUM TC 99M TETROFOSMIN IV KIT
8.5000 | PACK | Freq: Once | INTRAVENOUS | Status: AC | PRN
  Administered 2022-12-29: 8.5 via INTRAVENOUS

## 2022-12-29 MED ORDER — TECHNETIUM TC 99M TETROFOSMIN IV KIT
25.5000 | PACK | Freq: Once | INTRAVENOUS | Status: AC | PRN
  Administered 2022-12-29: 25.5 via INTRAVENOUS

## 2023-01-09 ENCOUNTER — Encounter: Payer: PRIVATE HEALTH INSURANCE | Admitting: Family Medicine

## 2023-01-10 ENCOUNTER — Ambulatory Visit (INDEPENDENT_AMBULATORY_CARE_PROVIDER_SITE_OTHER): Payer: PRIVATE HEALTH INSURANCE | Admitting: Family Medicine

## 2023-01-10 ENCOUNTER — Encounter: Payer: Self-pay | Admitting: Family Medicine

## 2023-01-10 VITALS — BP 110/62 | HR 90 | Temp 99.0°F | Ht 61.5 in | Wt 206.8 lb

## 2023-01-10 DIAGNOSIS — Z0001 Encounter for general adult medical examination with abnormal findings: Secondary | ICD-10-CM

## 2023-01-10 DIAGNOSIS — F320 Major depressive disorder, single episode, mild: Secondary | ICD-10-CM | POA: Diagnosis not present

## 2023-01-10 DIAGNOSIS — E559 Vitamin D deficiency, unspecified: Secondary | ICD-10-CM

## 2023-01-10 DIAGNOSIS — Z0000 Encounter for general adult medical examination without abnormal findings: Secondary | ICD-10-CM

## 2023-01-10 DIAGNOSIS — E66812 Obesity, class 2: Secondary | ICD-10-CM | POA: Diagnosis not present

## 2023-01-10 DIAGNOSIS — I471 Supraventricular tachycardia, unspecified: Secondary | ICD-10-CM

## 2023-01-10 DIAGNOSIS — F411 Generalized anxiety disorder: Secondary | ICD-10-CM

## 2023-01-10 DIAGNOSIS — E785 Hyperlipidemia, unspecified: Secondary | ICD-10-CM

## 2023-01-10 DIAGNOSIS — E1169 Type 2 diabetes mellitus with other specified complication: Secondary | ICD-10-CM

## 2023-01-10 DIAGNOSIS — Z6839 Body mass index (BMI) 39.0-39.9, adult: Secondary | ICD-10-CM

## 2023-01-10 DIAGNOSIS — E119 Type 2 diabetes mellitus without complications: Secondary | ICD-10-CM

## 2023-01-10 DIAGNOSIS — Z7985 Long-term (current) use of injectable non-insulin antidiabetic drugs: Secondary | ICD-10-CM

## 2023-01-10 DIAGNOSIS — J4540 Moderate persistent asthma, uncomplicated: Secondary | ICD-10-CM

## 2023-01-10 LAB — COMPREHENSIVE METABOLIC PANEL
ALT: 13 U/L (ref 0–35)
AST: 16 U/L (ref 0–37)
Albumin: 4.2 g/dL (ref 3.5–5.2)
Alkaline Phosphatase: 47 U/L (ref 39–117)
BUN: 12 mg/dL (ref 6–23)
CO2: 27 meq/L (ref 19–32)
Calcium: 9.1 mg/dL (ref 8.4–10.5)
Chloride: 106 meq/L (ref 96–112)
Creatinine, Ser: 0.76 mg/dL (ref 0.40–1.20)
GFR: 93.52 mL/min (ref >60.00)
Glucose, Bld: 102 mg/dL — ABNORMAL HIGH (ref 70–99)
Potassium: 4.1 meq/L (ref 3.5–5.1)
Sodium: 135 meq/L (ref 135–145)
Total Bilirubin: 0.2 mg/dL (ref 0.2–1.2)
Total Protein: 6.8 g/dL (ref 6.0–8.3)

## 2023-01-10 LAB — MICROALBUMIN / CREATININE URINE RATIO
Creatinine,U: 157.9 mg/dL
Microalb Creat Ratio: 0.5 mg/g (ref 0.0–30.0)
Microalb, Ur: 0.8 mg/dL (ref 0.0–1.9)

## 2023-01-10 LAB — LIPID PANEL
Cholesterol: 147 mg/dL (ref 0–200)
HDL: 32 mg/dL — ABNORMAL LOW (ref >39.00)
LDL Cholesterol: 56 mg/dL (ref 0–99)
NonHDL: 114.61
Total CHOL/HDL Ratio: 5
Triglycerides: 291 mg/dL — ABNORMAL HIGH (ref 0.0–149.0)
VLDL: 58.2 mg/dL — ABNORMAL HIGH (ref 0.0–40.0)

## 2023-01-10 LAB — HM DIABETES FOOT EXAM

## 2023-01-10 LAB — HEMOGLOBIN A1C: Hgb A1c MFr Bld: 6.4 % (ref 4.6–6.5)

## 2023-01-10 LAB — VITAMIN D 25 HYDROXY (VIT D DEFICIENCY, FRACTURES): VITD: 12.5 ng/mL — ABNORMAL LOW (ref 30.00–100.00)

## 2023-01-10 MED ORDER — WIXELA INHUB 100-50 MCG/ACT IN AEPB: 1.0000 | INHALATION_SPRAY | Freq: Two times a day (BID) | RESPIRATORY_TRACT | 11 refills | Status: AC

## 2023-01-10 MED ORDER — SEMAGLUTIDE (1 MG/DOSE) 4 MG/3ML ~~LOC~~ SOPN: 1.0000 mg | PEN_INJECTOR | SUBCUTANEOUS | 3 refills | Status: AC

## 2023-01-10 NOTE — Patient Instructions (Addendum)
Please stop at the lab to have labs drawn.  Increase semaglutide to 1 mg weekly. Get back  to regular exercise.

## 2023-01-10 NOTE — Assessment & Plan Note (Signed)
Followed by cardiology Dr. Jacinto Halim.  Reviewed December 06, 2022 note ECHO EF 60-65% trivial mitral valve regurg.  Cardiac Stress test:  low risk Recommended: Advise patient to take Metoprolol as needed during episodes of tachycardia and perform Valsalva maneuver.

## 2023-01-10 NOTE — Assessment & Plan Note (Addendum)
Chronic.  Due for reevaluation   Will continue current dose of Ozempic and last A1c above goal. Ozempic 0.5 mg weekly.  Follow-up in 6 months

## 2023-01-10 NOTE — Assessment & Plan Note (Signed)
Stable, chronic.  Continue current medication.  Venlafaxine XR 37.5 mg p.o. daily

## 2023-01-10 NOTE — Assessment & Plan Note (Addendum)
Inadequate daily control, chronic. On ICS.   Change to ICS/LABA fluticasone/ sameterol for better control.   HAs duo nebs and ALbuterol for rescue

## 2023-01-10 NOTE — Progress Notes (Signed)
Patient ID: Robyn Barker, female    DOB: Oct 04, 1975, 47 y.o.   MRN: [id number]  This visit was conducted in person.  BP 110/62   Pulse 90   Temp 99 F (37.2 C) (Oral)   Ht 5' 1.5" (1.562 m)   Wt 206 lb 12.8 oz (93.8 kg)   LMP 12/19/2022   SpO2 97%   BMI 38.44 kg/m    CC:  Chief Complaint  Patient presents with   Annual Exam    Subjective:   HPI: Robyn Barker is a 47 y.o. female presenting on 01/10/2023 for Annual Exam  Patient seen at ED on October 31, 2022 for episode of SVT, heart rate up to 220 and blood pressure 80/40.  She was started on metoprolol tartrate 12.5 mg as needed Screening CBC metabolic panel negative UA negative Chest x-ray unremarkable EKG showing SVT Referred to cardiology saw Dr. Jacinto Halim on December 06, 2022  ECHO EF 60-65% trivial mitral valve regurg.  Cardiac Stress test:  low risk    Diabetes:  Due for re-eval. Lab Results  Component Value Date   HGBA1C 6.1 (A) 07/05/2022  Using medications without difficulties: Hypoglycemic episodes: none Hyperglycemic episodes: none Feet problems: no ulcers Blood Sugars averaging: not checking, raely at work eye exam within last year:  yes.Marland Kitchen scheduled  Elevated Cholesterol: Due for re-eval Using medications without problems: Muscle aches:  Diet compliance: moderate Exercise: minimal recently Other complaints:   Obesity Body mass index is 38.44 kg/m.   Wt Readings from Last 3 Encounters:  01/10/23 206 lb 12.8 oz (93.8 kg)  12/06/22 207 lb 9.6 oz (94.2 kg)  10/31/22 207 lb 3.7 oz (94 kg)    Moderate Persistent Asthma: Started budesonide back in last month.  Plans to restart zyrtec on daily basis.     Relevant past medical, surgical, family and social history reviewed and updated as indicated. Interim medical history since our last visit reviewed. Allergies and medications reviewed and updated. Outpatient Medications Prior to Visit  Medication Sig Dispense Refill   albuterol  (VENTOLIN HFA) 108 (90 Base) MCG/ACT inhaler INHALE 2 PUFFS BY MOUTH EVERY 6 HOURS AS NEEDED FOR WHEEZE OR SHORTNESS OF BREATH 8.5 each 2   atorvastatin (LIPITOR) 10 MG tablet Take 1 tablet (10 mg total) by mouth daily. 90 tablet 0   clonazePAM (KLONOPIN) 0.5 MG tablet Take 1 tablet (0.5 mg total) by mouth 2 (two) times daily as needed for anxiety. 15 tablet 0   hydrOXYzine (ATARAX) 10 MG tablet TAKE 1 TABLET BY MOUTH DAILY AS NEEDED FOR ANXIETY. 30 tablet 0   ibuprofen (ADVIL) 800 MG tablet TAKE 1 TABLET BY MOUTH EVERY 8 HOURS AS NEEDED FOR PAIN 30 tablet 3   ipratropium-albuterol (DUONEB) 0.5-2.5 (3) MG/3ML SOLN Inhale 3 mLs into the lungs every 4 (four) hours as needed. 360 mL 0   metoprolol tartrate (LOPRESSOR) 25 MG tablet Take 0.5 tablets (12.5 mg total) by mouth as needed for up to 10 doses. 10 tablet 0   venlafaxine XR (EFFEXOR XR) 37.5 MG 24 hr capsule Take 1 capsule (37.5 mg total) by mouth daily with breakfast. 30 capsule 3   Budesonide 90 MCG/ACT inhaler 1 inhalation twice daily 1 each 11   Semaglutide,0.25 or 0.5MG /DOS, (OZEMPIC, 0.25 OR 0.5 MG/DOSE,) 2 MG/3ML SOPN Inject 0.5 mg into the skin once a week. 3 mL 11   albuterol (PROVENTIL) (2.5 MG/3ML) 0.083% nebulizer solution Inhale into the lungs.     No facility-administered medications  prior to visit.     Per HPI unless specifically indicated in ROS section below Review of Systems  Constitutional:  Negative for fatigue and fever.  HENT:  Negative for congestion.   Eyes:  Negative for pain.  Respiratory:  Negative for cough and shortness of breath.   Cardiovascular:  Negative for chest pain, palpitations and leg swelling.  Gastrointestinal:  Negative for abdominal pain.  Genitourinary:  Negative for dysuria and vaginal bleeding.  Musculoskeletal:  Negative for back pain.  Neurological:  Negative for syncope, light-headedness and headaches.  Psychiatric/Behavioral:  Negative for dysphoric mood.    Objective:  BP 110/62    Pulse 90   Temp 99 F (37.2 C) (Oral)   Ht 5' 1.5" (1.562 m)   Wt 206 lb 12.8 oz (93.8 kg)   LMP 12/19/2022   SpO2 97%   BMI 38.44 kg/m   Wt Readings from Last 3 Encounters:  01/10/23 206 lb 12.8 oz (93.8 kg)  12/06/22 207 lb 9.6 oz (94.2 kg)  10/31/22 207 lb 3.7 oz (94 kg)      Physical Exam Constitutional:      General: She is not in acute distress.    Appearance: Normal appearance. She is well-developed. She is not ill-appearing or toxic-appearing.  HENT:     Head: Normocephalic.     Right Ear: Hearing, tympanic membrane, ear canal and external ear normal. There is impacted cerumen. Tympanic membrane is not erythematous, retracted or bulging.     Left Ear: Hearing, tympanic membrane, ear canal and external ear normal. There is impacted cerumen. Tympanic membrane is not erythematous, retracted or bulging.     Ears:     Comments: TMs clear following cerumen impaction removal    Nose: No mucosal edema or rhinorrhea.     Right Sinus: No maxillary sinus tenderness or frontal sinus tenderness.     Left Sinus: No maxillary sinus tenderness or frontal sinus tenderness.     Mouth/Throat:     Pharynx: Uvula midline.  Eyes:     General: Lids are normal. Lids are everted, no foreign bodies appreciated.     Conjunctiva/sclera: Conjunctivae normal.     Pupils: Pupils are equal, round, and reactive to light.  Neck:     Thyroid: No thyroid mass or thyromegaly.     Vascular: No carotid bruit.     Trachea: Trachea normal.  Cardiovascular:     Rate and Rhythm: Normal rate and regular rhythm.     Pulses: Normal pulses.     Heart sounds: Normal heart sounds, S1 normal and S2 normal. No murmur heard.    No friction rub. No gallop.  Pulmonary:     Effort: Pulmonary effort is normal. No tachypnea or respiratory distress.     Breath sounds: Wheezing present. No decreased breath sounds, rhonchi or rales.     Comments:  Scattered wheeze Abdominal:     General: Bowel sounds are normal.      Palpations: Abdomen is soft.     Tenderness: There is no abdominal tenderness.  Musculoskeletal:     Cervical back: Normal range of motion and neck supple.  Skin:    General: Skin is warm and dry.     Findings: No rash.  Neurological:     Mental Status: She is alert.  Psychiatric:        Mood and Affect: Mood is not anxious or depressed.        Speech: Speech normal.  Behavior: Behavior normal. Behavior is cooperative.        Thought Content: Thought content normal.        Judgment: Judgment normal.       Results for orders placed or performed in visit on 01/10/23  HM DIABETES FOOT EXAM  Result Value Ref Range   HM Diabetic Foot Exam done      COVID 19 screen:  No recent travel or known exposure to COVID19 The patient denies respiratory symptoms of COVID 19 at this time. The importance of social distancing was discussed today.   Assessment and Plan   The patient's preventative maintenance and recommended screening tests for an annual wellness exam were reviewed in full today. Brought up to date unless services declined.  Counselled on the importance of diet, exercise, and its role in overall health and mortality. The patient's FH and SH was reviewed, including their home life, tobacco status, and drug and alcohol status.   Vaccines: Discussed COVID, refused flu.Dannielle Huh Uptodate Pap/DVE: 03/2022 per GYN nml .Marland Kitchen Repeat in 5 years Mammo:  02/2022 Bone Density: not indicated Colon:  10/2021  repeat in 10 years Smoking Status:  nonsmoker.Marland Kitchen occ vaping ETOH/ drug use: none.none  Urine microglobulin pending  Problem List Items Addressed This Visit     Class 2 drug-induced obesity with serious comorbidity and body mass index (BMI) of 39.0 to 39.9 in adult    Encouraged exercise, weight loss, healthy eating habits.       Relevant Medications   Semaglutide, 1 MG/DOSE, 4 MG/3ML SOPN   Controlled type 2 diabetes mellitus without complication, without long-term current use of  insulin (HCC)    Chronic.  Due for reevaluation   Will continue current dose of Ozempic and last A1c above goal. Ozempic 0.5 mg weekly.  Follow-up in 6 months      Relevant Medications   Semaglutide, 1 MG/DOSE, 4 MG/3ML SOPN   Other Relevant Orders   Hemoglobin A1c   Comprehensive metabolic panel   Microalbumin / creatinine urine ratio   GAD (generalized anxiety disorder)   Hyperlipidemia associated with type 2 diabetes mellitus (HCC)    Chronic On atorvastatin 10 mg p.o. daily  Due for reevaluation      Relevant Medications   Semaglutide, 1 MG/DOSE, 4 MG/3ML SOPN   Other Relevant Orders   Lipid panel   MDD (major depressive disorder), single episode, mild (HCC)    Stable, chronic.  Continue current medication.  Venlafaxine XR 37.5 mg p.o. daily      Moderate persistent asthma    Inadequate daily control, chronic. On ICS.   Change to ICS/LABA fluticasone/ sameterol for better control.   HAs duo nebs and ALbuterol for rescue      Relevant Medications   fluticasone-salmeterol (WIXELA INHUB) 100-50 MCG/ACT AEPB   PSVT (paroxysmal supraventricular tachycardia) (HCC)     Followed by cardiology Dr. Jacinto Halim.  Reviewed December 06, 2022 note ECHO EF 60-65% trivial mitral valve regurg.  Cardiac Stress test:  low risk Recommended: Advise patient to take Metoprolol as needed during episodes of tachycardia and perform Valsalva maneuver.       Vitamin D deficiency    Due for reevaluation      Relevant Orders   VITAMIN D 25 Hydroxy (Vit-D Deficiency, Fractures)   Other Visit Diagnoses     Routine general medical examination at a health care facility    -  Primary       Meds ordered this encounter  Medications  Semaglutide, 1 MG/DOSE, 4 MG/3ML SOPN    Sig: Inject 1 mg as directed once a week.    Dispense:  9 mL    Refill:  3   fluticasone-salmeterol (WIXELA INHUB) 100-50 MCG/ACT AEPB    Sig: Inhale 1 puff into the lungs 2 (two) times daily.    Dispense:  60 each     Refill:  11   Orders Placed This Encounter  Procedures   Hemoglobin A1c    Standing Status:   Future    Standing Expiration Date:   04/10/2023   Lipid panel    Standing Status:   Future    Standing Expiration Date:   04/10/2023   Comprehensive metabolic panel    Standing Status:   Future    Standing Expiration Date:   04/10/2023   Microalbumin / creatinine urine ratio    Standing Status:   Future    Standing Expiration Date:   01/10/2024   VITAMIN D 25 Hydroxy (Vit-D Deficiency, Fractures)    Standing Status:   Future    Standing Expiration Date:   01/10/2024   HM DIABETES FOOT EXAM    This external order was created through the Results Console.      Kerby Nora, MD

## 2023-01-10 NOTE — Assessment & Plan Note (Signed)
Due for reevaluation 

## 2023-01-10 NOTE — Assessment & Plan Note (Addendum)
Chronic On atorvastatin 10 mg p.o. daily  Due for reevaluation

## 2023-01-10 NOTE — Assessment & Plan Note (Signed)
Encouraged exercise, weight loss, healthy eating habits. ? ?

## 2023-01-15 ENCOUNTER — Other Ambulatory Visit: Payer: Self-pay | Admitting: Family Medicine

## 2023-01-15 MED ORDER — VITAMIN D3 1.25 MG (50000 UT) PO CAPS: 1.0000 | ORAL_CAPSULE | ORAL | 0 refills | Status: DC

## 2023-01-21 NOTE — Progress Notes (Unsigned)
  Electrophysiology Office Note:   Date:  01/21/2023  ID:  Robyn Barker, DOB [birth year], MRN [id number]  Primary Cardiologist: Yates Decamp, MD Electrophysiologist: Nobie Putnam, MD  {Click to update primary MD,subspecialty MD or APP then REFRESH:1}    History of Present Illness:   Robyn Barker is a 47 y.o. female with h/o former smoker, hyperlipidemia, type II diabetes who is being seen today for evaluation of SVT at the request of Dr. Jacinto Halim.  On 10/31/22, patient had an episode of palpitations, which was accompanied by chest pain and shortness of breath. She attempted to alleviate the symptoms with Tums and aspirin, but these measures were unsuccessful. EMS was called. She was found to be hypotensive. Adenosine 6mg  IV was given for a heart rate of 220bpm.   Review of systems complete and found to be negative unless listed in HPI.   EP Information / Studies Reviewed:    EKG is not ordered today. EKG from 12/06/22 reviewed which showed normal sinus rhythm.      EMS EKG 10/31/22:   Echo 12/29/22:  IMPRESSIONS  1. Left ventricular ejection fraction, by estimation, is 60 to 65%. The  left ventricle has normal function. The left ventricle has no regional  wall motion abnormalities. Left ventricular diastolic parameters were  normal. The average left ventricular global longitudinal strain is -19.9 %. The global longitudinal strain is  normal.   2. Right ventricular systolic function is normal. The right ventricular  size is normal.   3. The mitral valve is abnormal. Trivial mitral valve regurgitation. No  evidence of mitral stenosis.   4. The aortic valve is tricuspid. Aortic valve regurgitation is not  visualized. No aortic stenosis is present.   5. The inferior vena cava is normal in size with greater than 50%  respiratory variability, suggesting right atrial pressure of 3 mmHg.   Nuclear Stress 12/29/22:   The study is normal. The study is low risk.   No ST deviation was  noted.   LV perfusion is normal. There is no evidence of ischemia. There is no evidence of infarction.   Left ventricular function is normal. Nuclear stress EF: 57%. The left ventricular ejection fraction is normal (55-65%). End diastolic cavity size is normal. End systolic cavity size is normal.   Prior study not available for comparison.   Risk Assessment/Calculations:     No BP recorded.  {Refresh Note OR Click here to enter BP  :1}***        Physical Exam:   VS:  LMP 12/19/2022    Wt Readings from Last 3 Encounters:  01/10/23 206 lb 12.8 oz (93.8 kg)  12/06/22 207 lb 9.6 oz (94.2 kg)  10/31/22 207 lb 3.7 oz (94 kg)     GEN: Well nourished, well developed in no acute distress NECK: No JVD CARDIAC: {EPRHYTHM:28826}, no murmurs, rubs, gallops RESPIRATORY:  Clear to auscultation without rales, wheezing or rhonchi  ABDOMEN: Soft, non-tender, non-distended EXTREMITIES:  No edema; No deformity   ASSESSMENT AND PLAN:   Robyn Barker is a 47 y.o. female with h/o former smoker, hyperlipidemia, type II diabetes who is being seen today for evaluation of SVT at the request of Dr. Jacinto Halim.  #Symptomatic SVT:  #Hypertension *** goal today.  Recommend checking blood pressures 1-2 times per week at home and recording the values.  Recommend bringing these recordings to the primary care physician.  Follow up with {WUJWJ:19147} {EPFOLLOW WG:95621}  Signed, Nobie Putnam, MD

## 2023-01-22 ENCOUNTER — Ambulatory Visit: Payer: PRIVATE HEALTH INSURANCE | Admitting: Cardiology

## 2023-02-15 ENCOUNTER — Other Ambulatory Visit: Payer: Self-pay | Admitting: Family Medicine

## 2023-02-15 DIAGNOSIS — Z1231 Encounter for screening mammogram for malignant neoplasm of breast: Secondary | ICD-10-CM

## 2023-03-01 NOTE — Progress Notes (Deleted)
  Electrophysiology Office Note:   Date:  03/01/2023  ID:  Robyn Barker, DOB January 30, 1976, MRN [id number]  Primary Cardiologist: Gordy Bergamo, MD Primary Heart Failure: None Electrophysiologist: Fonda Kitty, MD  {Click to update primary MD,subspecialty MD or APP then REFRESH:1}    History of Present Illness:   Robyn Barker is a 48 y.o. female with h/o type II diabetes, asthma, hyperlipidemia, and former smoker who is being seen today for evaluation of SVT at the request of Dr. Bergamo.  Patient has had palpitations for over a year, usually related to caffeine  consumption.  On October 31, 2022 patient was brought to the ED by EMS for palpitations after being found to have a heart rate of 250 beats a minute.  Symptoms were associated with chest pain and shortness of breath.  SVT terminated with adenosine.  He was given metoprolol  as needed for episodes of tachycardia.  Review of systems complete and found to be negative unless listed in HPI.   EP Information / Studies Reviewed:    EKG is ordered today. Personal review as below.      EMS EKG 10/31/22:    Nuclear stress 12/29/22:    The study is normal. The study is low risk.   No ST deviation was noted.   LV perfusion is normal. There is no evidence of ischemia. There is no evidence of infarction.   Left ventricular function is normal. Nuclear stress EF: 57%. The left ventricular ejection fraction is normal (55-65%). End diastolic cavity size is normal. End systolic cavity size is normal.   Prior study not available for comparison.  Echo 12/29/22:   1. Left ventricular ejection fraction, by estimation, is 60 to 65%. The  left ventricle has normal function. The left ventricle has no regional  wall motion abnormalities. Left ventricular diastolic parameters were  normal. The average left ventricular  global longitudinal strain is -19.9 %. The global longitudinal strain is  normal.   2. Right ventricular systolic function is normal.  The right ventricular  size is normal.   3. The mitral valve is abnormal. Trivial mitral valve regurgitation. No  evidence of mitral stenosis.   4. The aortic valve is tricuspid. Aortic valve regurgitation is not  visualized. No aortic stenosis is present.   5. The inferior vena cava is normal in size with greater than 50%  respiratory variability, suggesting right atrial pressure of 3 mmHg.       Physical Exam:   VS:  There were no vitals taken for this visit.   Wt Readings from Last 3 Encounters:  01/10/23 206 lb 12.8 oz (93.8 kg)  12/06/22 207 lb 9.6 oz (94.2 kg)  10/31/22 207 lb 3.7 oz (94 kg)     GEN: Well nourished, well developed in no acute distress NECK: No JVD; No carotid bruits CARDIAC: {EPRHYTHM:28826}, no murmurs, rubs, gallops RESPIRATORY:  Clear to auscultation without rales, wheezing or rhonchi  ABDOMEN: Soft, non-tender, non-distended EXTREMITIES:  No edema; No deformity   ASSESSMENT AND PLAN:   Robyn Barker is a 48 y.o. female with h/o type II diabetes, asthma, hyperlipidemia, and former smoker who is being seen today for evaluation of SVT at the request of Dr. Bergamo.  #SVT:  Follow up with {ZEFID:71864} {EPFOLLOW LE:71826}  Signed, Fonda Kitty, MD

## 2023-03-02 ENCOUNTER — Ambulatory Visit: Payer: PRIVATE HEALTH INSURANCE | Admitting: Cardiology

## 2023-03-05 ENCOUNTER — Encounter: Payer: Self-pay | Admitting: Cardiology

## 2023-03-07 LAB — HM DIABETES EYE EXAM

## 2023-03-15 ENCOUNTER — Encounter: Payer: Self-pay | Admitting: Family Medicine

## 2023-03-15 ENCOUNTER — Ambulatory Visit
Admission: RE | Admit: 2023-03-15 | Discharge: 2023-03-15 | Disposition: A | Discharge status: Home / Self Care | Payer: BC Managed Care – PPO | Source: Ambulatory Visit | Attending: Family Medicine | Admitting: Family Medicine

## 2023-03-15 DIAGNOSIS — Z1231 Encounter for screening mammogram for malignant neoplasm of breast: Secondary | ICD-10-CM | POA: Insufficient documentation

## 2023-04-10 ENCOUNTER — Other Ambulatory Visit: Payer: Self-pay | Admitting: Family Medicine

## 2023-04-10 NOTE — Telephone Encounter (Signed)
Last office visit 01/10/2023 for CPE.  Last refilled 01/15/2023 for #12 with no refills.  Vit D 01/10/2023 low at 12.50.  Next Appt: No future appointments.

## 2023-05-31 ENCOUNTER — Other Ambulatory Visit: Payer: Self-pay

## 2023-05-31 ENCOUNTER — Encounter (HOSPITAL_COMMUNITY): Payer: Self-pay | Admitting: Emergency Medicine

## 2023-05-31 ENCOUNTER — Emergency Department (HOSPITAL_COMMUNITY): Payer: PRIVATE HEALTH INSURANCE

## 2023-05-31 ENCOUNTER — Emergency Department (HOSPITAL_COMMUNITY)
Admission: EM | Admit: 2023-05-31 | Discharge: 2023-05-31 | Disposition: A | Discharge status: Home / Self Care | Payer: PRIVATE HEALTH INSURANCE | Attending: Emergency Medicine | Admitting: Emergency Medicine

## 2023-05-31 DIAGNOSIS — Z7951 Long term (current) use of inhaled steroids: Secondary | ICD-10-CM | POA: Diagnosis not present

## 2023-05-31 DIAGNOSIS — R002 Palpitations: Secondary | ICD-10-CM | POA: Diagnosis present

## 2023-05-31 DIAGNOSIS — Z794 Long term (current) use of insulin: Secondary | ICD-10-CM | POA: Diagnosis not present

## 2023-05-31 DIAGNOSIS — I471 Supraventricular tachycardia, unspecified: Secondary | ICD-10-CM | POA: Diagnosis not present

## 2023-05-31 DIAGNOSIS — J45909 Unspecified asthma, uncomplicated: Secondary | ICD-10-CM | POA: Insufficient documentation

## 2023-05-31 LAB — BASIC METABOLIC PANEL WITH GFR
Anion gap: 9 (ref 5–15)
BUN: 11 mg/dL (ref 6–20)
CO2: 24 mmol/L (ref 22–32)
Calcium: 9 mg/dL (ref 8.9–10.3)
Chloride: 105 mmol/L (ref 98–111)
Creatinine, Ser: 0.89 mg/dL (ref 0.44–1.00)
GFR, Estimated: >60 mL/min (ref >60)
Glucose, Bld: 82 mg/dL (ref 70–99)
Potassium: 3.6 mmol/L (ref 3.5–5.1)
Sodium: 138 mmol/L (ref 135–145)

## 2023-05-31 LAB — CBC
HCT: 36.2 % (ref 36.0–46.0)
Hemoglobin: 10.9 g/dL — ABNORMAL LOW (ref 12.0–15.0)
MCH: 24.1 pg — ABNORMAL LOW (ref 26.0–34.0)
MCHC: 30.1 g/dL (ref 30.0–36.0)
MCV: 79.9 fL — ABNORMAL LOW (ref 80.0–100.0)
Platelets: 356 10*3/uL (ref 150–400)
RBC: 4.53 MIL/uL (ref 3.87–5.11)
RDW: 16 % — ABNORMAL HIGH (ref 11.5–15.5)
WBC: 9.7 10*3/uL (ref 4.0–10.5)
nRBC: 0 % (ref 0.0–0.2)

## 2023-05-31 LAB — HCG, SERUM, QUALITATIVE: Preg, Serum: NEGATIVE

## 2023-05-31 LAB — TROPONIN I (HIGH SENSITIVITY): Troponin I (High Sensitivity): 12 ng/L (ref <18)

## 2023-05-31 MED ORDER — LACTATED RINGERS IV BOLUS
1000.0000 mL | Freq: Once | INTRAVENOUS | Status: AC
  Administered 2023-05-31: 1000 mL via INTRAVENOUS

## 2023-05-31 NOTE — ED Provider Notes (Signed)
 [facility] EMERGENCY DEPARTMENT AT [facility] Provider Note   CSN: [id number] Arrival date & time: 05/31/23  1815     History  Chief Complaint  Patient presents with   Palpitations    Robyn Barker is a 48 y.o. female.  Pt is a 48 yo with pmhx significant for hld, asthma, SVT, anxiety and depression.  Pt said she had SVT in September.  She did f/u with cards (Dr. Berry Bristol) and he made a referral to EP.  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  Pt never made a new appt.  Pt had an episode of SVT this evening.  EMS was called and gave her 6 mg adenosine which broke her SVT.  Pt feels better now.       Home Medications Prior to Admission medications   Medication Sig Start Date End Date Taking? Authorizing Provider  albuterol  (PROVENTIL ) (2.5 MG/3ML) 0.083% nebulizer solution Inhale into the lungs. 12/02/21 06/05/23  [provider]  albuterol  (VENTOLIN  HFA) 108 (90 Base) MCG/ACT inhaler INHALE 2 PUFFS BY MOUTH EVERY 6 HOURS AS NEEDED FOR WHEEZE OR SHORTNESS OF BREATH 03/28/22   Bedsole, Amy E, MD  atorvastatin  (LIPITOR) 10 MG tablet Take 1 tablet (10 mg total) by mouth daily. 12/06/22 06/05/23  Knox Perl, MD  Cholecalciferol (VITAMIN D3) 1.25 MG (50000 UT) CAPS TAKE 1 CAPSULE BY MOUTH ONE TIME PER WEEK 04/10/23   Bedsole, Amy E, MD  clonazePAM  (KLONOPIN ) 0.5 MG tablet Take 1 tablet (0.5 mg total) by mouth 2 (two) times daily as needed for anxiety. 12/06/21   Bedsole, Amy E, MD  fluticasone-salmeterol (WIXELA INHUB ) 100-50 MCG/ACT AEPB Inhale 1 puff into the lungs 2 (two) times daily. 01/10/23   Bedsole, Amy E, MD  hydrOXYzine  (ATARAX ) 10 MG tablet TAKE 1 TABLET BY MOUTH DAILY AS NEEDED FOR ANXIETY. 08/15/21   Bedsole, Amy E, MD  ibuprofen  (ADVIL ) 800 MG tablet TAKE 1 TABLET BY MOUTH EVERY 8 HOURS AS NEEDED FOR PAIN 04/25/22   Bedsole, Amy E, MD  ipratropium-albuterol  (DUONEB) 0.5-2.5 (3) MG/3ML SOLN  Inhale 3 mLs into the lungs every 4 (four) hours as needed. 03/26/22 06/05/23  Arnie Bibber, MD  metoprolol  tartrate (LOPRESSOR ) 25 MG tablet Take 1 tablet (25 mg total) by mouth as needed. 06/05/23   Ardeen Kohler, MD  Semaglutide , 1 MG/DOSE, 4 MG/3ML SOPN Inject 1 mg as directed once a week. 01/10/23   Judithann Novas, MD  venlafaxine  XR (EFFEXOR  XR) 37.5 MG 24 hr capsule Take 1 capsule (37.5 mg total) by mouth daily with breakfast. 07/15/21   Cherlyn Cornet, Amy E, MD      Allergies    No known allergies    Review of Systems   Review of Systems  Cardiovascular:  Positive for palpitations.  All other systems reviewed and are negative.   Physical Exam Updated Vital Signs BP 115/84 (BP Location: Right Arm)   Pulse 95   Temp 97.6 F (36.4 C) (Oral)   Resp 19   SpO2 100%  Physical Exam Vitals and nursing note reviewed.  Constitutional:      Appearance: Normal appearance.  HENT:     Head: Normocephalic and atraumatic.     Right Ear: External ear normal.     Left Ear: External ear normal.     Nose: Nose normal.     Mouth/Throat:     Mouth: Mucous membranes are  moist.     Pharynx: Oropharynx is clear.  Eyes:     Extraocular Movements: Extraocular movements intact.     Conjunctiva/sclera: Conjunctivae normal.     Pupils: Pupils are equal, round, and reactive to light.  Cardiovascular:     Rate and Rhythm: Regular rhythm. Tachycardia present.     Pulses: Normal pulses.     Heart sounds: Normal heart sounds.  Pulmonary:     Effort: Pulmonary effort is normal.     Breath sounds: Normal breath sounds.  Abdominal:     General: Abdomen is flat. Bowel sounds are normal.     Palpations: Abdomen is soft.  Musculoskeletal:        General: Normal range of motion.     Cervical back: Normal range of motion and neck supple.  Skin:    General: Skin is warm.     Capillary Refill: Capillary refill takes less than 2 seconds.  Neurological:     General: No focal deficit present.     Mental  Status: She is alert and oriented to person, place, and time.  Psychiatric:        Mood and Affect: Mood normal.        Behavior: Behavior normal.     ED Results / Procedures / Treatments   Labs (all labs ordered are listed, but only abnormal results are displayed) Labs Reviewed  CBC - Abnormal; Notable for the following components:      Result Value   Hemoglobin 10.9 (*)    MCV 79.9 (*)    MCH 24.1 (*)    RDW 16.0 (*)    All other components within normal limits  BASIC METABOLIC PANEL WITH GFR  HCG, SERUM, QUALITATIVE  TROPONIN I (HIGH SENSITIVITY)    EKG EKG Interpretation Date/Time:  Thursday May 31 2023 18:20:54 EDT Ventricular Rate:  107 PR Interval:  136 QRS Duration:  70 QT Interval:  328 QTC Calculation: 437 R Axis:   71  Text Interpretation: Sinus tachycardia Nonspecific T wave abnormality Abnormal ECG When compared with ECG of 06-Dec-2022 11:01, PREVIOUS ECG IS PRESENT Confirmed by Abner Hoffman [phone]) on 05/31/2023 6:52:16 PM  Radiology No results found.  Procedures Procedures    Medications Ordered in ED Medications  lactated ringers  bolus 1,000 mL (0 mLs Intravenous Stopped 05/31/23 2102)    ED Course/ Medical Decision Making/ A&P                                 Medical Decision Making  This patient presents to the ED for concern of palpitations, this involves an extensive number of treatment options, and is a complaint that carries with it a high risk of complications and morbidity.  The differential diagnosis includes svt, electrolyte abn, anemia   Co morbidities that complicate the patient evaluation   hld, asthma, SVT, anxiety and depression   Additional history obtained:  Additional history obtained from epic chart review External records from outside source obtained and reviewed including EMS report   Lab Tests:  I Ordered, and personally interpreted labs.  The pertinent results include:  cbc with hgb sl low at 10.9, preg neg, bmp  nl, trop nl   Imaging Studies ordered:  I ordered imaging studies including cxr  I independently visualized and interpreted imaging which showed No active cardiopulmonary disease.  I agree with the radiologist interpretation   Cardiac Monitoring:  The patient was maintained on a  cardiac monitor.  I personally viewed and interpreted the cardiac monitored which showed an underlying rhythm of: st   Medicines ordered and prescription drug management:   I have reviewed the patients home medicines and have made adjustments as needed   Critical Interventions:  adenosine   Problem List / ED Course:  SVT:  EMS gave adenosine which broke the SVT.  Pt's HR is now in nsr.  She is stable for d/c.  Return if worse.  F/u with EP.   Reevaluation:  After the interventions noted above, I reevaluated the patient and found that they have :improved   Social Determinants of Health:  Lives at home   Dispostion:  After consideration of the diagnostic results and the patients response to treatment, I feel that the patent would benefit from discharge with outpatient f/u.          Final Clinical Impression(s) / ED Diagnoses Final diagnoses:  SVT (supraventricular tachycardia) [facility])    Rx / DC Orders ED Discharge Orders          Ordered    Ambulatory referral to Cardiac Electrophysiology        05/31/23 2042              Sueellen Emery, MD 06/11/23 [phone]

## 2023-05-31 NOTE — ED Provider Triage Note (Signed)
 Emergency Medicine Provider Triage Evaluation Note  Robyn Barker , a 48 y.o. female  was evaluated in triage.  Pt complains of palpitations and chest pressure.  Patient has a history of SVT and states that this felt similar.  Tried home metoprolol with no improvement.  Converted with adenosine to sinus rhythm.  Review of Systems  Positive: Chest pressure, palpitations Negative: None  Physical Exam  BP 120/82 (BP Location: Left Arm)   Pulse (!) 118   Temp 97.6 F (36.4 C) (Oral)   Resp 18   SpO2 99%  Gen:   Awake, no distress   Resp:  Normal effort  MSK:   Moves extremities without difficulty  Other:  None  Medical Decision Making  Medically screening exam initiated at 6:51 PM.  Appropriate orders placed.  Robyn Barker was informed that the remainder of the evaluation will be completed by another provider, this initial triage assessment does not replace that evaluation, and the importance of remaining in the ED until their evaluation is complete.  Appropriate labs ordered as well as IV fluids.  Appears to have converted back to sinus rhythm on repeat EKG   Durwin Glaze, MD 05/31/23 [phone]

## 2023-05-31 NOTE — ED Triage Notes (Signed)
 Pt BIB by EMS from work. Pt reports laughing with coworker with sudden onset of palpitations. SVT noted on EMS arrival, converted with 6 mg Adenosine. Similar incident x 3 months ago. Reports seeing cardiologist but never following-up about procedure. Alert and oriented on arrival. Denies any CP or SHOB.

## 2023-06-04 NOTE — Progress Notes (Unsigned)
 Electrophysiology Office Note:   Date:  06/05/2023  ID:  Robyn Barker, DOB 09-Jan-1976, MRN [id number]  Primary Cardiologist: Yates Decamp, MD Electrophysiologist: Nobie Putnam, MD      History of Present Illness:   Robyn Barker is a 48 y.o. female with h/o type 2 diabetes, hyperlipidemia who is being seen today for evaluation of SVT.  Discussed the use of AI scribe software for clinical note transcription with the patient, who gave verbal consent to proceed.  History of Present Illness Patient had an episode of SVT in September 2024.  She reportedly had heart rate of 220 bpm with a blood pressure of 80/40 mmHg.  She was given IV adenosine 6 mg and tachycardia terminated.  She had another episode on 05/31/2023.  EMS was called.  She was given IV adenosine 6 mg and SVT terminated.  The onset is sudden, often triggered by laughing, accompanied by chest pain and a sensation of heart pounding in the middle of her chest. These episodes necessitate immediate sitting down. In September, she sought emergency medical services and was administered an intravenous medication that resolved the episode. A similar episode occurred last Thursday, during which she attempted to use metoprolol as prescribed, but it was ineffective in controlling her heart rate, leading her to call EMS again. She mentions experiencing occasional heart fluttering outside of these episodes, but it is not as severe. Her current medication includes metoprolol, which she took during the last episode, but it did not prevent the recurrence of symptoms. 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  Review of systems complete and found to be negative unless listed in HPI.   EP Information / Studies Reviewed:    EKG is not ordered today. EKG from 05/31/23 reviewed which showed sinus tachycardia with no obvious pre-excitation.      EMS Strips  11/01/22:   Nuclear Stress 12/29/22:    The study is normal. The study is low risk.   No ST deviation was noted.   LV perfusion is normal. There is no evidence of ischemia. There is no evidence of infarction.   Left ventricular function is normal. Nuclear stress EF: 57%. The left ventricular ejection fraction is normal (55-65%). End diastolic cavity size is normal. End systolic cavity size is normal.   Prior study not available for comparison.  Echo 12/29/22:  1. Left ventricular ejection fraction, by estimation, is 60 to 65%. The  left ventricle has normal function. The left ventricle has no regional  wall motion abnormalities. Left ventricular diastolic parameters were  normal. The average left ventricular  global longitudinal strain is -19.9 %. The global longitudinal strain is  normal.   2. Right ventricular systolic function is normal. The right ventricular  size is normal.   3. The mitral valve is abnormal. Trivial mitral valve regurgitation. No  evidence of mitral stenosis.   4. The aortic valve is tricuspid. Aortic valve regurgitation is not  visualized. No aortic stenosis is present.   5. The inferior vena cava is normal in size with greater than 50%  respiratory variability, suggesting right atrial pressure of 3 mmHg.        Physical Exam:   VS:  BP 110/60 (BP Location: Left Arm)   Pulse 86   Ht 5' 1.5" (1.562 m)   Wt 208 lb 9.6 oz (94.6 kg)   SpO2 97%   BMI 38.78 kg/m  Wt Readings from Last 3 Encounters:  06/05/23 208 lb 9.6 oz (94.6 kg)  01/10/23 206 lb 12.8 oz (93.8 kg)  12/06/22 207 lb 9.6 oz (94.2 kg)     GEN: Well nourished, well developed in no acute distress NECK: No JVD CARDIAC: Normal rate, regular rhythm RESPIRATORY:  Clear to auscultation without rales, wheezing or rhonchi  ABDOMEN: Soft, non-distended EXTREMITIES:  No edema; No deformity   ASSESSMENT AND PLAN:    #. Symptomatic SVT: She has had sustained episodes requiring presentation to the  ED.  Both occasions terminated with adenosine. #. Palpitations -Risk, benefits, and alternatives to EP study and radiofrequency ablation for SVT were also discussed in detail today. These risks include but are not limited to complete heart block, stroke, bleeding, vascular damage, tamponade, perforation, and death. The patient understands these risk and wishes to think about her options.  - She has as needed metoprolol tartrate, will increase to 25mg . If she chooses medical therapy, then we will transition to metop XL 25mg  BID.   #. Hyperlipidemia:  - Continue atorvastatin 10mg  once daily.  Signed, Ardeen Kohler, MD

## 2023-06-05 ENCOUNTER — Telehealth: Payer: Self-pay | Admitting: Cardiology

## 2023-06-05 ENCOUNTER — Ambulatory Visit: Attending: Cardiology | Admitting: Cardiology

## 2023-06-05 VITALS — BP 110/60 | HR 86 | Ht 61.5 in | Wt 208.6 lb

## 2023-06-05 DIAGNOSIS — I471 Supraventricular tachycardia, unspecified: Secondary | ICD-10-CM

## 2023-06-05 DIAGNOSIS — R002 Palpitations: Secondary | ICD-10-CM | POA: Diagnosis not present

## 2023-06-05 DIAGNOSIS — E785 Hyperlipidemia, unspecified: Secondary | ICD-10-CM

## 2023-06-05 MED ORDER — METOPROLOL TARTRATE 25 MG PO TABS: 25.0000 mg | ORAL_TABLET | ORAL | 3 refills | Status: AC | PRN

## 2023-06-05 NOTE — Patient Instructions (Addendum)
 Medication Instructions:  Your physician has recommended you make the following change in your medication:  1) INCREASE Lopressor (metoprolol tartrate) to 25 mg *If you need a refill on your cardiac medications before your next appointment, please call your pharmacy*  Testing/Procedures: EP Study/Ablation Your physician has recommended that you have an ablation. Catheter ablation is a medical procedure used to treat some cardiac arrhythmias (irregular heartbeats). During catheter ablation, a long, thin, flexible tube is put into a blood vessel in your groin (upper thigh), or neck. This tube is called an ablation catheter. It is then guided to your heart through the blood vessel. Radio frequency waves destroy small areas of heart tissue where abnormal heartbeats may cause an arrhythmia to start.  Call us  if you decide that you want to schedule an ablation  Follow-Up: At [facility], you and your health needs are our priority.  As part of our continuing mission to provide you with exceptional heart care, our providers are all part of one team.  This team includes your primary Cardiologist (physician) and Advanced Practice Providers or APPs (Physician Assistants and Nurse Practitioners) who all work together to provide you with the care you need, when you need it.

## 2023-06-05 NOTE — Telephone Encounter (Signed)
 Patient stated she would like to have the procedure as discussed at today's appointment and wants a call back to get the procedure scheduled.

## 2023-06-05 NOTE — Telephone Encounter (Signed)
 Spoke with the patient and scheduled her for an EP Study/SVT ablation.

## 2023-06-29 ENCOUNTER — Telehealth (HOSPITAL_COMMUNITY): Payer: Self-pay

## 2023-06-29 DIAGNOSIS — Z01812 Encounter for preprocedural laboratory examination: Secondary | ICD-10-CM

## 2023-06-29 DIAGNOSIS — I471 Supraventricular tachycardia, unspecified: Secondary | ICD-10-CM

## 2023-06-29 NOTE — Telephone Encounter (Signed)
 Spoke with patient to complete pre-procedure call.     New medical conditions? No Recent hospitalizations or surgeries? No Started any new medications? No Patient made aware to contact office to inform of any new medications started. Any changes in activities of daily living? No  Pre-procedure testing scheduled: lab work order placed/released- Patient will get labs completed by May 23.  Medications to hold: Semaglutide  for 7 days prior to procedure. Your last dose will be on May 22; Hold Metoprolol  for 3 days prior. Your last dose will be on May 31.  Confirmed patient is scheduled for SVT Ablation on Wednesday, June 4 with Dr. Clinton Danas. Instructed patient to arrive at the Main Entrance A at [facility]: [address] [place], [place] [address] and check in at Admitting at 9:00 AM.  Advised of plan to go home the same day and will only stay overnight if medically necessary. You MUST have a responsible adult to drive you home and MUST be with you the first 24 hours after you arrive home or your procedure could be cancelled.  Patient verbalized understanding to information provided and is agreeable to proceed with procedure.

## 2023-07-03 ENCOUNTER — Other Ambulatory Visit: Payer: Self-pay | Admitting: Family Medicine

## 2023-07-03 ENCOUNTER — Telehealth: Payer: Self-pay

## 2023-07-03 ENCOUNTER — Other Ambulatory Visit (HOSPITAL_COMMUNITY): Payer: Self-pay

## 2023-07-03 NOTE — Telephone Encounter (Signed)
 Pharmacy Patient Advocate Encounter   Received notification from Patient Pharmacy that prior authorization for Ozempic  1 is required/requested.   Insurance verification completed.   The patient is insured through [facility] .   Per test claim: PA required; PA submitted to above mentioned insurance via CoverMyMeds Key/confirmation #/EOC [id number] Status is pending

## 2023-07-03 NOTE — Telephone Encounter (Signed)
 Last office visit 11/202/024 for CPE.  Last refilled 04/10/2023 for #12 with no refills.  Vit D level 01/10/23 which was low at 12.50 ng/ml.  Next Appt: No future appointments.

## 2023-07-06 ENCOUNTER — Other Ambulatory Visit (HOSPITAL_COMMUNITY): Payer: Self-pay

## 2023-07-06 ENCOUNTER — Telehealth: Payer: Self-pay

## 2023-07-06 NOTE — Telephone Encounter (Signed)
 Pharmacy Patient Advocate Encounter  Received notification from [facility] that Prior Authorization for OZEMPIC  1 MG/DOSE (4 MG/3 ML) has been CANCELLED by the processor we sent it to: Resubmitted through Prompt PA to [facility]   New Encounter has been or will be created for follow up. For additional info see Pharmacy Prior Auth telephone encounter from 07/06/23.

## 2023-07-06 NOTE — Telephone Encounter (Signed)
 Pharmacy Patient Advocate Encounter   Received notification from CoverMyMeds that prior authorization for OZEMPIC  1 MG/DOSE (4 MG/3 ML) is required/requested.   Insurance verification completed.   The patient is insured through [facility] .   Per test claim: PA required; PA submitted to above mentioned insurance via Prompt PA Key/confirmation #/EOC [id number] Status is pending

## 2023-07-09 ENCOUNTER — Other Ambulatory Visit (HOSPITAL_COMMUNITY): Payer: Self-pay

## 2023-07-09 NOTE — Telephone Encounter (Signed)
 Pharmacy Patient Advocate Encounter  Received notification from [employer] that Prior Authorization for OZEMPIC  1 MG/DOSE (4 MG/3 ML)   has been APPROVED from 07/08/23 to 07/06/24. Ran test claim, Copay is $74.99. This test claim was processed through [facility]- copay amounts may vary at other pharmacies due to pharmacy/plan contracts, or as the patient moves through the different stages of their insurance plan.   PA #/Case ID/Reference #:  [id number]

## 2023-07-11 ENCOUNTER — Ambulatory Visit: Payer: Self-pay | Admitting: Cardiology

## 2023-07-11 ENCOUNTER — Other Ambulatory Visit: Payer: Self-pay | Admitting: Family Medicine

## 2023-07-11 LAB — BASIC METABOLIC PANEL WITH GFR
BUN/Creatinine Ratio: 10 (ref 9–23)
BUN: 9 mg/dL (ref 6–24)
CO2: 21 mmol/L (ref 20–29)
Calcium: 9.3 mg/dL (ref 8.7–10.2)
Chloride: 103 mmol/L (ref 96–106)
Creatinine, Ser: 0.87 mg/dL (ref 0.57–1.00)
Glucose: 103 mg/dL — ABNORMAL HIGH (ref 70–99)
Potassium: 4.4 mmol/L (ref 3.5–5.2)
Sodium: 140 mmol/L (ref 134–144)
eGFR: 83 mL/min/{1.73_m2} (ref >59)

## 2023-07-11 LAB — CBC
Hematocrit: 32 % — ABNORMAL LOW (ref 34.0–46.6)
Hemoglobin: 10.1 g/dL — ABNORMAL LOW (ref 11.1–15.9)
MCH: 24.8 pg — ABNORMAL LOW (ref 26.6–33.0)
MCHC: 31.6 g/dL (ref 31.5–35.7)
MCV: 78 fL — ABNORMAL LOW (ref 79–97)
Platelets: 418 10*3/uL (ref 150–450)
RBC: 4.08 x10E6/uL (ref 3.77–5.28)
RDW: 15.8 % — ABNORMAL HIGH (ref 11.7–15.4)
WBC: 8.5 10*3/uL (ref 3.4–10.8)

## 2023-07-11 NOTE — Telephone Encounter (Signed)
 Last office visit 01/10/2023 for CPE.  Last refilled 04/25/2022 for #30 with 3 refills.  Next appt: No future appointments.

## 2023-07-15 NOTE — Pre-Procedure Instructions (Signed)
 Patient is scheduled for procedure on 6/4 with anesthesia.  Anesthesia requires Ozempic  to be held for 7 days prior to procedure.  She states her last dose was 5/23 and will not take again till after procedure.

## 2023-07-19 ENCOUNTER — Telehealth: Payer: Self-pay

## 2023-07-19 ENCOUNTER — Telehealth (HOSPITAL_COMMUNITY): Payer: Self-pay

## 2023-07-19 NOTE — Telephone Encounter (Signed)
 Called patient and confirmed new procedure time. The patient understood her ablation will still be on 07/25/2023, but with an arrival time of 1100 for a 1300 procedure. She was grateful for call and agreed with plan.

## 2023-07-19 NOTE — Telephone Encounter (Signed)
 Spoke with patient to discuss upcoming procedure.   Labs: completed.   Any recent signs of acute illness or been started on antibiotics? No Any new medications started? No Any medications to hold? Metoprolol  3 days- patient states she has not taken medication. Medication instructions:  On the morning of your procedure DO NOT take any medication.or the procedure may be rescheduled. Nothing to eat or drink after midnight prior to your procedure.  Confirmed patient is scheduled for SVT Ablation on Wednesday, June 4 with Dr. Clinton Danas. Instructed patient to arrive at the Main Entrance A at [facility]: [address] [place], [place] [address] and check in at Admitting at 11:00 AM.  Advised of plan to go home the same day and will only stay overnight if medically necessary. You MUST have a responsible adult to drive you home and MUST be with you the first 24 hours after you arrive home or your procedure could be cancelled.  Patient verbalized understanding to all instructions provided and agreed to proceed with procedure.

## 2023-07-24 ENCOUNTER — Telehealth: Payer: Self-pay

## 2023-07-24 NOTE — Telephone Encounter (Signed)
 Pt notified that her Insurance did not improve her SVT Ablation for tomorrow with Dr. Daneil Dunker. She is very upset that she was notified last minute when it's been schedule for a while.   She refused to reschedule.  She will need a work note stating that she was notified at 4:30 pm the day before her procedure so she doesn't get in trouble with her employer.

## 2023-07-24 NOTE — Pre-Procedure Instructions (Signed)
 Instructed patient on the following items: Arrival time 1030 Nothing to eat or drink after midnight No meds AM of procedure Responsible person to drive you home and stay with you for 24 hrs  Last Ozempic  was 5/22

## 2023-07-25 ENCOUNTER — Ambulatory Visit (HOSPITAL_COMMUNITY): Admission: RE | Admit: 2023-07-25 | Source: Ambulatory Visit | Admitting: Cardiology

## 2023-07-25 ENCOUNTER — Encounter (HOSPITAL_COMMUNITY): Admission: RE | Payer: Self-pay | Source: Ambulatory Visit

## 2023-07-25 SURGERY — SVT ABLATION
Anesthesia: General

## 2023-08-22 ENCOUNTER — Ambulatory Visit: Attending: Cardiology | Admitting: Cardiology

## 2023-08-22 NOTE — Progress Notes (Deleted)
 Electrophysiology Clinic Note    Date:  08/22/2023  Patient ID:  Robyn Barker, Robyn Barker [birth year], MRN [id number] PCP:  Avelina Greig BRAVO, MD  Cardiologist:  Gordy Bergamo, MD Electrophysiologist: Fonda Kitty, MD  ***refresh  Discussed the use of AI scribe software for clinical note transcription with the patient, who gave verbal consent to proceed.   Patient Profile    Chief Complaint: SVT follow-up  History of Present Illness: Robyn Barker is a 48 y.o. female with PMH notable for SVT, T2DM, HLD; seen today for Fonda Kitty, MD for routine electrophysiology followup.   She last saw Dr. Kitty 05/2023 for initial evaluation of her SVT.  She had had several episodes that would only terminate with adenosine.  He increased her as needed metoprolol  to 25 mg and scheduled an SVT ablation.  Unfortunately her SVT ablation scheduled for 07/25/2023 had to be canceled due to lack of insurance approval.  On follow-up today ***  Since last being seen in our clinic the patient reports doing ***.  she denies chest pain, palpitations, dyspnea, PND, orthopnea, nausea, vomiting, dizziness, syncope, edema, weight gain, or early satiety.      Arrhythmia/Device History No specialty comments available.    ROS:  Please see the history of present illness. All other systems are reviewed and otherwise negative.    Physical Exam    VS:  There were no vitals taken for this visit. BMI: There is no height or weight on file to calculate BMI.      Wt Readings from Last 3 Encounters:  06/05/23 208 lb 9.6 oz (94.6 kg)  01/10/23 206 lb 12.8 oz (93.8 kg)  12/06/22 207 lb 9.6 oz (94.2 kg)     GEN- The patient is well appearing, alert and oriented x 3 today.   Lungs- Clear to ausculation bilaterally, normal work of breathing.  Heart- {Blank single:19197::Regular,Irregularly irregular} rate and rhythm, no murmurs, rubs or gallops Extremities- {EDEMA LEVEL:28147::No} peripheral edema, warm,  dry    Studies Reviewed   Previous EP, cardiology notes.    EKG is ordered. Personal review of EKG from today shows:  ***        NM perfusion, 12/29/2022   The study is normal. The study is low risk.   No ST deviation was noted.   LV perfusion is normal. There is no evidence of ischemia. There is no evidence of infarction.   Left ventricular function is normal. Nuclear stress EF: 57%. The left ventricular ejection fraction is normal (55-65%). End diastolic cavity size is normal. End systolic cavity size is normal.   Prior study not available for comparison.  TTE, 12/29/2022  1. Left ventricular ejection fraction, by estimation, is 60 to 65%. The left ventricle has normal function. The left ventricle has no regional wall motion abnormalities. Left ventricular diastolic parameters were normal. The average left ventricular global longitudinal strain is -19.9 %. The global longitudinal strain is normal.   2. Right ventricular systolic function is normal. The right ventricular  size is normal.   3. The mitral valve is abnormal. Trivial mitral valve regurgitation. No  evidence of mitral stenosis.   4. The aortic valve is tricuspid. Aortic valve regurgitation is not  visualized. No aortic stenosis is present.   5. The inferior vena cava is normal in size with greater than 50%  respiratory variability, suggesting right atrial pressure of 3 mmHg.    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 stress test, cath, DCCV, TEE, etc)?   Press F2        :[id number]}   Current medicines are reviewed at length with the patient today.   The patient {ACTIONS; HAS/DOES NOT HAVE:19233} concerns regarding her medicines.  The following changes were made today:  {NONE DEFAULTED:18576}  Labs/ tests ordered today include: *** No orders of the defined types were placed in this encounter.    Disposition:  Follow up with {EPMDS:28135::EP Team} or EP APP {EPFOLLOW UP:28173}   Signed, Chantal Needle, NP  08/22/23  7:59 AM  Electrophysiology [facility]

## 2023-12-24 ENCOUNTER — Ambulatory Visit: Payer: Self-pay

## 2023-12-24 NOTE — Telephone Encounter (Signed)
 FYI Only or Action Required?: FYI only for provider: Pt will call back if itchiness and sore throat do not resolved with home care..  Patient was last seen in primary care on 01/10/2023 by Avelina Greig BRAVO, MD.  Called Nurse Triage reporting Vaginal Itching. Pt used tampons with last period - she thinks this may be part of the problem.  Symptoms began several days ago.  Interventions attempted: Nothing.  Symptoms are: unchanged.  Triage Disposition: Home Care (overriding See PCP Within 2 Weeks)  Patient/caregiver understands and will follow disposition?: Yes                          Reason for Triage: Patient complaining of vaginal itching and left side throat soreness.   [phone]   Reason for Disposition  All other vaginal symptoms  (Exceptions: Feels like prior yeast infection, minor abrasion, mild rash < 24 hour duration, mild itching, vaginal dryness during sex.)  Answer Assessment - Initial Assessment Questions 1. SYMPTOM: What's the main symptom you're concerned about? (e.g., pain, itching, dryness)     itching 2. LOCATION: Where is the  itching located? (e.g., inside/outside, left/right)     outside 3. ONSET: When did the  itching  start?     3 days ago 4. PAIN: Is there any pain? If Yes, ask: How bad is it? (Scale: 1-10; mild, moderate, severe)     no 5. ITCHING: Is there any itching? If Yes, ask: How bad is it? (Scale: 1-10; mild, moderate, severe)     moderate 6. CAUSE: What do you think is causing the discharge? Have you had the same problem before? What happened then?     No discharge 7. OTHER SYMPTOMS: Do you have any other symptoms? (e.g., fever, itching, vaginal bleeding, pain with urination, injury to genital area, vaginal foreign body)     Sore throat  Protocols used: Vaginal Symptoms-A-AH

## 2023-12-25 NOTE — Telephone Encounter (Signed)
 Noted, if patient's symptoms continuing she needs an appointment to be seen.

## 2023-12-26 ENCOUNTER — Ambulatory Visit: Admitting: Family Medicine

## 2024-01-23 ENCOUNTER — Emergency Department
Admission: EM | Admit: 2024-01-23 | Discharge: 2024-01-23 | Disposition: A | Discharge status: Home / Self Care | Attending: Emergency Medicine | Admitting: Emergency Medicine

## 2024-01-23 ENCOUNTER — Other Ambulatory Visit: Payer: Self-pay

## 2024-01-23 DIAGNOSIS — E119 Type 2 diabetes mellitus without complications: Secondary | ICD-10-CM | POA: Insufficient documentation

## 2024-01-23 DIAGNOSIS — J45909 Unspecified asthma, uncomplicated: Secondary | ICD-10-CM | POA: Diagnosis not present

## 2024-01-23 DIAGNOSIS — R110 Nausea: Secondary | ICD-10-CM | POA: Diagnosis present

## 2024-01-23 LAB — COMPREHENSIVE METABOLIC PANEL WITH GFR
ALT: 25 U/L (ref 0–44)
AST: 28 U/L (ref 15–41)
Albumin: 4.4 g/dL (ref 3.5–5.0)
Alkaline Phosphatase: 64 U/L (ref 38–126)
Anion gap: 12 (ref 5–15)
BUN: 10 mg/dL (ref 6–20)
CO2: 25 mmol/L (ref 22–32)
Calcium: 9.2 mg/dL (ref 8.9–10.3)
Chloride: 100 mmol/L (ref 98–111)
Creatinine, Ser: 0.8 mg/dL (ref 0.44–1.00)
GFR, Estimated: >60 mL/min (ref >60)
Glucose, Bld: 180 mg/dL — ABNORMAL HIGH (ref 70–99)
Potassium: 4 mmol/L (ref 3.5–5.1)
Sodium: 137 mmol/L (ref 135–145)
Total Bilirubin: <0.2 mg/dL (ref 0.0–1.2)
Total Protein: 7.9 g/dL (ref 6.5–8.1)

## 2024-01-23 LAB — CBC
HCT: 32.8 % — ABNORMAL LOW (ref 36.0–46.0)
Hemoglobin: 9.7 g/dL — ABNORMAL LOW (ref 12.0–15.0)
MCH: 21.6 pg — ABNORMAL LOW (ref 26.0–34.0)
MCHC: 29.6 g/dL — ABNORMAL LOW (ref 30.0–36.0)
MCV: 72.9 fL — ABNORMAL LOW (ref 80.0–100.0)
Platelets: 440 K/uL — ABNORMAL HIGH (ref 150–400)
RBC: 4.5 MIL/uL (ref 3.87–5.11)
RDW: 16.4 % — ABNORMAL HIGH (ref 11.5–15.5)
WBC: 10.8 K/uL — ABNORMAL HIGH (ref 4.0–10.5)
nRBC: 0 % (ref 0.0–0.2)

## 2024-01-23 LAB — LIPASE, BLOOD: Lipase: 25 U/L (ref 11–51)

## 2024-01-23 MED ORDER — ACETAMINOPHEN 500 MG PO TABS
1000.0000 mg | ORAL_TABLET | Freq: Once | ORAL | Status: AC
  Administered 2024-01-23: 1000 mg via ORAL
  Filled 2024-01-23: qty 2

## 2024-01-23 MED ORDER — ONDANSETRON 4 MG PO TBDP
4.0000 mg | ORAL_TABLET | Freq: Once | ORAL | Status: AC
  Administered 2024-01-23: 4 mg via ORAL
  Filled 2024-01-23: qty 1

## 2024-01-23 MED ORDER — ONDANSETRON 4 MG PO TBDP: 4.0000 mg | ORAL_TABLET | Freq: Three times a day (TID) | ORAL | 0 refills | Status: AC | PRN

## 2024-01-23 NOTE — ED Triage Notes (Signed)
 Pt reports abd pain and nausea after eating cook out tonight. Pt reports she ate half of a chicken sandwich and the noted it to have mold on the back of the bread.

## 2024-01-23 NOTE — ED Provider Notes (Signed)
 [facility] Provider Note    Event Date/Time   First MD Initiated Contact with Patient 01/23/24 2145     (approximate)   History   Nausea   HPI  CORRIN SIELING is a 48 y.o. female who presents to the ED for evaluation of Nausea   I review a routine PCP visit from last year. Obese patient with history of SVT, HLD, asthma, DM on Ozempic .  Patient presents to the ED after feeling nauseous after eating cookout.  She reports eating her daughters spicy sandwich and she does not like spicy food.  Halfway through eating the sandwich she noted a small area of mold on the bread and so presents to the ED.  She has follow-up with her PCP tomorrow.  Was not sure if a good test for mold or if there was anything in particular she needed to do.  Some nausea that has since resolved, no abdominal pain or emesis.  Does not typically get postprandial nausea or pain   Physical Exam   Triage Vital Signs: ED Triage Vitals  Encounter Vitals Group     BP 01/23/24 2110 (!) 159/101     Girls Systolic BP Percentile --      Girls Diastolic BP Percentile --      Boys Systolic BP Percentile --      Boys Diastolic BP Percentile --      Pulse Rate 01/23/24 2110 (!) 105     Resp 01/23/24 2110 19     Temp 01/23/24 2110 98.5 F (36.9 C)     Temp Source 01/23/24 2110 Oral     SpO2 01/23/24 2110 100 %     Weight 01/23/24 2109 210 lb (95.3 kg)     Height 01/23/24 2109 5' 1 (1.549 m)     Head Circumference --      Peak Flow --      Pain Score 01/23/24 2109 5     Pain Loc --      Pain Education --      Exclude from Growth Chart --     Most recent vital signs: Vitals:   01/23/24 2110 01/23/24 2203  BP: (!) 159/101 (!) 147/83  Pulse: (!) 105 87  Resp: 19 17  Temp: 98.5 F (36.9 C) 98.4 F (36.9 C)  SpO2: 100% 99%    General: Awake, no distress.  Well-appearing CV:  Good peripheral perfusion.  Resp:  Normal effort.  Abd:  No distention.  Soft and nontender  throughout MSK:  No deformity noted.  Neuro:  No focal deficits appreciated. Other:     ED Results / Procedures / Treatments   Labs (all labs ordered are listed, but only abnormal results are displayed) Labs Reviewed  COMPREHENSIVE METABOLIC PANEL WITH GFR - Abnormal; Notable for the following components:      Result Value   Glucose, Bld 180 (*)    All other components within normal limits  CBC - Abnormal; Notable for the following components:   WBC 10.8 (*)    Hemoglobin 9.7 (*)    HCT 32.8 (*)    MCV 72.9 (*)    MCH 21.6 (*)    MCHC 29.6 (*)    RDW 16.4 (*)    Platelets 440 (*)    All other components within normal limits  LIPASE, BLOOD  URINALYSIS, ROUTINE W REFLEX MICROSCOPIC  POC URINE PREG, ED    EKG   RADIOLOGY   Official radiology report(s): No results  found.  PROCEDURES and INTERVENTIONS:  Procedures  Medications  acetaminophen  (TYLENOL ) tablet 1,000 mg (1,000 mg Oral Given 01/23/24 2202)  ondansetron  (ZOFRAN -ODT) disintegrating tablet 4 mg (4 mg Oral Given 01/23/24 2202)     IMPRESSION / MDM / ASSESSMENT AND PLAN / ED COURSE  I reviewed the triage vital signs and the nursing notes.  Differential diagnosis includes, but is not limited to, biliary colic, pancreatitis, foodborne illness  {Patient presents with symptoms of an acute illness or injury that is potentially life-threatening.  Patient presents after episode of nausea without evidence of acute pathology and suitable for outpatient management.  Chronic microcytic anemia, normal lipase and metabolic panel.  Nontender RUQ and I doubt biliary colic considering no consistent postprandial symptoms.  Fortuitously she has follow-up with her PCP tomorrow.  No barriers to outpatient management.  Discussed ED return precautions.      FINAL CLINICAL IMPRESSION(S) / ED DIAGNOSES   Final diagnoses:  Nausea without vomiting     Rx / DC Orders   ED Discharge Orders          Ordered    ondansetron   (ZOFRAN -ODT) 4 MG disintegrating tablet  Every 8 hours PRN        01/23/24 2152             Note:  This document was prepared using Dragon voice recognition software and may include unintentional dictation errors.   Claudene Rover, MD 01/23/24 2211

## 2024-01-24 ENCOUNTER — Encounter: Admitting: Family Medicine

## 2024-01-29 ENCOUNTER — Encounter: Admitting: Family Medicine

## 2024-02-08 ENCOUNTER — Encounter: Admitting: Family Medicine

## 2024-03-04 ENCOUNTER — Encounter: Admitting: Family Medicine

## 2024-03-07 ENCOUNTER — Encounter: Admitting: Family Medicine

## 2024-03-25 ENCOUNTER — Other Ambulatory Visit: Payer: Self-pay | Admitting: Family Medicine

## 2024-03-25 DIAGNOSIS — Z1231 Encounter for screening mammogram for malignant neoplasm of breast: Secondary | ICD-10-CM

## 2024-03-27 ENCOUNTER — Inpatient Hospital Stay: Admission: RE | Admit: 2024-03-27 | Discharge: 2024-03-27 | Attending: Family Medicine | Admitting: Family Medicine

## 2024-03-27 DIAGNOSIS — Z1231 Encounter for screening mammogram for malignant neoplasm of breast: Secondary | ICD-10-CM

## 2024-04-02 ENCOUNTER — Encounter: Admitting: Family Medicine
